# Patient Record
Sex: Male | Born: 1950 | Race: White | Hispanic: No | Marital: Married | Smoking: Former smoker
Health system: Southern US, Community
[De-identification: ages and names within clinical notes are randomized; demographics above are authoritative.]

## PROBLEM LIST (undated history)

## (undated) DIAGNOSIS — C3490 Malignant neoplasm of unspecified part of unspecified bronchus or lung: Secondary | ICD-10-CM

## (undated) DIAGNOSIS — I10 Essential (primary) hypertension: Secondary | ICD-10-CM

## (undated) DIAGNOSIS — Z7189 Other specified counseling: Secondary | ICD-10-CM

## (undated) DIAGNOSIS — E785 Hyperlipidemia, unspecified: Secondary | ICD-10-CM

## (undated) DIAGNOSIS — R7303 Prediabetes: Secondary | ICD-10-CM

## (undated) DIAGNOSIS — K219 Gastro-esophageal reflux disease without esophagitis: Secondary | ICD-10-CM

## (undated) DIAGNOSIS — R918 Other nonspecific abnormal finding of lung field: Secondary | ICD-10-CM

## (undated) HISTORY — PX: EYE SURGERY: SHX253

## (undated) HISTORY — DX: Prediabetes: R73.03

## (undated) HISTORY — DX: Gastro-esophageal reflux disease without esophagitis: K21.9

## (undated) HISTORY — DX: Other specified counseling: Z71.89

## (undated) HISTORY — DX: Essential (primary) hypertension: I10

## (undated) HISTORY — DX: Other nonspecific abnormal finding of lung field: R91.8

---

## 2017-07-31 DIAGNOSIS — E7800 Pure hypercholesterolemia, unspecified: Secondary | ICD-10-CM | POA: Insufficient documentation

## 2017-07-31 DIAGNOSIS — I10 Essential (primary) hypertension: Secondary | ICD-10-CM | POA: Insufficient documentation

## 2017-08-06 DIAGNOSIS — R7303 Prediabetes: Secondary | ICD-10-CM

## 2017-08-06 HISTORY — DX: Prediabetes: R73.03

## 2017-12-22 DIAGNOSIS — K219 Gastro-esophageal reflux disease without esophagitis: Secondary | ICD-10-CM | POA: Insufficient documentation

## 2017-12-22 DIAGNOSIS — Z01810 Encounter for preprocedural cardiovascular examination: Secondary | ICD-10-CM | POA: Insufficient documentation

## 2017-12-22 DIAGNOSIS — Z7185 Encounter for immunization safety counseling: Secondary | ICD-10-CM

## 2017-12-22 HISTORY — DX: Encounter for immunization safety counseling: Z71.85

## 2017-12-24 ENCOUNTER — Telehealth: Payer: Self-pay | Admitting: *Deleted

## 2017-12-24 DIAGNOSIS — Z87891 Personal history of nicotine dependence: Secondary | ICD-10-CM

## 2017-12-24 DIAGNOSIS — Z122 Encounter for screening for malignant neoplasm of respiratory organs: Secondary | ICD-10-CM

## 2017-12-24 NOTE — Telephone Encounter (Signed)
Received referral for initial lung cancer screening scan. Contacted patient and obtained smoking history,(former, quit 10/27/17, 50 pack year) as well as answering questions related to screening process. Patient denies signs of lung cancer such as weight loss or hemoptysis. Patient denies comorbidity that would prevent curative treatment if lung cancer were found. Patient is scheduled for shared decision making visit and CT scan on 01/12/18.

## 2017-12-25 DIAGNOSIS — C3490 Malignant neoplasm of unspecified part of unspecified bronchus or lung: Secondary | ICD-10-CM

## 2017-12-25 HISTORY — DX: Malignant neoplasm of unspecified part of unspecified bronchus or lung: C34.90

## 2018-01-12 ENCOUNTER — Ambulatory Visit
Admission: RE | Admit: 2018-01-12 | Discharge: 2018-01-12 | Disposition: A | Discharge status: Home / Self Care | Payer: BLUE CROSS/BLUE SHIELD | Source: Ambulatory Visit | Attending: Oncology | Admitting: Oncology

## 2018-01-12 ENCOUNTER — Inpatient Hospital Stay: Payer: BLUE CROSS/BLUE SHIELD | Attending: Oncology | Admitting: Oncology

## 2018-01-12 DIAGNOSIS — Z122 Encounter for screening for malignant neoplasm of respiratory organs: Secondary | ICD-10-CM | POA: Diagnosis present

## 2018-01-12 DIAGNOSIS — J438 Other emphysema: Secondary | ICD-10-CM | POA: Diagnosis not present

## 2018-01-12 DIAGNOSIS — I2510 Atherosclerotic heart disease of native coronary artery without angina pectoris: Secondary | ICD-10-CM | POA: Insufficient documentation

## 2018-01-12 DIAGNOSIS — I700 Atherosclerosis of aorta: Secondary | ICD-10-CM | POA: Insufficient documentation

## 2018-01-12 DIAGNOSIS — J439 Emphysema, unspecified: Secondary | ICD-10-CM | POA: Insufficient documentation

## 2018-01-12 DIAGNOSIS — R0602 Shortness of breath: Secondary | ICD-10-CM | POA: Insufficient documentation

## 2018-01-12 DIAGNOSIS — R918 Other nonspecific abnormal finding of lung field: Secondary | ICD-10-CM | POA: Insufficient documentation

## 2018-01-12 DIAGNOSIS — Z87891 Personal history of nicotine dependence: Secondary | ICD-10-CM | POA: Diagnosis present

## 2018-01-12 DIAGNOSIS — Z800 Family history of malignant neoplasm of digestive organs: Secondary | ICD-10-CM | POA: Insufficient documentation

## 2018-01-12 DIAGNOSIS — J432 Centrilobular emphysema: Secondary | ICD-10-CM | POA: Insufficient documentation

## 2018-01-12 DIAGNOSIS — K219 Gastro-esophageal reflux disease without esophagitis: Secondary | ICD-10-CM | POA: Insufficient documentation

## 2018-01-12 DIAGNOSIS — Z79899 Other long term (current) drug therapy: Secondary | ICD-10-CM | POA: Insufficient documentation

## 2018-01-12 DIAGNOSIS — R05 Cough: Secondary | ICD-10-CM | POA: Insufficient documentation

## 2018-01-12 DIAGNOSIS — I10 Essential (primary) hypertension: Secondary | ICD-10-CM | POA: Insufficient documentation

## 2018-01-12 NOTE — Progress Notes (Signed)
In accordance with CMS guidelines, patient has met eligibility criteria including age, absence of signs or symptoms of lung cancer.  Social History   Tobacco Use  . Smoking status: Not on file  Substance Use Topics  . Alcohol use: Not on file  . Drug use: Not on file     A shared decision-making session was conducted prior to the performance of CT scan. This includes one or more decision aids, includes benefits and harms of screening, follow-up diagnostic testing, over-diagnosis, false positive rate, and total radiation exposure.  Counseling on the importance of adherence to annual lung cancer LDCT screening, impact of co-morbidities, and ability or willingness to undergo diagnosis and treatment is imperative for compliance of the program.  Counseling on the importance of continued smoking cessation for former smokers; the importance of smoking cessation for current smokers, and information about tobacco cessation interventions have been given to patient including [place] and [place] quit [place] programs.  Written order for lung cancer screening with LDCT has been given to the patient and any and all questions have been answered to the best of my abilities.   Yearly follow up will be coordinated by Burgess Estelle, Thoracic Navigator.  Faythe Casa, NP 01/12/2018 9:46 AM

## 2018-01-13 ENCOUNTER — Other Ambulatory Visit: Payer: Self-pay | Admitting: *Deleted

## 2018-01-13 DIAGNOSIS — R918 Other nonspecific abnormal finding of lung field: Secondary | ICD-10-CM

## 2018-01-13 HISTORY — DX: Other nonspecific abnormal finding of lung field: R91.8

## 2018-01-14 ENCOUNTER — Encounter: Payer: Self-pay | Admitting: *Deleted

## 2018-01-14 ENCOUNTER — Other Ambulatory Visit: Payer: Self-pay | Admitting: *Deleted

## 2018-01-14 DIAGNOSIS — R911 Solitary pulmonary nodule: Secondary | ICD-10-CM

## 2018-01-14 NOTE — Progress Notes (Signed)
  Oncology Nurse Navigator Documentation  Navigator Location: [facility]Med Onc (01/14/18 1000) Referral date to RadOnc/MedOnc: 01/14/18 (01/14/18 1000) )Navigator Encounter Type: Introductory phone call (01/14/18 1000)   Abnormal Finding Date: 01/12/18 (01/14/18 1000)                   Treatment Phase: Abnormal Scans (01/14/18 1000) Barriers/Navigation Needs: Coordination of Care (01/14/18 1000)   Interventions: Coordination of Care (01/14/18 1000)   Coordination of Care: Appts (01/14/18 1000)        Acuity: Level 2 (01/14/18 1000)   Acuity Level 2: Initial guidance, education and coordination as needed;Educational needs;Assistance expediting appointments (01/14/18 1000)  phone call made to patient to introduce to navigator services and to give appt information that is scheduled on 3/22 at 9:45am. Pt instructed to arrive at the [facility] in [place] at 9:30am to register. Contact info given to patient and instructed to call with any further questions or needs. Pt verbalized understanding. Nothing further needed at this time.   Time Spent with Patient: 30 (01/14/18 1000)

## 2018-01-15 ENCOUNTER — Encounter: Payer: Self-pay | Admitting: Oncology

## 2018-01-15 ENCOUNTER — Encounter: Payer: Self-pay | Admitting: *Deleted

## 2018-01-15 ENCOUNTER — Inpatient Hospital Stay (HOSPITAL_BASED_OUTPATIENT_CLINIC_OR_DEPARTMENT_OTHER): Payer: BLUE CROSS/BLUE SHIELD | Admitting: Oncology

## 2018-01-15 ENCOUNTER — Other Ambulatory Visit: Payer: Self-pay

## 2018-01-15 VITALS — BP 121/74 | HR 75 | Temp 96.3°F | Resp 18 | Ht 68.5 in | Wt 158.8 lb

## 2018-01-15 DIAGNOSIS — Z79899 Other long term (current) drug therapy: Secondary | ICD-10-CM | POA: Diagnosis not present

## 2018-01-15 DIAGNOSIS — Z800 Family history of malignant neoplasm of digestive organs: Secondary | ICD-10-CM

## 2018-01-15 DIAGNOSIS — R0602 Shortness of breath: Secondary | ICD-10-CM

## 2018-01-15 DIAGNOSIS — R918 Other nonspecific abnormal finding of lung field: Secondary | ICD-10-CM

## 2018-01-15 DIAGNOSIS — J439 Emphysema, unspecified: Secondary | ICD-10-CM | POA: Diagnosis not present

## 2018-01-15 DIAGNOSIS — K219 Gastro-esophageal reflux disease without esophagitis: Secondary | ICD-10-CM

## 2018-01-15 DIAGNOSIS — Z87891 Personal history of nicotine dependence: Secondary | ICD-10-CM | POA: Diagnosis not present

## 2018-01-15 DIAGNOSIS — I10 Essential (primary) hypertension: Secondary | ICD-10-CM | POA: Diagnosis not present

## 2018-01-15 DIAGNOSIS — R05 Cough: Secondary | ICD-10-CM | POA: Diagnosis not present

## 2018-01-15 NOTE — Progress Notes (Signed)
Here for new pt evaluation. Pt has rarely seen a Dr over the years  He stated.

## 2018-01-15 NOTE — Progress Notes (Signed)
Hematology/Oncology Consult note [facility] [phone] [phone]   Patient Care Team: Dion Body, MD as PCP - General (Family Medicine) Telford Nab, RN as Registered Nurse  REFERRING PROVIDER: Dion Body, MD  Lung [place] CHIEF COMPLAINTS/PURPOSE OF CONSULTATION:  Evaluation of lung mass  HISTORY OF PRESENTING ILLNESS:  Joseph Barron is a  67 y.o.  male with PMH listed below who was referred to me for evaluation of lung mass.  Patient had CT chest lung cancer screening done on January 12, 2018 which showed there is a spiculated nodule with a volume derived mean diameter of 15.2 mm, which abuts the pleural surface, highly concerning for primary bronchogenic neoplasm.  He is former smoker, quit recently. 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  Reports mild shortness of breath with climbing stairs otherwise feeling well.  He has a chronic cough productive with clear sputum.  Denies any weight loss, headache, abdominal pain, lower extremity swelling.  Today he is accompanied by his daughter to clinic.  Patient's case was discussed on tumor conference on January 14, 2018.  Given his heavy smoking history/COPD/emphysema, risks excised for biopsy. Consensus decision was to obtain a PET scan, pulmonary lung function testing.  If his lung mass is hypermetabolic on PET scan and no other involvement, he will have surgical evaluation regarding possibilities of resection as well as obtaining diagnosis.   Review of Systems  Constitutional: Negative for chills, fever, malaise/fatigue and weight loss.  HENT: Negative for nosebleeds.   Eyes: Negative for pain.  Respiratory: Negative for cough and sputum production.   Cardiovascular: Positive for orthopnea and claudication.  Gastrointestinal: Positive for heartburn. Negative for abdominal pain and  vomiting.  Genitourinary: Negative for dysuria.  Musculoskeletal: Negative for myalgias and neck pain.  Skin: Negative for rash.  Neurological: Negative for dizziness, sensory change and headaches.  Endo/Heme/Allergies: Does not bruise/bleed easily.  Psychiatric/Behavioral: Negative for depression.    MEDICAL HISTORY:  Past Medical History:  Diagnosis Date  . GERD (gastroesophageal reflux disease)   . Hypertension     SURGICAL HISTORY: History reviewed. No pertinent surgical history.  SOCIAL HISTORY: Social History   Socioeconomic History  . Marital status: Married    Spouse name: Not on file  . Number of children: Not on file  . Years of education: Not on file  . Highest education level: Not on file  Occupational History  . Not on file  Social Needs  . Financial resource strain: Not on file  . Food insecurity:    Worry: Not on file    Inability: Not on file  . Transportation needs:    Medical: Not on file    Non-medical: Not on file  Tobacco Use  . Smoking status: Former Smoker    Packs/day: 1.00    Years: 50.00    Pack years: 50.00    Types: Cigarettes    Last attempt to quit: 11/17/2017    Years since quitting: 0.1  . Smokeless tobacco: Former Systems developer    Types: [place] date: 01/15/1989  Substance and Sexual Activity  . Alcohol use: Yes    Comment: used to drink 1/5 th liquor x 2 y- now occasional beer- socially   . Drug use: Never  . Sexual activity: Yes  Lifestyle  . Physical activity:    Days per week: Not on file  Minutes per session: Not on file  . Stress: Not on file  Relationships  . Social connections:    Talks on phone: Not on file    Gets together: Not on file    Attends religious service: Not on file    Active member of club or organization: Not on file    Attends meetings of clubs or organizations: Not on file    Relationship status: Not on file  . Intimate partner violence:    Fear of current or ex partner: Not on file     Emotionally abused: Not on file    Physically abused: Not on file    Forced sexual activity: Not on file  Other Topics Concern  . Not on file  Social History Narrative  . Not on file    FAMILY HISTORY: Family History  Problem Relation Age of Onset  . Arthritis Mother   . COPD Father   . Coronary artery disease Father   . Transient ischemic attack Father   . Alcoholism Father   . Bowel Disease Father   . COPD Sister   . Prostatitis Brother   . Thyroid disease Sister   . Thyroid disease Sister   . Liver cancer Brother   . Diabetes Brother     ALLERGIES:  has No Known Allergies.  MEDICATIONS:  Current Outpatient Medications  Medication Sig Dispense Refill  . atorvastatin (LIPITOR) 10 MG tablet Take 10 mg by mouth daily at 6 PM.     . lisinopril (PRINIVIL,ZESTRIL) 20 MG tablet Take 20 mg by mouth daily.     Marland Kitchen omeprazole (PRILOSEC) 20 MG capsule Take 20 mg by mouth daily.     . varenicline (CHANTIX CONTINUING MONTH PAK) 1 MG tablet Take by mouth.     No current facility-administered medications for this visit.      PHYSICAL EXAMINATION: ECOG PERFORMANCE STATUS: 0 - Asymptomatic Vitals:   01/15/18 0948  BP: 121/74  Pulse: 75  Resp: 18  Temp: (!) 96.3 F (35.7 C)   Filed Weights   01/15/18 0948  Weight: 158 lb 12.8 oz (72 kg)    Physical Exam  Constitutional: He is oriented to person, place, and time and well-developed, well-nourished, and in no distress. No distress.  HENT:  Head: Normocephalic and atraumatic.  Mouth/Throat: No oropharyngeal exudate.  Eyes: Pupils are equal, round, and reactive to light. EOM are normal. Left eye exhibits no discharge.  Neck: Normal range of motion. Neck supple.  Cardiovascular: Normal rate and regular rhythm.  No murmur heard. Pulmonary/Chest: Effort normal.  Decreased breath sound bilaterally.  Abdominal: Bowel sounds are normal. He exhibits no distension.  Musculoskeletal: He exhibits no edema.  Lymphadenopathy:    He  has no cervical adenopathy.  Neurological: He is alert and oriented to person, place, and time. No cranial nerve deficit.  Skin: Skin is warm and dry.  Psychiatric: Affect and judgment normal.     LABORATORY DATA:  I have reviewed the data as listed No results found for: WBC, HGB, HCT, MCV, PLT No results for input(s): NA, K, CL, CO2, GLUCOSE, BUN, CREATININE, CALCIUM, GFRNONAA, GFRAA, PROT, ALBUMIN, AST, ALT, ALKPHOS, BILITOT, BILIDIR, IBILI in the last 8760 hours.     ASSESSMENT & PLAN:  1. Lung mass    Image results were discussed with patient and his daughter.  Discuss about the rationale of obtaining a PET scan in the pulmonary lung function.  Refer to Dr. Genevive Bi for evaluation of surgical resection. Encourage patient  to continue smoke cessation. All questions were answered. The patient knows to call the clinic with any problems questions or concerns.  Return of visit: To be determined depending on image results.  RN navigator will coordinate. Thank you for this kind referral and the opportunity to participate in the care of this patient. A copy of today's note is routed to referring provider    Earlie Server, MD, PhD Hematology Oncology [facility] Pager- [phone] 01/15/2018

## 2018-01-15 NOTE — Progress Notes (Signed)
  Oncology Nurse Navigator Documentation  Navigator Location: [facility]-Med Onc (01/15/18 1400)   )Navigator Encounter Type: Initial MedOnc (01/15/18 1400)                       Treatment Phase: Abnormal Scans (01/15/18 1400) Barriers/Navigation Needs: Coordination of Care (01/15/18 1400)   Interventions: Coordination of Care (01/15/18 1400)   Coordination of Care: Appts;Radiology (01/15/18 1400)         met with patient during initial med-onc consultation with Dr. Tasia Catchings to review results of recent low-dose CT scan and to discuss next steps. All questions answered at the time of visit. Reviewed upcoming appts with patient. Contact info given and instructed to call with any further questions or needs. Pt verbalized understanding. Nothing further needed.         Time Spent with Patient: 60 (01/15/18 1400)

## 2018-01-19 ENCOUNTER — Ambulatory Visit: Payer: BLUE CROSS/BLUE SHIELD | Attending: Oncology

## 2018-01-19 DIAGNOSIS — R918 Other nonspecific abnormal finding of lung field: Secondary | ICD-10-CM

## 2018-01-19 DIAGNOSIS — J449 Chronic obstructive pulmonary disease, unspecified: Secondary | ICD-10-CM | POA: Diagnosis present

## 2018-01-19 LAB — BLOOD GAS, ARTERIAL
Acid-Base Excess: 0.7 mmol/L (ref 0.0–2.0)
Bicarbonate: 26 mmol/L (ref 20.0–28.0)
FIO2: 0.21
O2 SAT: 95.1 %
PATIENT TEMPERATURE: 37
pCO2 arterial: 43 mmHg (ref 32.0–48.0)
pH, Arterial: 7.39 (ref 7.350–7.450)
pO2, Arterial: 77 mmHg — ABNORMAL LOW (ref 83.0–108.0)

## 2018-01-20 ENCOUNTER — Ambulatory Visit
Admission: RE | Admit: 2018-01-20 | Discharge: 2018-01-20 | Disposition: A | Discharge status: Home / Self Care | Payer: BLUE CROSS/BLUE SHIELD | Source: Ambulatory Visit | Attending: Oncology | Admitting: Oncology

## 2018-01-20 DIAGNOSIS — J439 Emphysema, unspecified: Secondary | ICD-10-CM | POA: Diagnosis not present

## 2018-01-20 DIAGNOSIS — R918 Other nonspecific abnormal finding of lung field: Secondary | ICD-10-CM | POA: Diagnosis present

## 2018-01-20 LAB — GLUCOSE, CAPILLARY: Glucose-Capillary: 100 mg/dL — ABNORMAL HIGH (ref 65–99)

## 2018-01-20 MED ORDER — FLUDEOXYGLUCOSE F - 18 (FDG) INJECTION
8.2000 | Freq: Once | INTRAVENOUS | Status: AC | PRN
  Administered 2018-01-20: 8.08 via INTRAVENOUS

## 2018-01-22 ENCOUNTER — Encounter: Payer: Self-pay | Admitting: Cardiothoracic Surgery

## 2018-01-22 ENCOUNTER — Ambulatory Visit (INDEPENDENT_AMBULATORY_CARE_PROVIDER_SITE_OTHER): Payer: BLUE CROSS/BLUE SHIELD | Admitting: Cardiothoracic Surgery

## 2018-01-22 ENCOUNTER — Telehealth: Payer: Self-pay

## 2018-01-22 VITALS — BP 163/78 | HR 76 | Temp 97.7°F | Resp 18 | Ht 68.5 in | Wt 158.8 lb

## 2018-01-22 DIAGNOSIS — R918 Other nonspecific abnormal finding of lung field: Secondary | ICD-10-CM | POA: Diagnosis not present

## 2018-01-22 NOTE — Telephone Encounter (Signed)
Cardiac clearance faxed to Dr.Paraschos at this time. Patient has scheduled appointment 01/25/18 @ 2:45 pm.

## 2018-01-22 NOTE — Telephone Encounter (Signed)
Left message for specialty scheduling to call to schedule CT lung guided biopsy.

## 2018-01-22 NOTE — Patient Instructions (Signed)
I will call you later today or Monday with appointment information. We will send referrals to Cardiology and Gastroenterology.  If its going to be more than 1 week of getting you appointments at the above facilities then I will reach out to other offices.    If you have questions or concerns please call our office.

## 2018-01-22 NOTE — Progress Notes (Signed)
Patient ID: Joseph Barron, male   DOB: 07-05-1951, 67 y.o.   MRN: [id number]  Chief Complaint  Patient presents with  . New Patient (Initial Visit)    Lung nodule0referred by [place]    Referred By Dr. Netty Starring Reason for Referral right upper lobe mass  HPI Location, Quality, Duration, Severity, Timing, Context, Modifying Factors, Associated Signs and Symptoms.  Joseph Barron is a 67 y.o. male.  He is a heavy smoker in the past having smoked for about 50 years 1 pack/day.  He quit 2 months ago.  When he saw his primary care physician he obtained a CT scan of the chest for lung cancer screening.  The CT scan was abnormal showing a right upper lobe mass and a subsequent PET scan was performed.  The PET scan revealed 2 abnormalities.  One was in the right upper lobe with a 2 cm spiculated lesion most consistent with a malignancy.  The other lesion was in the sigmoid colon.  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  Patient is referred to me for further evaluation and workup.  He states that since he quit smoking 2 months ago he is gained about 20 pounds.  He works as a Theme park manager and states that he is able to perform all of his duties without significant shortness of breath.  He had some pulmonary function studies done revealing an FEV1 of 78% but a DLCO of only 42%.  He has not had a biopsy.  He has had no other pulmonary symptoms.  He has an occasional cough and occasional sputum production but no hemoptysis.  He does not have a family history of lung cancer.  He does have a family history of cardiovascular disease.  He has no GI symptoms.   Past Medical History:  Diagnosis Date  . 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 GERD (gastroesophageal reflux disease)   . Hypertension   . Pulmonary nodules 01/13/2018  . Vaccine counseling 12/22/2017    Past Surgical History:  Procedure Laterality Date  . EYE SURGERY Left child    Family History  Problem Relation Age of Onset   . Arthritis Mother   . COPD Father   . Coronary artery disease Father   . Transient ischemic attack Father   . Alcoholism Father   . Bowel Disease Father   . COPD Sister   . Prostatitis Brother   . Thyroid disease Sister   . Thyroid disease Sister   . Liver cancer Brother   . Diabetes Brother     Social History Social History   Tobacco Use  . Smoking status: Former Smoker    Packs/day: 1.00    Years: 50.00    Pack years: 50.00    Types: Cigarettes    Last attempt to quit: 11/17/2017    Years since quitting: 0.1  . Smokeless tobacco: Former Systems developer    Types: Chew    Quit date: 01/15/1989  Substance Use Topics  . Alcohol use: Yes    Comment: used to drink 1/5 th liquor x 2 y- now occasional beer- socially   . Drug use: Never    No Known Allergies  Current Outpatient Medications  Medication Sig Dispense Refill  . atorvastatin (LIPITOR) 10 MG tablet Take 10 mg by mouth daily at 6 PM.     . lisinopril (PRINIVIL,ZESTRIL) 20 MG tablet Take 20 mg by mouth daily.     Marland Kitchen omeprazole (PRILOSEC) 20 MG capsule Take 20 mg by  mouth daily.     . varenicline (CHANTIX CONTINUING MONTH PAK) 1 MG tablet Take by mouth.     No current facility-administered medications for this visit.       Review of Systems A complete review of systems was asked and was negative except for the following positive findings occasional shortness of breath and heartburn  Blood pressure (!) 163/78, pulse 76, temperature 97.7 F (36.5 C), temperature source Oral, resp. rate 18, height 5' 8.5" (1.74 m), weight 158 lb 12.8 oz (72 kg), SpO2 99 %.  Physical Exam CONSTITUTIONAL:  Pleasant, well-developed, well-nourished, and in no acute distress. EYES: Pupils equal and reactive to light, Sclera non-icteric EARS, NOSE, MOUTH AND THROAT:  The oropharynx was clear.  Dentition is absent.  Oral mucosa pink and moist. LYMPH NODES:  Lymph nodes in the neck and axillae were normal RESPIRATORY:  Lungs were distant with  bibasilar Velcro rales.  Normal respiratory effort without pathologic use of accessory muscles of respiration CARDIOVASCULAR: Heart was regular without murmurs.  There were no carotid bruits. GI: The abdomen was soft, nontender, and nondistended. There were no palpable masses. There was no hepatosplenomegaly. There were normal bowel sounds in all quadrants. GU:  Rectal deferred.   MUSCULOSKELETAL:  Normal muscle strength and tone.  No cyanosis.  There is mild clubbing SKIN:  There were no pathologic skin lesions.  There were no nodules on palpation. NEUROLOGIC:  Sensation is normal.  Cranial nerves are grossly intact. PSYCH:  Oriented to person, place and time.  Mood and affect are normal.  Data Reviewed CT scan and PET scan  I have personally reviewed the patient's imaging, laboratory findings and medical records.    Assessment    I have independently reviewed the patient's CT scan and PET scan.  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    Plan    I had a long discussion today with he and his daughter.  We reviewed the options.  His CT scan shows significant emphysema scattered throughout the lungs as well as some honeycombing peripherally suggestive of fibrotic lung disease as well.  I explained to him the indications and risks of the various treatments for surgery including wedge resection or lobectomy.  I also explained to him that his sigmoid colon likewise required assessment.  After an extensive discussion regarding the indications and risks of the various treatment options including the risks of surgery of bleeding, infection, air leak and death I have recommended that he undergo colonoscopy at this time.  We will attempt to set him up with our gastroenterologist.  I would also like him seen by our cardiologist for clearance prior to any intervention.  In addition he would like to pursue a CT-guided needle biopsy.  The  indications and risks for that procedure were also discussed in great detail.  I will see the patient back again in 1 week.       Nestor Lewandowsky, MD 01/22/2018, 9:44 AM

## 2018-01-22 NOTE — Telephone Encounter (Signed)
Patient notified of Cardiology appointment below.  Dr.Paraschos 01/25/18 @ 2:45 pm

## 2018-01-22 NOTE — Addendum Note (Signed)
Addended by: Celene Kras on: 01/22/2018 09:56 AM   Modules accepted: Orders

## 2018-01-25 ENCOUNTER — Telehealth: Payer: Self-pay

## 2018-01-25 NOTE — Telephone Encounter (Signed)
Left message for patient to return call.   Dr.Paraschos- 01/25/18 @ 2:45 [facility] GI- 02/01/18 @ 2:30 pm  Ct Lung guided biopsy- sent for review

## 2018-01-25 NOTE — Telephone Encounter (Signed)
An order to [facility] for a CT Lung Guided Biopsy was faxed to [phone]. Awaiting for Nydia Bouton to call me back with an appointment date and time. She calls the patient to schedule.

## 2018-01-27 ENCOUNTER — Telehealth: Payer: Self-pay | Admitting: Cardiothoracic Surgery

## 2018-01-27 NOTE — Telephone Encounter (Signed)
We have received patients medical clearance, medical clearance has been placed in patients chart.

## 2018-01-27 NOTE — Telephone Encounter (Signed)
Patient notified of 02/08/18 @ 9:30 am appointment. Patient stated he has disc with xray from several years ago and will bring it to office.

## 2018-01-29 ENCOUNTER — Ambulatory Visit
Admission: RE | Admit: 2018-01-29 | Discharge: 2018-01-29 | Disposition: A | Discharge status: Home / Self Care | Payer: Self-pay | Source: Ambulatory Visit | Attending: Cardiothoracic Surgery | Admitting: Cardiothoracic Surgery

## 2018-01-29 ENCOUNTER — Other Ambulatory Visit: Payer: Self-pay | Admitting: Cardiothoracic Surgery

## 2018-01-29 DIAGNOSIS — R918 Other nonspecific abnormal finding of lung field: Secondary | ICD-10-CM

## 2018-02-02 NOTE — Telephone Encounter (Signed)
Patient's CT Guided Lung Biopsy is scheduled to be on 02/03/2018. Once we have results, he will need to come back to see Dr. Genevive Bi on 02/08/2018.

## 2018-02-04 ENCOUNTER — Other Ambulatory Visit: Payer: Self-pay

## 2018-02-08 ENCOUNTER — Inpatient Hospital Stay: Payer: BLUE CROSS/BLUE SHIELD | Attending: Oncology | Admitting: Oncology

## 2018-02-08 ENCOUNTER — Encounter: Payer: Self-pay | Admitting: *Deleted

## 2018-02-08 ENCOUNTER — Ambulatory Visit: Payer: Self-pay | Admitting: Cardiothoracic Surgery

## 2018-02-08 ENCOUNTER — Encounter: Payer: Self-pay | Admitting: Oncology

## 2018-02-08 ENCOUNTER — Inpatient Hospital Stay: Payer: BLUE CROSS/BLUE SHIELD | Admitting: Oncology

## 2018-02-08 VITALS — BP 133/80 | HR 65 | Temp 97.7°F | Resp 18 | Wt 159.6 lb

## 2018-02-08 DIAGNOSIS — Z79899 Other long term (current) drug therapy: Secondary | ICD-10-CM

## 2018-02-08 DIAGNOSIS — I700 Atherosclerosis of aorta: Secondary | ICD-10-CM | POA: Diagnosis not present

## 2018-02-08 DIAGNOSIS — K219 Gastro-esophageal reflux disease without esophagitis: Secondary | ICD-10-CM | POA: Diagnosis not present

## 2018-02-08 DIAGNOSIS — C7A8 Other malignant neuroendocrine tumors: Secondary | ICD-10-CM | POA: Insufficient documentation

## 2018-02-08 DIAGNOSIS — Z87891 Personal history of nicotine dependence: Secondary | ICD-10-CM | POA: Diagnosis not present

## 2018-02-08 DIAGNOSIS — J439 Emphysema, unspecified: Secondary | ICD-10-CM

## 2018-02-08 DIAGNOSIS — I10 Essential (primary) hypertension: Secondary | ICD-10-CM | POA: Diagnosis not present

## 2018-02-08 DIAGNOSIS — R948 Abnormal results of function studies of other organs and systems: Secondary | ICD-10-CM | POA: Diagnosis not present

## 2018-02-08 DIAGNOSIS — C3491 Malignant neoplasm of unspecified part of right bronchus or lung: Secondary | ICD-10-CM | POA: Diagnosis present

## 2018-02-08 DIAGNOSIS — Z7189 Other specified counseling: Secondary | ICD-10-CM | POA: Insufficient documentation

## 2018-02-08 DIAGNOSIS — I739 Peripheral vascular disease, unspecified: Secondary | ICD-10-CM

## 2018-02-08 DIAGNOSIS — C7A1 Malignant poorly differentiated neuroendocrine tumors: Secondary | ICD-10-CM | POA: Insufficient documentation

## 2018-02-08 DIAGNOSIS — Z800 Family history of malignant neoplasm of digestive organs: Secondary | ICD-10-CM

## 2018-02-08 NOTE — Progress Notes (Signed)
Pt in for follow up for biopsy results.  Reports still spitting up blood since biopsy.  States quit taking chantix but has not smoked  'doing it on my own".

## 2018-02-08 NOTE — Progress Notes (Signed)
  Oncology Nurse Navigator Documentation  Navigator Location: [facility]Med Onc (02/08/18 1300)   )Navigator Encounter Type: Diagnostic Results;MDC Follow-up (02/08/18 1300)   Abnormal Finding Date: 01/13/18 (02/08/18 1300) Confirmed Diagnosis Date: 02/05/18 (02/08/18 1300)               Patient Visit Type: MedOnc (02/08/18 1300) Treatment Phase: Pre-Tx/Tx Discussion (02/08/18 1300) Barriers/Navigation Needs: Coordination of Care;Education (02/08/18 1300) Education: Understanding Cancer/ Treatment Options;Newly Diagnosed Cancer Education (02/08/18 1300) Interventions: Coordination of Care (02/08/18 1300)   Coordination of Care: Appts (02/08/18 1300)         met with patient during follow up visit with Dr. Tasia Catchings to discuss results from recent biopsy and review treatment options. All questions answered at the time of visit. Pt given resources regarding diagnosis and supportive services available. Reviewed with patient upcoming appts. Informed us that appt with Dr. Genevive Bi is scheduled on 4/25 and colonoscopy is scheduled on 4/29. Pt questions if those appts need to be scheduled sooner. Pt informed that will send message to Dr. Genevive Bi staff to see if appts need to be rescheduled sooner. Spoke with Herb Grays who will discuss with Dr. Genevive Bi when back in the clinic. Herb Grays stated that she will inform the patient if appts need to be moved. Pt instructed to keep appts as scheduled unless hears back from Dr. Genevive Bi office. Instructed pt to call with any further questions or needs. Pt verbalized understanding.          Time Spent with Patient: 60 (02/08/18 1300)

## 2018-02-08 NOTE — Progress Notes (Deleted)
Hematology/Oncology Consult note [facility] Telephone:([phone] Fax:([phone]   Patient Care Team: Dion Body, MD as PCP - General (Family Medicine) Telford Nab, RN as Registered Nurse  REFERRING PROVIDER: Dion Body, MD  Lung [place] CHIEF COMPLAINTS/PURPOSE OF CONSULTATION:  Evaluation of lung mass  HISTORY OF PRESENTING ILLNESS:  Joseph Barron is a  67 y.o.  male with PMH listed below who was referred to me for evaluation of lung mass.  Patient had CT chest lung cancer screening done on January 12, 2018 which showed there is a spiculated nodule with a volume derived mean diameter of 15.2 mm, which abuts the pleural surface, highly concerning for primary bronchogenic neoplasm.  He is former smoker, quit recently. 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  Reports mild shortness of breath with climbing stairs otherwise feeling well.  He has a chronic cough productive with clear sputum.  Denies any weight loss, headache, abdominal pain, lower extremity swelling.  Today he is accompanied by his daughter to clinic.  Patient's case was discussed on tumor conference on January 14, 2018.  Given his heavy smoking history/COPD/emphysema, risks excised for biopsy. Consensus decision was to obtain a PET scan, pulmonary lung function testing.  If his lung mass is hypermetabolic on PET scan and no other involvement, he will have surgical evaluation regarding possibilities of resection as well as obtaining diagnosis.   INTERVAL HISTORY Joseph Barron is a 67 y.o. male who has above history reviewed by me today presents for follow up visit for management of lung mass.  PET scan  1. 15.2 mm nodule of concern on recent lung cancer screening CT shows no appreciable change in the 8 day interval since that exam. As such, low level hypermetabolism on today's PET  study is more likely related to neoplastic etiology than infection/inflammation. No evidence for hypermetabolic hilar or mediastinal lymph nodes. 2. Focal hypermetabolic FDG accumulation in the distal sigmoid colon with really no marked FDG uptake in the colon elsewhere. CT imaging shows an apparent focal soft tissue lesion at this location. Advanced adenoma or neoplasm could have this appearance. Correlation with colorectal cancer screening history recommended and colonoscopy may be warranted. 3.  Emphysema. (ICD10-J43.9) 4.  Aortic Atherosclerois (ICD10-170.0)   Review of Systems  Constitutional: Negative for chills, fever, malaise/fatigue and weight loss.  HENT: Negative for nosebleeds.   Eyes: Negative for pain.  Respiratory: Negative for cough and sputum production.   Cardiovascular: Positive for orthopnea and claudication.  Gastrointestinal: Positive for heartburn. Negative for abdominal pain and vomiting.  Genitourinary: Negative for dysuria.  Musculoskeletal: Negative for myalgias and neck pain.  Skin: Negative for rash.  Neurological: Negative for dizziness, sensory change and headaches.  Endo/Heme/Allergies: Does not bruise/bleed easily.  Psychiatric/Behavioral: Negative for depression.    MEDICAL HISTORY:  Past Medical History:  Diagnosis Date  . 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 GERD (gastroesophageal reflux disease)   . Hypertension   . Pulmonary nodules 01/13/2018  . Vaccine counseling 12/22/2017    SURGICAL HISTORY: Past Surgical History:  Procedure Laterality Date  . EYE SURGERY Left child    SOCIAL HISTORY: Social History   Socioeconomic History  . Marital status: Married    Spouse name: Not on file  . Number of children: Not on file  . Years of education: Not on file  . Highest education level: Not on file  Occupational History  . Not on file  Social Needs  . Financial resource strain: Not on file  . Food insecurity:    Worry: Not on file     Inability: Not on file  . Transportation needs:    Medical: Not on file    Non-medical: Not on file  Tobacco Use  . Smoking status: Former Smoker    Packs/day: 1.00    Years: 50.00    Pack years: 50.00    Types: Cigarettes    Last attempt to quit: 11/17/2017    Years since quitting: 0.2  . Smokeless tobacco: Former Systems developer    Types: [place] date: 01/15/1989  Substance and Sexual Activity  . Alcohol use: Yes    Comment: used to drink 1/5 th liquor x 2 y- now occasional beer- socially   . Drug use: Never  . Sexual activity: Yes  Lifestyle  . Physical activity:    Days per week: Not on file    Minutes per session: Not on file  . Stress: Not on file  Relationships  . Social connections:    Talks on phone: Not on file    Gets together: Not on file    Attends religious service: Not on file    Active member of club or organization: Not on file    Attends meetings of clubs or organizations: Not on file    Relationship status: Not on file  . Intimate partner violence:    Fear of current or ex partner: Not on file    Emotionally abused: Not on file    Physically abused: Not on file    Forced sexual activity: Not on file  Other Topics Concern  . Not on file  Social History Narrative  . Not on file    FAMILY HISTORY: Family History  Problem Relation Age of Onset  . Arthritis Mother   . COPD Father   . Coronary artery disease Father   . Transient ischemic attack Father   . Alcoholism Father   . Bowel Disease Father   . COPD Sister   . Prostatitis Brother   . Thyroid disease Sister   . Thyroid disease Sister   . Liver cancer Brother   . Diabetes Brother     ALLERGIES:  has No Known Allergies.  MEDICATIONS:  Current Outpatient Medications  Medication Sig Dispense Refill  . atorvastatin (LIPITOR) 10 MG tablet Take 10 mg by mouth daily at 6 PM.     . lisinopril (PRINIVIL,ZESTRIL) 20 MG tablet Take 20 mg by mouth daily.     . metoprolol succinate (TOPROL-XL) 25  MG 24 hr tablet Take by mouth.    Marland Kitchen omeprazole (PRILOSEC) 20 MG capsule Take 20 mg by mouth daily.     . varenicline (CHANTIX CONTINUING MONTH PAK) 1 MG tablet Take by mouth.     No current facility-administered medications for this visit.      PHYSICAL EXAMINATION: ECOG PERFORMANCE STATUS: 0 - Asymptomatic There were no vitals filed for this visit. There were no vitals filed for this visit.  Physical Exam  Constitutional: He is oriented to person, place, and time and well-developed, well-nourished, and in no distress. No distress.  HENT:  Head: Normocephalic and atraumatic.  Mouth/Throat: No oropharyngeal exudate.  Eyes: Pupils are equal, round, and reactive to light. EOM are normal. Left eye exhibits no discharge.  Neck: Normal range of motion. Neck supple.  Cardiovascular: Normal rate and regular rhythm.  No murmur  heard. Pulmonary/Chest: Effort normal.  Decreased breath sound bilaterally.  Abdominal: Bowel sounds are normal. He exhibits no distension.  Musculoskeletal: He exhibits no edema.  Lymphadenopathy:    He has no cervical adenopathy.  Neurological: He is alert and oriented to person, place, and time. No cranial nerve deficit.  Skin: Skin is warm and dry.  Psychiatric: Affect and judgment normal.     LABORATORY DATA:  I have reviewed the data as listed No results found for: WBC, HGB, HCT, MCV, PLT No results for input(s): NA, K, CL, CO2, GLUCOSE, BUN, CREATININE, CALCIUM, GFRNONAA, GFRAA, PROT, ALBUMIN, AST, ALT, ALKPHOS, BILITOT, BILIDIR, IBILI in the last 8760 hours.     ASSESSMENT & PLAN:  No diagnosis found. Image results were discussed with patient and his daughter.  Discuss about the rationale of obtaining a PET scan in the pulmonary lung function.  Refer to Dr. Genevive Bi for evaluation of surgical resection. Encourage patient to continue smoke cessation. All questions were answered. The patient knows to call the clinic with any problems questions or  concerns.  Return of visit: To be determined depending on image results.  RN navigator will coordinate. Thank you for this kind referral and the opportunity to participate in the care of this patient. A copy of today's note is routed to referring provider    Earlie Server, MD, PhD Hematology Oncology [facility] Pager- [phone] 02/08/2018

## 2018-02-08 NOTE — Progress Notes (Signed)
Hematology/Oncology Follow up note [facility] Telephone:([phone] Fax:(336) [phone]   Patient Care Team: Dion Body, MD as PCP - General (Family Medicine) Telford Nab, RN as Registered Nurse  REFERRING PROVIDER: Dion Body, MD  Lung [place] VISIT Follow up for lung adenocarcinoma.  HISTORY OF PRESENTING ILLNESS:  Joseph Barron is a  67 y.o.  male with PMH listed below who was referred to me for evaluation of lung mass.  Patient had CT chest lung cancer screening done on January 12, 2018 which showed there is a spiculated nodule with a volume derived mean diameter of 15.2 mm, which abuts the pleural surface, highly concerning for primary bronchogenic neoplasm.  He is former smoker, quit recently. 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  Reports mild shortness of breath with climbing stairs otherwise feeling well.  He has a chronic cough productive with clear sputum.  Denies any weight loss, headache, abdominal pain, lower extremity swelling.  Today he is accompanied by his daughter to clinic.  Patient's case was discussed on tumor conference on January 14, 2018.  Given his heavy smoking history/COPD/emphysema, risks excised for biopsy. Consensus decision was to obtain a PET scan, pulmonary lung function testing.  If his lung mass is hypermetabolic on PET scan and no other involvement, he will have surgical evaluation regarding possibilities of resection as well as obtaining diagnosis.  # PET scan  1. 15.2 mm nodule of concern on recent lung cancer screening CT shows no appreciable change in the 8 day interval since that exam. As such, low level hypermetabolism on today's PET study is more likely related to neoplastic etiology than infection/inflammation. No evidence for hypermetabolic hilar or mediastinal lymph nodes. 2. Focal hypermetabolic  FDG accumulation in the distal sigmoid colon with really no marked FDG uptake in the colon elsewhere. CT imaging shows an apparent focal soft tissue lesion at this location. Advanced adenoma or neoplasm could have this appearance. Correlation with colorectal cancer screening history recommended and colonoscopy may be warranted. 3.  Emphysema. (ICD10-J43.9) 4.  Aortic Atherosclerois (ICD10-170.0)   INTERVAL HISTORY Joseph Barron is a 67 y.o. male who has above history reviewed by me today presents for follow up visit for management of lung cancer.   During interval patient has been seen and evaluated by Dr. Genevive Bi.  He has underwent CT-guided biopsy and today he presents to discuss about pathology reports.  Patient has no new complaints today.  Denies any shortness of breath, chest pain, abdominal pain, headache.  He asked if he can go back to work. # Biopsy was done at [place]  On 02/03/2018.  A.Right Lung, Fine Needle Aspiration II (smears and cell block):  Non-small cell carcinoma with histologic changes and immunohistochemical profile consistent with lung adenocarcinoma, moderately differentiated.   Specimen Adequacy:Satisfactory for evaluation.        Note: Floaters identified on cell block.  B.Cytology core biopsy:  Non-small cell carcinoma with histologic changes and  immunohistochemical profile consistent with lung adenocarcinoma,  moderately differentiated.    Review of Systems  Constitutional: Negative for chills, fever, malaise/fatigue and weight loss.  HENT: Negative for nosebleeds.   Eyes: Negative for pain.  Respiratory: Negative for cough and sputum production.   Cardiovascular: Positive for orthopnea and claudication.  Gastrointestinal: Positive for heartburn. Negative for abdominal pain and vomiting.  Genitourinary: Negative for dysuria.  Musculoskeletal: Negative  for myalgias and neck pain.  Skin: Negative for rash.  Neurological: Negative  for dizziness, sensory change and headaches.  Endo/Heme/Allergies: Does not bruise/bleed easily.  Psychiatric/Behavioral: Negative for depression.    MEDICAL HISTORY:  Past Medical History:  Diagnosis Date  . 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 GERD (gastroesophageal reflux disease)   . Hypertension   . Pulmonary nodules 01/13/2018  . Vaccine counseling 12/22/2017    SURGICAL HISTORY: Past Surgical History:  Procedure Laterality Date  . EYE SURGERY Left child    SOCIAL HISTORY: Social History   Socioeconomic History  . Marital status: Married    Spouse name: Not on file  . Number of children: Not on file  . Years of education: Not on file  . Highest education level: Not on file  Occupational History  . Not on file  Social Needs  . Financial resource strain: Not on file  . Food insecurity:    Worry: Not on file    Inability: Not on file  . Transportation needs:    Medical: Not on file    Non-medical: Not on file  Tobacco Use  . Smoking status: Former Smoker    Packs/day: 1.00    Years: 50.00    Pack years: 50.00    Types: Cigarettes    Last attempt to quit: 11/17/2017    Years since quitting: 0.2  . Smokeless tobacco: Former Systems developer    Types: [place] date: 01/15/1989  Substance and Sexual Activity  . Alcohol use: Yes    Comment: used to drink 1/5 th liquor x 2 y- now occasional beer- socially   . Drug use: Never  . Sexual activity: Yes  Lifestyle  . Physical activity:    Days per week: Not on file    Minutes per session: Not on file  . Stress: Not on file  Relationships  . Social connections:    Talks on phone: Not on file    Gets together: Not on file    Attends religious service: Not on file    Active member of club or organization: Not on file    Attends meetings of clubs or organizations: Not on file    Relationship status: Not on file  . Intimate partner violence:    Fear of current or ex partner: Not on file    Emotionally abused: Not  on file    Physically abused: Not on file    Forced sexual activity: Not on file  Other Topics Concern  . Not on file  Social History Narrative  . Not on file    FAMILY HISTORY: Family History  Problem Relation Age of Onset  . Arthritis Mother   . COPD Father   . Coronary artery disease Father   . Transient ischemic attack Father   . Alcoholism Father   . Bowel Disease Father   . COPD Sister   . Prostatitis Brother   . Thyroid disease Sister   . Thyroid disease Sister   . Liver cancer Brother   . Diabetes Brother     ALLERGIES:  has No Known Allergies.  MEDICATIONS:  Current Outpatient Medications  Medication Sig Dispense Refill  . atorvastatin (LIPITOR) 10 MG tablet Take 10 mg by mouth daily at 6 PM.     . lisinopril (PRINIVIL,ZESTRIL) 20 MG tablet Take 20 mg by mouth daily.     . metoprolol succinate (TOPROL-XL) 25 MG 24 hr tablet Take by mouth.    Marland Kitchen omeprazole (  PRILOSEC) 20 MG capsule Take 20 mg by mouth daily.     . varenicline (CHANTIX CONTINUING MONTH PAK) 1 MG tablet Take by mouth.     No current facility-administered medications for this visit.      PHYSICAL EXAMINATION: ECOG PERFORMANCE STATUS: 0 - Asymptomatic Vitals:   02/08/18 1005  BP: 133/80  Pulse: 65  Resp: 18  Temp: 97.7 F (36.5 C)  SpO2: 98%   Filed Weights   02/08/18 1005  Weight: 159 lb 9 oz (72.4 kg)    Physical Exam  Constitutional: He is oriented to person, place, and time and well-developed, well-nourished, and in no distress. No distress.  HENT:  Head: Normocephalic and atraumatic.  Mouth/Throat: No oropharyngeal exudate.  Eyes: Pupils are equal, round, and reactive to light. EOM are normal. Right eye exhibits no discharge. Left eye exhibits no discharge.  Neck: Normal range of motion. Neck supple.  Cardiovascular: Normal rate and regular rhythm.  No murmur heard. Pulmonary/Chest: Effort normal. No respiratory distress. He has no wheezes.  Decreased breath sound bilaterally.   Abdominal: Bowel sounds are normal. He exhibits no distension. There is no tenderness.  Musculoskeletal: He exhibits no edema.  Lymphadenopathy:    He has no cervical adenopathy.  Neurological: He is alert and oriented to person, place, and time. No cranial nerve deficit.  Skin: Skin is warm.  Psychiatric: Affect and judgment normal.     LABORATORY DATA:  I have reviewed the data as listed No results found for: WBC, HGB, HCT, MCV, PLT No results for input(s): NA, K, CL, CO2, GLUCOSE, BUN, CREATININE, CALCIUM, GFRNONAA, GFRAA, PROT, ALBUMIN, AST, ALT, ALKPHOS, BILITOT, BILIDIR, IBILI in the last 8760 hours.     ASSESSMENT & PLAN:  1. Primary adenocarcinoma of right lung ([place])   2. Goals of care, counseling/discussion   3. Abnormal positron emission tomography (PET) of colon    #Pathology results were reviewed personally and discussed with patient and his family member.  I discussed about cancer diagnosis and explained in details about patient's disease extent. If colonoscopy showed no disease involvement, his lung cancer staging is T1bN0M0, Stage IA.  Recommend surgery, if not surgical candidate, recommend  definitive radiation.   He has already established care with Dr. Genevive Bi who he is going to follow-up later this week to discuss about surgery. # Goal of care: Curative intent, discussed with patient.  # Sigmoid colon abnormal hypermetabolic activity on PET scan: reactive vs metastatic. Agree with GI work up.   patient has been referred to gastroenterologist by Dr. Genevive Bi and has scheduled appointment of colonoscopy at the end of this month. # Clinically he has claudication, likely peripheral vascular disease from smoking.   All questions were answered. The patient knows to call the clinic with any problems questions or concerns.  Return of visit: To be determined.  RN navigator will coordinate. Thank you for this kind referral and the opportunity to participate in the care of this  patient. A copy of today's note is routed to referring provider    Earlie Server, MD, PhD Hematology Oncology [facility] at [facility] Pager- [phone] 02/08/2018

## 2018-02-09 ENCOUNTER — Telehealth: Payer: Self-pay | Admitting: Cardiothoracic Surgery

## 2018-02-09 NOTE — Telephone Encounter (Signed)
Please see other note from today.

## 2018-02-09 NOTE — Telephone Encounter (Signed)
Patient's daughter, Kerman Passey, has called and stated that the patient's pathology report has came back from [facility] and patient was advised of the results. The patient and daughter are very anxious to see Dr Genevive Bi sooner than 4/25. Lattie Haw stated that Dr Genevive Bi was to be asked by a nurse from a previous conversation. Please update Lattie Haw on the response from Dr Genevive Bi when informed.

## 2018-02-11 ENCOUNTER — Ambulatory Visit: Payer: BLUE CROSS/BLUE SHIELD | Admitting: Oncology

## 2018-02-15 ENCOUNTER — Ambulatory Visit (INDEPENDENT_AMBULATORY_CARE_PROVIDER_SITE_OTHER): Payer: BLUE CROSS/BLUE SHIELD | Admitting: Cardiothoracic Surgery

## 2018-02-15 ENCOUNTER — Other Ambulatory Visit: Payer: Self-pay | Admitting: Cardiothoracic Surgery

## 2018-02-15 ENCOUNTER — Encounter: Payer: Self-pay | Admitting: Cardiothoracic Surgery

## 2018-02-15 VITALS — BP 168/91 | HR 98 | Temp 97.7°F | Resp 18 | Ht 68.5 in | Wt 159.4 lb

## 2018-02-15 DIAGNOSIS — R918 Other nonspecific abnormal finding of lung field: Secondary | ICD-10-CM

## 2018-02-15 NOTE — Progress Notes (Signed)
  Patient ID: Joseph Barron, male   DOB: [birth year], 67 y.o.   MRN: [id number]  HISTORY: Joseph Barron returns today in follow-up.  He has now stopped smoking and no longer uses his Chantix.  It has been about 2 months since he stopped.  He is continuing on his job.  He states that he is able to walk up one flight of stairs via a ladder which he does routinely.  He also states that he can walk up 3 flights of stairs without stopping but when he does get to the top he does stop to catch his breath for a minute or 2.  I did review his CT scans with Dr. 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  He states that he has done well since his biopsy although he did have severe pain one day several days following the biopsy which prompted a return visit to the [facility] and a chest x-ray.  He was told that the chest x-ray was normal although we do not have those results.   Vitals:   02/15/18 0806  BP: (!) 168/91  Pulse: 98  Resp: 18  Temp: 97.7 F (36.5 C)  SpO2: 99%     EXAM:    Resp: Lungs are clear bilaterally.  No respiratory distress, normal effort.  There is clubbing of the digits Heart:  Regular without murmurs Abd:  Abdomen is soft, non distended and non tender. No masses are palpable.  There is no rebound and no guarding.  Neurological: Alert and oriented to person, place, and time. Coordination normal.  Skin: Skin is warm and dry. No rash noted. No diaphoretic. No erythema. No pallor.  Psychiatric: Normal mood and affect. Normal behavior. Judgment and thought content normal.    ASSESSMENT: I had a long discussion today with Joseph Barron and his wife.  His CT scan shows significant emphysema and fibrosis.  His pulmonary function studies are also quite poor overall.  I reviewed with him the indications and risks of the various treatment options for his stage I carcinoma of the lung.  We reviewed in detail the indications and risks of surgery  and the alternatives including radiation therapy.  I had a long discussion with him regarding the standard of care which would be a lobectomy.  The patient and his wife had a good understanding of the disease process.  I reviewed with him the indications and risks of radiation and surgery as well as advantages and disadvantages they had all their questions answered.  After an extensive discussion today regarding the potential therapy for his lung cancer he has opted to undergo a thoracotomy with wedge resection of the right upper lobe mass   PLAN:   We will plan on a thoracotomy with wedge resection of the right upper lobe mass.  The patient understands that if margins are close or positive that he will need radiation therapy.  He does not want to undergo definitive radiation therapy or lobectomy.  We have tentatively scheduled his surgery for April 30 the day following his colonoscopy.    Nestor Lewandowsky, MD

## 2018-02-15 NOTE — H&P (View-Only) (Signed)
  Patient ID: Joseph Barron, male   DOB: 1951/06/17, 67 y.o.   MRN: [id number]  HISTORY: Joseph Barron returns today in follow-up.  He has now stopped smoking and no longer uses his Chantix.  It has been about 2 months since he stopped.  He is continuing on his job.  He states that he is able to walk up one flight of stairs via a ladder which he does routinely.  He also states that he can walk up 3 flights of stairs without stopping but when he does get to the top he does stop to catch his breath for a minute or 2.  I did review his CT scans with Dr. 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  He states that he has done well since his biopsy although he did have severe pain one day several days following the biopsy which prompted a return visit to the [facility] and a chest x-ray.  He was told that the chest x-ray was normal although we do not have those results.   Vitals:   02/15/18 0806  BP: (!) 168/91  Pulse: 98  Resp: 18  Temp: 97.7 F (36.5 C)  SpO2: 99%     EXAM:    Resp: Lungs are clear bilaterally.  No respiratory distress, normal effort.  There is clubbing of the digits Heart:  Regular without murmurs Abd:  Abdomen is soft, non distended and non tender. No masses are palpable.  There is no rebound and no guarding.  Neurological: Alert and oriented to person, place, and time. Coordination normal.  Skin: Skin is warm and dry. No rash noted. No diaphoretic. No erythema. No pallor.  Psychiatric: Normal mood and affect. Normal behavior. Judgment and thought content normal.    ASSESSMENT: I had a long discussion today with Joseph Barron and his wife.  His CT scan shows significant emphysema and fibrosis.  His pulmonary function studies are also quite poor overall.  I reviewed with him the indications and risks of the various treatment options for his stage I carcinoma of the lung.  We reviewed in detail the indications and risks of surgery  and the alternatives including radiation therapy.  I had a long discussion with him regarding the standard of care which would be a lobectomy.  The patient and his wife had a good understanding of the disease process.  I reviewed with him the indications and risks of radiation and surgery as well as advantages and disadvantages they had all their questions answered.  After an extensive discussion today regarding the potential therapy for his lung cancer he has opted to undergo a thoracotomy with wedge resection of the right upper lobe mass   PLAN:   We will plan on a thoracotomy with wedge resection of the right upper lobe mass.  The patient understands that if margins are close or positive that he will need radiation therapy.  He does not want to undergo definitive radiation therapy or lobectomy.  We have tentatively scheduled his surgery for April 30 the day following his colonoscopy.    Nestor Lewandowsky, MD

## 2018-02-15 NOTE — Patient Instructions (Signed)
Please see your blue pre-care sheet for surgery information.   Lung Resection A lung resection is a procedure to remove part or all of a lung. When an entire lung is removed, the procedure is called a pneumonectomy. When only part of a lung is removed, the procedure is called a lobectomy. A lung resection is typically done to get rid of a tumor or cancer, but it may be done to treat other conditions. This procedure can help relieve some or all of your symptoms and can also help keep the problem from getting worse. Lung resection may provide the best chance for curing your disease. However, the procedure may not necessarily cure lung cancer if that is the problem. Tell a health care provider about:  Any allergies you have.  All medicines you are taking, including vitamins, herbs, eye drops, creams, and over-the-counter medicines.  Any problems you or family members have had with anesthetic medicines.  Any blood disorders you have.  Any surgeries you have had.  Any medical conditions you have. What are the risks? Generally, lung resection is a safe procedure. However, problems can occur and include:  Excessive bleeding.  Infection.  Inability to breathe without a ventilator.  Persistent shortness of breath.  Heart problems, including abnormal rhythms and a risk of heart attack or heart failure.  Blood clots.  Injury to a blood vessel.  Injury to a nerve.  Failure to heal properly.  Stroke.  Bronchopleural fistula. This is a small hole between one of the main breathing tubes (bronchus) and the lining of the lungs. This is rare.  Reaction to anesthesia.  What happens before the procedure? You may have tests done before the procedure, including:  Blood tests.  Urine tests.  X-rays.  Other imaging tests (such as CT scans, MRI scans, and PET scans). These tests are done to find the exact size and location of the condition being treated with this surgery.  Pulmonary  function tests. These are breathing tests to assess the function of your lungs before surgery and to decide how to best help your breathing after surgery.  Heart testing. This is done to make sure your heart is strong enough for the procedure.  Bronchoscopy. This is a technique that allows your health care provider to look at the inside of your airways. This is done using a soft, flexible tube (bronchoscope). Along with imaging tests, this can help your health care provider know the exact location and size of the area that will be removed during surgery.  Lymph node sampling. This may need to be done to see if the tumor has spread. It may be done as a separate surgery or right before your lung resection procedure.  What happens during the procedure?  An IV tube will be placed in your arm. You will be given a medicine that makes you fall asleep (general anesthetic). You may also get pain medicine through a thin, flexible tube (catheter) in your back.  A breathing tube will be placed in your throat.  Once the surgical team has prepared you for surgery, your surgeon will make an incision on your side. Some resections are done through large incisions, while others can be done through small incisions using smaller instruments and assisted with small cameras (laparoscopic surgery).  Your surgeon will carefully cut the veins, arteries, and bronchus leading to your lung. After being cut, each of these pieces will be sewn or stapled closed. The lung or part of the lung will then  be removed.  Your surgeon will check inside your chest to make sure there is no bleeding in or around the lungs. Lymph nodes near the lung may also be removed for later tests.  Your surgeon may put tubes into your chest to drain extra fluid and air after surgery.  Your incision will be closed. This may be done using: ? Stitches that absorb into your body and do not need to be removed. ? Stitches that must be  removed. ? Staples that must be removed. What happens after the procedure?  You will be taken to the recovery area and your progress will be monitored. You may still have a breathing tube and other tubes or catheters in your body immediately after surgery. These will be removed during your recovery. You may be put on a respirator following surgery if some assistance is needed to help your breathing. When you are awake and not experiencing immediate problems from surgery, you will be moved to the intensive care unit (ICU) where you will continue your recovery.  You may feel pain in your chest and throat. Sometimes during recovery, patients may shiver or feel nauseous. You will be given medicine to help with pain and nausea.  The breathing tube will be taken out as soon as your health care providers feel you can breathe on your own. For most people, this happens on the same day as the surgery.  If your surgery and time in the ICU go well, most of the tubes and equipment will be taken out within 1-2 days after surgery. This is about how long most people stay in the ICU. You may need to stay longer, depending on how you are doing.  You should also start respiratory therapy in the ICU. This therapy uses breathing exercises to help your other lung stay healthy and get stronger.  As you improve, you will be moved to a regular hospital room for continued respiratory therapy, help with your bladder and bowels, and to continue medicines.  After your lung or part of your lung is taken out, there will be a space inside your chest. This space will often fill up with fluid over time. The amount of time this takes is different for each person.  You will receive care until you are doing well and your health care provider feels it is safe for you to go home or to transfer to an extended care facility. This information is not intended to replace advice given to you by your health care provider. Make sure you  discuss any questions you have with your health care provider. Document Released: 01/03/2003 Document Revised: 03/20/2016 Document Reviewed: 12/02/2013 Elsevier Interactive Patient Education  2018 [employer].

## 2018-02-16 ENCOUNTER — Telehealth: Payer: Self-pay | Admitting: Cardiothoracic Surgery

## 2018-02-16 NOTE — Telephone Encounter (Signed)
Pt advised of pre op date/time and sx date. Sx: 02/23/18 with Dr Genevive Bi for a right thoracotomy with wedge resection.  Pre op: 02/18/18 @ 2:15pm--office interview.   Patient made aware to call [phone], between 1-3:00pm the day before surgery, to find out what time to arrive.

## 2018-02-17 ENCOUNTER — Telehealth: Payer: Self-pay | Admitting: Cardiothoracic Surgery

## 2018-02-17 NOTE — Telephone Encounter (Signed)
Patients son in law came by and dropped off fmla/disability paperwork to be filled out, payment has already been paid for the paperwork. Please also make a copy for patients records.

## 2018-02-18 ENCOUNTER — Encounter
Admission: RE | Admit: 2018-02-18 | Discharge: 2018-02-18 | Disposition: A | Discharge status: Home / Self Care | Payer: BLUE CROSS/BLUE SHIELD | Source: Ambulatory Visit | Attending: Cardiothoracic Surgery | Admitting: Cardiothoracic Surgery

## 2018-02-18 ENCOUNTER — Other Ambulatory Visit: Payer: Self-pay

## 2018-02-18 ENCOUNTER — Ambulatory Visit: Payer: Self-pay | Admitting: Cardiothoracic Surgery

## 2018-02-18 ENCOUNTER — Ambulatory Visit
Admission: RE | Admit: 2018-02-18 | Discharge: 2018-02-18 | Disposition: A | Discharge status: Home / Self Care | Payer: BLUE CROSS/BLUE SHIELD | Source: Ambulatory Visit | Attending: Cardiothoracic Surgery | Admitting: Cardiothoracic Surgery

## 2018-02-18 DIAGNOSIS — Z01818 Encounter for other preprocedural examination: Secondary | ICD-10-CM | POA: Insufficient documentation

## 2018-02-18 DIAGNOSIS — Z0183 Encounter for blood typing: Secondary | ICD-10-CM | POA: Diagnosis not present

## 2018-02-18 DIAGNOSIS — R918 Other nonspecific abnormal finding of lung field: Secondary | ICD-10-CM

## 2018-02-18 DIAGNOSIS — Z01812 Encounter for preprocedural laboratory examination: Secondary | ICD-10-CM | POA: Insufficient documentation

## 2018-02-18 DIAGNOSIS — R9431 Abnormal electrocardiogram [ECG] [EKG]: Secondary | ICD-10-CM | POA: Diagnosis not present

## 2018-02-18 DIAGNOSIS — Z01810 Encounter for preprocedural cardiovascular examination: Secondary | ICD-10-CM

## 2018-02-18 DIAGNOSIS — R001 Bradycardia, unspecified: Secondary | ICD-10-CM | POA: Insufficient documentation

## 2018-02-18 LAB — COMPREHENSIVE METABOLIC PANEL
ALBUMIN: 3.7 g/dL (ref 3.5–5.0)
ALT: 26 U/L (ref 17–63)
ANION GAP: 7 (ref 5–15)
AST: 30 U/L (ref 15–41)
Alkaline Phosphatase: 87 U/L (ref 38–126)
BUN: 12 mg/dL (ref 6–20)
CHLORIDE: 106 mmol/L (ref 101–111)
CO2: 26 mmol/L (ref 22–32)
Calcium: 9 mg/dL (ref 8.9–10.3)
Creatinine, Ser: 1.29 mg/dL — ABNORMAL HIGH (ref 0.61–1.24)
GFR calc non Af Amer: 55 mL/min — ABNORMAL LOW (ref >60)
GLUCOSE: 130 mg/dL — AB (ref 65–99)
Potassium: 3.8 mmol/L (ref 3.5–5.1)
SODIUM: 139 mmol/L (ref 135–145)
Total Bilirubin: 0.5 mg/dL (ref 0.3–1.2)
Total Protein: 7.5 g/dL (ref 6.5–8.1)

## 2018-02-18 LAB — CBC WITH DIFFERENTIAL/PLATELET
BASOS PCT: 1 %
Basophils Absolute: 0.1 10*3/uL (ref 0–0.1)
EOS ABS: 0.8 10*3/uL — AB (ref 0–0.7)
Eosinophils Relative: 8 %
HCT: 39.9 % — ABNORMAL LOW (ref 40.0–52.0)
Hemoglobin: 13.6 g/dL (ref 13.0–18.0)
Lymphocytes Relative: 37 %
Lymphs Abs: 3.5 10*3/uL (ref 1.0–3.6)
MCH: 31.8 pg (ref 26.0–34.0)
MCHC: 34 g/dL (ref 32.0–36.0)
MCV: 93.7 fL (ref 80.0–100.0)
MONO ABS: 0.8 10*3/uL (ref 0.2–1.0)
MONOS PCT: 8 %
NEUTROS ABS: 4.4 10*3/uL (ref 1.4–6.5)
Neutrophils Relative %: 46 %
PLATELETS: 309 10*3/uL (ref 150–440)
RBC: 4.26 MIL/uL — AB (ref 4.40–5.90)
RDW: 13.5 % (ref 11.5–14.5)
WBC: 9.7 10*3/uL (ref 3.8–10.6)

## 2018-02-18 LAB — APTT: APTT: 31 s (ref 24–36)

## 2018-02-18 LAB — SURGICAL PCR SCREEN
MRSA, PCR: NEGATIVE
Staphylococcus aureus: NEGATIVE

## 2018-02-18 LAB — PROTIME-INR
INR: 0.89
Prothrombin Time: 12 seconds (ref 11.4–15.2)

## 2018-02-18 MED ORDER — CHLORHEXIDINE GLUCONATE CLOTH 2 % EX PADS
6.0000 | MEDICATED_PAD | Freq: Once | CUTANEOUS | Status: DC
  Filled 2018-02-18: qty 6

## 2018-02-18 NOTE — Patient Instructions (Signed)
Your procedure is scheduled on: 02/23/18 Tues Report to Same Day Surgery 2nd floor medical mall [facility] Entrance-take elevator on left to 2nd floor.  Check in with surgery information desk.) To find out your arrival time please call [phone] between 1PM - 3PM on 02/22/18 Mon  Remember: Instructions that are not followed completely may result in serious medical risk, up to and including death, or upon the discretion of your surgeon and anesthesiologist your surgery may need to be rescheduled.    _x___ 1. Do not eat food after midnight the night before your procedure. You may drink clear liquids up to 2 hours before you are scheduled to arrive at the hospital for your procedure.  Do not drink clear liquids within 2 hours of your scheduled arrival to the hospital.  Clear liquids include  --Water or Apple juice without pulp  --Clear carbohydrate beverage such as ClearFast or Gatorade  --Black Coffee or Clear Tea (No milk, no creamers, do not add anything to                  the coffee or Tea Type 1 and type 2 diabetics should only drink water.  No gum chewing or hard candies.     __x__ 2. No Alcohol for 24 hours before or after surgery.   __x__3. No Smoking or e-cigarettes for 24 prior to surgery.  Do not use any chewable tobacco products for at least 6 hour prior to surgery   ____  4. Bring all medications with you on the day of surgery if instructed.    __x__ 5. Notify your doctor if there is any change in your medical condition     (cold, fever, infections).    x___6. On the morning of surgery brush your teeth with toothpaste and water.  You may rinse your mouth with mouth wash if you wish.  Do not swallow any toothpaste or mouthwash.   Do not wear jewelry, make-up, hairpins, clips or nail polish.  Do not wear lotions, powders, or perfumes. You may wear deodorant.  Do not shave 48 hours prior to surgery. Men may shave face and neck.  Do not bring valuables to the hospital.     [facility] is not responsible for any belongings or valuables.               Contacts, dentures or bridgework may not be worn into surgery.  Leave your suitcase in the car. After surgery it may be brought to your room.  For patients admitted to the hospital, discharge time is determined by your                       treatment team.  _  Patients discharged the day of surgery will not be allowed to drive home.  You will need someone to drive you home and stay with you the night of your procedure.    Please read over the following fact sheets that you were given:   [facility] Preparing for Surgery and or MRSA Information   _x___ Take anti-hypertensive listed below, cardiac, seizure, asthma,     anti-reflux and psychiatric medicines. These include:  1. omeprazole (PRILOSEC) 20 MG capsule  2.  3.  4.  5.  6.  ____Fleets enema or Magnesium Citrate as directed.   _x___ Use CHG Soap or sage wipes as directed on instruction sheet   ____ Use inhalers on the day of surgery and bring to hospital  day of surgery  ____ Stop Metformin and Janumet 2 days prior to surgery.    ____ Take 1/2 of usual insulin dose the night before surgery and none on the morning     surgery.   _x___ Follow recommendations from Cardiologist, Pulmonologist or PCP regarding          stopping Aspirin, Coumadin, Plavix ,Eliquis, Effient, or Pradaxa, and Pletal.  X____Stop Anti-inflammatories such as Advil, Aleve, Ibuprofen, Motrin, Naproxen, Naprosyn, Goodies powders or aspirin products. OK to take Tylenol and                          Celebrex.   _x___ Stop supplements until after surgery.  But may continue Vitamin D, Vitamin B,       and multivitamin.   ____ Bring C-Pap to the hospital.

## 2018-02-19 ENCOUNTER — Encounter: Payer: Self-pay | Admitting: *Deleted

## 2018-02-21 MED ORDER — CEFAZOLIN SODIUM-DEXTROSE 2-4 GM/100ML-% IV SOLN: 2.0000 g | INTRAVENOUS | Status: DC

## 2018-02-22 ENCOUNTER — Encounter
Admission: RE | Disposition: A | Discharge status: Home / Self Care | Payer: Self-pay | Source: Ambulatory Visit | Attending: Unknown Physician Specialty

## 2018-02-22 ENCOUNTER — Encounter: Payer: Self-pay | Admitting: Anesthesiology

## 2018-02-22 ENCOUNTER — Ambulatory Visit: Payer: BLUE CROSS/BLUE SHIELD | Admitting: Anesthesiology

## 2018-02-22 ENCOUNTER — Ambulatory Visit
Admission: RE | Admit: 2018-02-22 | Discharge: 2018-02-22 | Disposition: A | Discharge status: Home / Self Care | Payer: BLUE CROSS/BLUE SHIELD | Source: Ambulatory Visit | Attending: Unknown Physician Specialty | Admitting: Unknown Physician Specialty

## 2018-02-22 DIAGNOSIS — I10 Essential (primary) hypertension: Secondary | ICD-10-CM | POA: Diagnosis not present

## 2018-02-22 DIAGNOSIS — K219 Gastro-esophageal reflux disease without esophagitis: Secondary | ICD-10-CM | POA: Diagnosis not present

## 2018-02-22 DIAGNOSIS — R7303 Prediabetes: Secondary | ICD-10-CM | POA: Insufficient documentation

## 2018-02-22 DIAGNOSIS — R918 Other nonspecific abnormal finding of lung field: Secondary | ICD-10-CM | POA: Diagnosis not present

## 2018-02-22 DIAGNOSIS — Z79899 Other long term (current) drug therapy: Secondary | ICD-10-CM | POA: Diagnosis not present

## 2018-02-22 DIAGNOSIS — E785 Hyperlipidemia, unspecified: Secondary | ICD-10-CM | POA: Insufficient documentation

## 2018-02-22 DIAGNOSIS — R933 Abnormal findings on diagnostic imaging of other parts of digestive tract: Secondary | ICD-10-CM | POA: Diagnosis present

## 2018-02-22 DIAGNOSIS — D125 Benign neoplasm of sigmoid colon: Secondary | ICD-10-CM | POA: Insufficient documentation

## 2018-02-22 DIAGNOSIS — K621 Rectal polyp: Secondary | ICD-10-CM | POA: Insufficient documentation

## 2018-02-22 DIAGNOSIS — Z87891 Personal history of nicotine dependence: Secondary | ICD-10-CM | POA: Diagnosis not present

## 2018-02-22 HISTORY — PX: COLONOSCOPY WITH PROPOFOL: SHX5780

## 2018-02-22 HISTORY — DX: Hyperlipidemia, unspecified: E78.5

## 2018-02-22 SURGERY — COLONOSCOPY WITH PROPOFOL
Anesthesia: General

## 2018-02-22 MED ORDER — PROPOFOL 500 MG/50ML IV EMUL
INTRAVENOUS | Status: DC | PRN
  Administered 2018-02-22: 120 ug/kg/min via INTRAVENOUS

## 2018-02-22 MED ORDER — PROPOFOL 500 MG/50ML IV EMUL
INTRAVENOUS | Status: AC
  Filled 2018-02-22: qty 50

## 2018-02-22 MED ORDER — MIDAZOLAM HCL 2 MG/2ML IJ SOLN
INTRAMUSCULAR | Status: AC
  Filled 2018-02-22: qty 2

## 2018-02-22 MED ORDER — SODIUM CHLORIDE 0.9 % IV SOLN
INTRAVENOUS | Status: DC
  Administered 2018-02-22: 1000 mL via INTRAVENOUS

## 2018-02-22 MED ORDER — FENTANYL CITRATE (PF) 100 MCG/2ML IJ SOLN
INTRAMUSCULAR | Status: AC
  Filled 2018-02-22: qty 2

## 2018-02-22 MED ORDER — EPHEDRINE SULFATE 50 MG/ML IJ SOLN
INTRAMUSCULAR | Status: DC | PRN
  Administered 2018-02-22 (×4): 10 mg via INTRAVENOUS

## 2018-02-22 MED ORDER — SODIUM CHLORIDE 0.9 % IV SOLN: INTRAVENOUS | Status: DC

## 2018-02-22 MED ORDER — EPHEDRINE SULFATE 50 MG/ML IJ SOLN
INTRAMUSCULAR | Status: AC
  Filled 2018-02-22: qty 1

## 2018-02-22 MED ORDER — LIDOCAINE HCL (PF) 1 % IJ SOLN
INTRAMUSCULAR | Status: AC
  Administered 2018-02-22: 0.3 mL
  Filled 2018-02-22: qty 2

## 2018-02-22 MED ORDER — PHENYLEPHRINE HCL 10 MG/ML IJ SOLN
INTRAMUSCULAR | Status: AC
  Filled 2018-02-22: qty 1

## 2018-02-22 MED ORDER — PHENYLEPHRINE HCL 10 MG/ML IJ SOLN
INTRAMUSCULAR | Status: DC | PRN
  Administered 2018-02-22 (×3): 50 ug via INTRAVENOUS

## 2018-02-22 MED ORDER — PROPOFOL 10 MG/ML IV BOLUS
INTRAVENOUS | Status: AC
  Filled 2018-02-22: qty 20

## 2018-02-22 MED ORDER — FENTANYL CITRATE (PF) 100 MCG/2ML IJ SOLN
INTRAMUSCULAR | Status: DC | PRN
  Administered 2018-02-22: 50 ug via INTRAVENOUS

## 2018-02-22 MED ORDER — MIDAZOLAM HCL 2 MG/2ML IJ SOLN
INTRAMUSCULAR | Status: DC | PRN
  Administered 2018-02-22: 2 mg via INTRAVENOUS

## 2018-02-22 NOTE — Anesthesia Preprocedure Evaluation (Addendum)
Anesthesia Evaluation  Patient identified by MRN, date of birth, ID band Patient awake    Reviewed: Allergy & Precautions, H&P , NPO status , Patient's Chart, lab work & pertinent test results, reviewed documented beta blocker date and time   Airway Mallampati: II  TM Distance: >3 FB Neck ROM: full    Dental  (+) Edentulous Upper, Edentulous Lower   Pulmonary neg sleep apnea, neg COPD, former smoker,    Pulmonary exam normal breath sounds clear to auscultation- rhonchi (-) wheezing      Cardiovascular hypertension, Pt. on medications (-) CAD, (-) Past MI, (-) Cardiac Stents and (-) CABG Normal cardiovascular exam Rhythm:regular Rate:Normal - Systolic murmurs and - Diastolic murmurs    Neuro/Psych negative neurological ROS  negative psych ROS   GI/Hepatic Neg liver ROS, GERD  Medicated,  Endo/Other  negative endocrine ROSneg diabetes  Renal/GU negative Renal ROS  negative genitourinary   Musculoskeletal   Abdominal (+) - obese,   Peds  Hematology negative hematology ROS (+)   Anesthesia Other Findings Past Medical History: 08/06/2017: Borderline diabetes mellitus No date: Cancer ([place])     Comment:  lung No date: GERD (gastroesophageal reflux disease) No date: Hyperlipemia No date: Hypertension 01/13/2018: Pulmonary nodules 12/22/2017: Vaccine counseling Past Surgical History: child: EYE SURGERY; Left BMI    Body Mass Index:  23.48 kg/m     Reproductive/Obstetrics negative OB ROS                            Anesthesia Physical Anesthesia Plan  ASA: II  Anesthesia Plan: General   Post-op Pain Management:    Induction:   PONV Risk Score and Plan: 1 and Propofol infusion  Airway Management Planned: Natural Airway  Additional Equipment:   Intra-op Plan:   Post-operative Plan:   Informed Consent: I have reviewed the patients History and Physical, chart, labs and discussed the  procedure including the risks, benefits and alternatives for the proposed anesthesia with the patient or authorized representative who has indicated his/her understanding and acceptance.   Dental Advisory Given  Plan Discussed with: CRNA  Anesthesia Plan Comments:        Anesthesia Quick Evaluation

## 2018-02-22 NOTE — Anesthesia Post-op Follow-up Note (Signed)
Anesthesia QCDR form completed.        

## 2018-02-22 NOTE — Transfer of Care (Signed)
Immediate Anesthesia Transfer of Care Note  Patient: ANIKET PAYE  Procedure(s) Performed: COLONOSCOPY WITH PROPOFOL (N/A )  Patient Location: PACU  Anesthesia Type:General  Level of Consciousness: awake and sedated  Airway & Oxygen Therapy: Patient Spontanous Breathing and Patient connected to nasal cannula oxygen  Post-op Assessment: Report given to RN and Post -op Vital signs reviewed and stable  Post vital signs: Reviewed and stable  Last Vitals:  Vitals Value Taken Time  BP    Temp    Pulse    Resp    SpO2      Last Pain:  Vitals:   02/22/18 1139  TempSrc: Tympanic  PainSc: 0-No pain         Complications: No apparent anesthesia complications

## 2018-02-22 NOTE — Progress Notes (Signed)
EKG performed on 02/18/18 reviewed by Dr. Kayleen Memos (anesthesia) along with patient history and recent evaluation by cardiology. Ok to proceed with upcoming surgery as planned.

## 2018-02-22 NOTE — Op Note (Signed)
[facility] Patient Name: Joseph Barron Procedure Date: 02/22/2018 11:48 AM MRN: [id number] Account #: [id number] Date of Birth: [birth year] Admit Type: Outpatient Age: 67 Room: [facility] ENDO ROOM 3 Gender: Male Note Status: Finalized Procedure:            Colonoscopy Indications:          Abnormal CT of the GI tract Providers:            Manya Silvas, MD Referring MD:         Dion Body (Referring MD) Medicines:            Propofol per Anesthesia Complications:        No immediate complications. Procedure:            Pre-Anesthesia Assessment:                       - After reviewing the risks and benefits, the patient                        was deemed in satisfactory condition to undergo the                        procedure.                       After obtaining informed consent, the colonoscope was                        passed under direct vision. Throughout the procedure,                        the patient's blood pressure, pulse, and oxygen                        saturations were monitored continuously. The                        Colonoscope was introduced through the anus and                        advanced to the the cecum, identified by appendiceal                        orifice and ileocecal valve. The colonoscopy was                        performed without difficulty. The patient tolerated the                        procedure well. The quality of the bowel preparation                        was adequate to identify polyps. Findings:      A diminutive polyp was found in the descending colon. The polyp was       sessile. The polyp was removed with a jumbo cold forceps. Resection and       retrieval were complete.      A small polyp was found in the sigmoid colon. The polyp was sessile. The       polyp was removed with a hot snare. Resection and retrieval  were       complete.      A small polyp was found in the sigmoid colon. The  polyp was sessile. The       polyp was removed with a hot snare. Resection and retrieval were       complete.      A large polyp was found in the sigmoid colon. The polyp was sessile. The       polyp was removed with a hot snare. Resection and retrieval were       complete. To prevent bleeding after the polypectomy, one hemostatic clip       was successfully placed. There was no bleeding at the end of the       procedure.      A diminutive polyp was found in the sigmoid colon. The polyp was       sessile. The polyp was removed with a jumbo cold forceps. Resection and       retrieval were complete.      A diminutive polyp was found in the sigmoid colon. The polyp was       sessile. The polyp was removed with a jumbo cold forceps. Resection and       retrieval were complete.      Four sessile polyps were found in the rectum. The polyps were diminutive       in size. These polyps were removed with a jumbo cold forceps. Resection       and retrieval were complete.      A diminutive polyp was found in the rectum. The polyp was sessile. The       polyp was removed with a jumbo cold forceps. Resection and retrieval       were complete.      The exam was otherwise without abnormality. Impression:           - One diminutive polyp in the descending colon, removed                        with a jumbo cold forceps. Resected and retrieved.                       - One small polyp in the sigmoid colon, removed with a                        hot snare. Resected and retrieved.                       - One small polyp in the sigmoid colon, removed with a                        hot snare. Resected and retrieved.                       - One large polyp in the sigmoid colon, removed with a                        hot snare. Resected and retrieved. Clip was placed.                       - One diminutive polyp in the sigmoid colon, removed  with a jumbo cold forceps. Resected and  retrieved.                       - One diminutive polyp in the sigmoid colon, removed                        with a jumbo cold forceps. Resected and retrieved.                       - Four diminutive polyps in the rectum, removed with a                        jumbo cold forceps. Resected and retrieved.                       - One diminutive polyp in the rectum, removed with a                        jumbo cold forceps. Resected and retrieved.                       - The examination was otherwise normal. Recommendation:       - Await pathology results. Manya Silvas, MD 02/22/2018 1:39:29 PM This report has been signed electronically. Number of Addenda: 0 Note Initiated On: 02/22/2018 11:48 AM Scope Withdrawal Time: 0 hours 40 minutes 39 seconds  Total Procedure Duration: 0 hours 45 minutes 35 seconds       [facility]

## 2018-02-22 NOTE — Anesthesia Procedure Notes (Signed)
Performed by: Vaughan Sine Pre-anesthesia Checklist: Patient identified, Emergency Drugs available, Suction available, Patient being monitored and Timeout performed Patient Re-evaluated:Patient Re-evaluated prior to induction Oxygen Delivery Method: Nasal cannula Preoxygenation: Pre-oxygenation with 100% oxygen Induction Type: IV induction Airway Equipment and Method: Oral airway Placement Confirmation: positive ETCO2 and CO2 detector

## 2018-02-22 NOTE — H&P (Signed)
Primary Care Physician:  Dion Body, MD Primary Gastroenterologist:  Dr. Vira Agar  Pre-Procedure History & Physical: HPI:  Joseph Barron is a 67 y.o. male is here for an colonoscopy. Done for Abnormal CT of sigmoid colon.   Past Medical History:  Diagnosis Date  . 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 GERD (gastroesophageal reflux disease)   . Hyperlipemia   . Hypertension   . Pulmonary nodules 01/13/2018  . Vaccine counseling 12/22/2017    Past Surgical History:  Procedure Laterality Date  . EYE SURGERY Left child    Prior to Admission medications   Medication Sig Start Date End Date Taking? Authorizing Provider  atorvastatin (LIPITOR) 10 MG tablet Take 10 mg by mouth at bedtime.  01/13/18   [provider]  lisinopril (PRINIVIL,ZESTRIL) 20 MG tablet Take 20 mg by mouth at bedtime.  01/13/18   [provider]  metoprolol succinate (TOPROL-XL) 25 MG 24 hr tablet Take 25 mg by mouth at bedtime.  01/25/18 01/25/19  [provider]  omeprazole (PRILOSEC) 20 MG capsule Take 40 mg by mouth daily.  12/22/17 12/22/18  [provider]    Allergies as of 02/10/2018  . (No Known Allergies)    Family History  Problem Relation Age of Onset  . Arthritis Mother   . COPD Father   . Coronary artery disease Father   . Transient ischemic attack Father   . Alcoholism Father   . Bowel Disease Father   . COPD Sister   . Prostatitis Brother   . Thyroid disease Sister   . Thyroid disease Sister   . Liver cancer Brother   . Diabetes Brother     Social History   Socioeconomic History  . Marital status: Married    Spouse name: Not on file  . Number of children: Not on file  . Years of education: Not on file  . Highest education level: Not on file  Occupational History  . Not on file  Social Needs  . Financial resource strain: Not on file  . Food insecurity:    Worry: Not on file    Inability: Not on file  .  Transportation needs:    Medical: Not on file    Non-medical: Not on file  Tobacco Use  . Smoking status: Former Smoker    Packs/day: 1.00    Years: 50.00    Pack years: 50.00    Types: Cigarettes    Last attempt to quit: 11/17/2017    Years since quitting: 0.2  . Smokeless tobacco: Former Systems developer    Types: [place] date: 01/15/1989  Substance and Sexual Activity  . Alcohol use: Yes    Alcohol/week: 5.4 oz    Types: 7 Cans of beer, 2 Shots of liquor per week  . Drug use: Never  . Sexual activity: Yes  Lifestyle  . Physical activity:    Days per week: Not on file    Minutes per session: Not on file  . Stress: Not on file  Relationships  . Social connections:    Talks on phone: Not on file    Gets together: Not on file    Attends religious service: Not on file    Active member of club or organization: Not on file    Attends meetings of clubs or organizations: Not on file    Relationship status: Not on file  . Intimate partner violence:  Fear of current or ex partner: Not on file    Emotionally abused: Not on file    Physically abused: Not on file    Forced sexual activity: Not on file  Other Topics Concern  . Not on file  Social History Narrative  . Not on file    Review of Systems: See HPI, otherwise negative ROS  Physical Exam: BP 119/78   Pulse (!) 57   Temp (!) 96.7 F (35.9 C) (Tympanic)   Resp 17   Ht 5\' 9"  (1.753 m)   Wt 72.1 kg (159 lb)   SpO2 98%   BMI 23.48 kg/m  General:   Alert,  pleasant and cooperative in NAD Head:  Normocephalic and atraumatic. Neck:  Supple; no masses or thyromegaly. Lungs:  Clear throughout to auscultation.    Heart:  Regular rate and rhythm. Abdomen:  Soft, nontender and nondistended. Normal bowel sounds, without guarding, and without rebound.   Neurologic:  Alert and  oriented x4;  grossly normal neurologically.  Impression/Plan: Joseph Barron is here for an colonoscopy to be performed for abnormal CT scan of  sigmoid colon.  Risks, benefits, limitations, and alternatives regarding  colonoscopy have been reviewed with the patient.  Questions have been answered.  All parties agreeable.   Gaylyn Cheers, MD  02/22/2018, 12:16 PM

## 2018-02-22 NOTE — Anesthesia Procedure Notes (Signed)
Performed by: Vaughan Sine

## 2018-02-23 ENCOUNTER — Inpatient Hospital Stay: Payer: BLUE CROSS/BLUE SHIELD

## 2018-02-23 ENCOUNTER — Encounter
Admission: RE | Disposition: A | Discharge status: Home / Self Care | Payer: Self-pay | Source: Ambulatory Visit | Attending: Surgery

## 2018-02-23 ENCOUNTER — Other Ambulatory Visit: Payer: Self-pay

## 2018-02-23 ENCOUNTER — Inpatient Hospital Stay
Admission: RE | Admit: 2018-02-23 | Discharge: 2018-03-11 | DRG: 164 | Disposition: A | Discharge status: Home / Self Care | Payer: BLUE CROSS/BLUE SHIELD | Source: Ambulatory Visit | Attending: Surgery | Admitting: Surgery

## 2018-02-23 DIAGNOSIS — C3491 Malignant neoplasm of unspecified part of right bronchus or lung: Secondary | ICD-10-CM

## 2018-02-23 DIAGNOSIS — Z23 Encounter for immunization: Secondary | ICD-10-CM | POA: Diagnosis not present

## 2018-02-23 DIAGNOSIS — Z800 Family history of malignant neoplasm of digestive organs: Secondary | ICD-10-CM | POA: Diagnosis not present

## 2018-02-23 DIAGNOSIS — Z825 Family history of asthma and other chronic lower respiratory diseases: Secondary | ICD-10-CM

## 2018-02-23 DIAGNOSIS — I10 Essential (primary) hypertension: Secondary | ICD-10-CM | POA: Diagnosis not present

## 2018-02-23 DIAGNOSIS — Z77090 Contact with and (suspected) exposure to asbestos: Secondary | ICD-10-CM | POA: Diagnosis present

## 2018-02-23 DIAGNOSIS — K219 Gastro-esophageal reflux disease without esophagitis: Secondary | ICD-10-CM | POA: Diagnosis present

## 2018-02-23 DIAGNOSIS — E785 Hyperlipidemia, unspecified: Secondary | ICD-10-CM | POA: Diagnosis not present

## 2018-02-23 DIAGNOSIS — Z833 Family history of diabetes mellitus: Secondary | ICD-10-CM | POA: Diagnosis not present

## 2018-02-23 DIAGNOSIS — J939 Pneumothorax, unspecified: Secondary | ICD-10-CM

## 2018-02-23 DIAGNOSIS — C3490 Malignant neoplasm of unspecified part of unspecified bronchus or lung: Secondary | ICD-10-CM

## 2018-02-23 DIAGNOSIS — J8410 Pulmonary fibrosis, unspecified: Secondary | ICD-10-CM | POA: Diagnosis present

## 2018-02-23 DIAGNOSIS — Z87891 Personal history of nicotine dependence: Secondary | ICD-10-CM

## 2018-02-23 DIAGNOSIS — R739 Hyperglycemia, unspecified: Secondary | ICD-10-CM | POA: Diagnosis not present

## 2018-02-23 DIAGNOSIS — C3411 Malignant neoplasm of upper lobe, right bronchus or lung: Principal | ICD-10-CM | POA: Diagnosis present

## 2018-02-23 DIAGNOSIS — Z6823 Body mass index (BMI) 23.0-23.9, adult: Secondary | ICD-10-CM

## 2018-02-23 DIAGNOSIS — C7A1 Malignant poorly differentiated neuroendocrine tumors: Secondary | ICD-10-CM | POA: Diagnosis not present

## 2018-02-23 DIAGNOSIS — Z79899 Other long term (current) drug therapy: Secondary | ICD-10-CM

## 2018-02-23 DIAGNOSIS — J95811 Postprocedural pneumothorax: Secondary | ICD-10-CM | POA: Diagnosis not present

## 2018-02-23 DIAGNOSIS — E669 Obesity, unspecified: Secondary | ICD-10-CM | POA: Diagnosis present

## 2018-02-23 DIAGNOSIS — E875 Hyperkalemia: Secondary | ICD-10-CM | POA: Diagnosis not present

## 2018-02-23 DIAGNOSIS — N179 Acute kidney failure, unspecified: Secondary | ICD-10-CM | POA: Diagnosis not present

## 2018-02-23 DIAGNOSIS — E440 Moderate protein-calorie malnutrition: Secondary | ICD-10-CM

## 2018-02-23 DIAGNOSIS — R7301 Impaired fasting glucose: Secondary | ICD-10-CM | POA: Diagnosis present

## 2018-02-23 DIAGNOSIS — Z8249 Family history of ischemic heart disease and other diseases of the circulatory system: Secondary | ICD-10-CM

## 2018-02-23 DIAGNOSIS — M549 Dorsalgia, unspecified: Secondary | ICD-10-CM | POA: Diagnosis not present

## 2018-02-23 DIAGNOSIS — Z09 Encounter for follow-up examination after completed treatment for conditions other than malignant neoplasm: Secondary | ICD-10-CM

## 2018-02-23 DIAGNOSIS — R109 Unspecified abdominal pain: Secondary | ICD-10-CM | POA: Diagnosis not present

## 2018-02-23 HISTORY — PX: THORACOTOMY/LOBECTOMY: SHX6116

## 2018-02-23 HISTORY — PX: VIDEO BRONCHOSCOPY: SHX5072

## 2018-02-23 LAB — CBC
HCT: 39.4 % — ABNORMAL LOW (ref 40.0–52.0)
Hemoglobin: 13.4 g/dL (ref 13.0–18.0)
MCH: 31.8 pg (ref 26.0–34.0)
MCHC: 34 g/dL (ref 32.0–36.0)
MCV: 93.4 fL (ref 80.0–100.0)
PLATELETS: 288 10*3/uL (ref 150–440)
RBC: 4.22 MIL/uL — ABNORMAL LOW (ref 4.40–5.90)
RDW: 13.6 % (ref 11.5–14.5)
WBC: 14.8 10*3/uL — AB (ref 3.8–10.6)

## 2018-02-23 LAB — BASIC METABOLIC PANEL
ANION GAP: 9 (ref 5–15)
BUN: 19 mg/dL (ref 6–20)
CALCIUM: 9 mg/dL (ref 8.9–10.3)
CO2: 24 mmol/L (ref 22–32)
Chloride: 103 mmol/L (ref 101–111)
Creatinine, Ser: 1.31 mg/dL — ABNORMAL HIGH (ref 0.61–1.24)
GFR, EST NON AFRICAN AMERICAN: 54 mL/min — AB (ref >60)
GLUCOSE: 171 mg/dL — AB (ref 65–99)
Potassium: 4.1 mmol/L (ref 3.5–5.1)
SODIUM: 136 mmol/L (ref 135–145)

## 2018-02-23 LAB — GLUCOSE, CAPILLARY: Glucose-Capillary: 144 mg/dL — ABNORMAL HIGH (ref 65–99)

## 2018-02-23 LAB — SURGICAL PATHOLOGY

## 2018-02-23 LAB — ABO/RH: ABO/RH(D): O NEG

## 2018-02-23 SURGERY — BRONCHOSCOPY, WITH FLUOROSCOPY
Anesthesia: General | Laterality: Right | Wound class: Clean Contaminated

## 2018-02-23 MED ORDER — LACTATED RINGERS IV SOLN
INTRAVENOUS | Status: DC | PRN
  Administered 2018-02-23: 12:00:00 via INTRAVENOUS

## 2018-02-23 MED ORDER — BUPIVACAINE HCL (PF) 0.5 % IJ SOLN
INTRAMUSCULAR | Status: DC | PRN
  Administered 2018-02-23: 30 mL

## 2018-02-23 MED ORDER — HYDROMORPHONE HCL 1 MG/ML IJ SOLN
INTRAMUSCULAR | Status: DC | PRN
  Administered 2018-02-23 (×2): .5 mg via INTRAVENOUS

## 2018-02-23 MED ORDER — METOPROLOL SUCCINATE ER 25 MG PO TB24
25.0000 mg | ORAL_TABLET | Freq: Every day | ORAL | Status: DC
  Administered 2018-02-23 – 2018-03-09 (×15): 25 mg via ORAL
  Filled 2018-02-23 (×15): qty 1

## 2018-02-23 MED ORDER — MORPHINE SULFATE (PF) 2 MG/ML IV SOLN: 1.0000 mg | INTRAVENOUS | Status: DC | PRN

## 2018-02-23 MED ORDER — SUGAMMADEX SODIUM 500 MG/5ML IV SOLN
INTRAVENOUS | Status: AC
  Filled 2018-02-23: qty 5

## 2018-02-23 MED ORDER — FENTANYL CITRATE (PF) 100 MCG/2ML IJ SOLN
INTRAMUSCULAR | Status: AC
  Filled 2018-02-23: qty 2

## 2018-02-23 MED ORDER — LISINOPRIL 20 MG PO TABS
20.0000 mg | ORAL_TABLET | Freq: Every day | ORAL | Status: DC
  Administered 2018-02-23 – 2018-03-08 (×14): 20 mg via ORAL
  Filled 2018-02-23 (×14): qty 1

## 2018-02-23 MED ORDER — PNEUMOCOCCAL VAC POLYVALENT 25 MCG/0.5ML IJ INJ
0.5000 mL | INJECTION | INTRAMUSCULAR | Status: AC
  Administered 2018-02-24: 0.5 mL via INTRAMUSCULAR
  Filled 2018-02-23: qty 0.5

## 2018-02-23 MED ORDER — EPHEDRINE SULFATE 50 MG/ML IJ SOLN
INTRAMUSCULAR | Status: DC | PRN
  Administered 2018-02-23: 5 mg via INTRAVENOUS
  Administered 2018-02-23 (×2): 10 mg via INTRAVENOUS
  Administered 2018-02-23 (×5): 5 mg via INTRAVENOUS

## 2018-02-23 MED ORDER — BISACODYL 5 MG PO TBEC
10.0000 mg | DELAYED_RELEASE_TABLET | Freq: Every day | ORAL | Status: DC
  Administered 2018-02-25 – 2018-03-01 (×4): 10 mg via ORAL
  Administered 2018-03-07: 5 mg via ORAL
  Filled 2018-02-23 (×15): qty 2

## 2018-02-23 MED ORDER — METHOCARBAMOL 500 MG PO TABS
500.0000 mg | ORAL_TABLET | Freq: Four times a day (QID) | ORAL | Status: DC
  Administered 2018-02-23 – 2018-02-26 (×9): 500 mg via ORAL
  Filled 2018-02-23 (×18): qty 1

## 2018-02-23 MED ORDER — ACETAMINOPHEN 10 MG/ML IV SOLN
INTRAVENOUS | Status: AC
  Filled 2018-02-23: qty 100

## 2018-02-23 MED ORDER — CEFAZOLIN SODIUM-DEXTROSE 2-4 GM/100ML-% IV SOLN
INTRAVENOUS | Status: AC
  Filled 2018-02-23: qty 100

## 2018-02-23 MED ORDER — PANTOPRAZOLE SODIUM 40 MG PO TBEC
40.0000 mg | DELAYED_RELEASE_TABLET | Freq: Every day | ORAL | Status: DC
  Administered 2018-02-24 – 2018-03-10 (×15): 40 mg via ORAL
  Filled 2018-02-23 (×15): qty 1

## 2018-02-23 MED ORDER — FENTANYL CITRATE (PF) 100 MCG/2ML IJ SOLN: 25.0000 ug | INTRAMUSCULAR | Status: DC | PRN

## 2018-02-23 MED ORDER — ROCURONIUM BROMIDE 50 MG/5ML IV SOLN
INTRAVENOUS | Status: AC
  Filled 2018-02-23: qty 1

## 2018-02-23 MED ORDER — ROCURONIUM BROMIDE 100 MG/10ML IV SOLN
INTRAVENOUS | Status: DC | PRN
  Administered 2018-02-23: 20 mg via INTRAVENOUS
  Administered 2018-02-23: 30 mg via INTRAVENOUS
  Administered 2018-02-23: 20 mg via INTRAVENOUS

## 2018-02-23 MED ORDER — ACETAMINOPHEN 10 MG/ML IV SOLN
INTRAVENOUS | Status: DC | PRN
  Administered 2018-02-23: 1000 mg via INTRAVENOUS

## 2018-02-23 MED ORDER — GABAPENTIN 100 MG PO CAPS
100.0000 mg | ORAL_CAPSULE | Freq: Three times a day (TID) | ORAL | Status: DC
  Administered 2018-02-23 – 2018-02-26 (×8): 100 mg via ORAL
  Filled 2018-02-23 (×9): qty 1

## 2018-02-23 MED ORDER — OXYCODONE-ACETAMINOPHEN 5-325 MG PO TABS
1.0000 | ORAL_TABLET | ORAL | Status: DC | PRN
  Administered 2018-02-24 – 2018-02-25 (×3): 2 via ORAL
  Filled 2018-02-23 (×3): qty 2

## 2018-02-23 MED ORDER — SUCCINYLCHOLINE CHLORIDE 20 MG/ML IJ SOLN
INTRAMUSCULAR | Status: DC | PRN
  Administered 2018-02-23: 150 mg via INTRAVENOUS

## 2018-02-23 MED ORDER — SUCCINYLCHOLINE CHLORIDE 20 MG/ML IJ SOLN
INTRAMUSCULAR | Status: AC
  Filled 2018-02-23: qty 1

## 2018-02-23 MED ORDER — FENTANYL CITRATE (PF) 250 MCG/5ML IJ SOLN
INTRAMUSCULAR | Status: AC
  Filled 2018-02-23: qty 5

## 2018-02-23 MED ORDER — SODIUM CHLORIDE 0.9 % IV SOLN
INTRAVENOUS | Status: DC | PRN
  Administered 2018-02-23: 70 mL

## 2018-02-23 MED ORDER — ONDANSETRON HCL 4 MG/2ML IJ SOLN
INTRAMUSCULAR | Status: AC
  Filled 2018-02-23: qty 2

## 2018-02-23 MED ORDER — SODIUM CHLORIDE FLUSH 0.9 % IV SOLN
INTRAVENOUS | Status: AC
  Filled 2018-02-23: qty 10

## 2018-02-23 MED ORDER — PROPOFOL 10 MG/ML IV BOLUS
INTRAVENOUS | Status: DC | PRN
  Administered 2018-02-23: 150 mg via INTRAVENOUS

## 2018-02-23 MED ORDER — FENTANYL CITRATE (PF) 100 MCG/2ML IJ SOLN
INTRAMUSCULAR | Status: DC | PRN
  Administered 2018-02-23 (×2): 50 ug via INTRAVENOUS
  Administered 2018-02-23: 100 ug via INTRAVENOUS
  Administered 2018-02-23 (×2): 50 ug via INTRAVENOUS

## 2018-02-23 MED ORDER — LIDOCAINE HCL (CARDIAC) PF 100 MG/5ML IV SOSY
PREFILLED_SYRINGE | INTRAVENOUS | Status: DC | PRN
  Administered 2018-02-23: 100 mg via INTRAVENOUS

## 2018-02-23 MED ORDER — DEXAMETHASONE SODIUM PHOSPHATE 10 MG/ML IJ SOLN
INTRAMUSCULAR | Status: DC | PRN
  Administered 2018-02-23: 5 mg via INTRAVENOUS

## 2018-02-23 MED ORDER — ONDANSETRON HCL 4 MG/2ML IJ SOLN: 4.0000 mg | Freq: Once | INTRAMUSCULAR | Status: DC | PRN

## 2018-02-23 MED ORDER — HYDROMORPHONE HCL 1 MG/ML IJ SOLN
INTRAMUSCULAR | Status: AC
  Filled 2018-02-23: qty 1

## 2018-02-23 MED ORDER — LACTATED RINGERS IV SOLN
INTRAVENOUS | Status: DC
  Administered 2018-02-23 (×2): via INTRAVENOUS

## 2018-02-23 MED ORDER — CEFAZOLIN SODIUM-DEXTROSE 2-3 GM-%(50ML) IV SOLR
INTRAVENOUS | Status: DC | PRN
  Administered 2018-02-23: 2 g via INTRAVENOUS

## 2018-02-23 MED ORDER — LIDOCAINE HCL (PF) 2 % IJ SOLN
INTRAMUSCULAR | Status: AC
  Filled 2018-02-23: qty 10

## 2018-02-23 MED ORDER — MIDAZOLAM HCL 2 MG/2ML IJ SOLN
INTRAMUSCULAR | Status: AC
  Filled 2018-02-23: qty 2

## 2018-02-23 MED ORDER — BUPIVACAINE HCL (PF) 0.5 % IJ SOLN
INTRAMUSCULAR | Status: AC
  Filled 2018-02-23: qty 30

## 2018-02-23 MED ORDER — ORAL CARE MOUTH RINSE
15.0000 mL | Freq: Two times a day (BID) | OROMUCOSAL | Status: DC
  Administered 2018-02-24 – 2018-03-10 (×17): 15 mL via OROMUCOSAL

## 2018-02-23 MED ORDER — ATORVASTATIN CALCIUM 10 MG PO TABS
10.0000 mg | ORAL_TABLET | Freq: Every day | ORAL | Status: DC
  Administered 2018-02-23 – 2018-03-10 (×16): 10 mg via ORAL
  Filled 2018-02-23 (×16): qty 1

## 2018-02-23 MED ORDER — DEXTROSE-NACL 5-0.45 % IV SOLN
INTRAVENOUS | Status: DC
  Administered 2018-02-23: 17:00:00 via INTRAVENOUS
  Administered 2018-02-24: 75 mL/h via INTRAVENOUS

## 2018-02-23 MED ORDER — ONDANSETRON HCL 4 MG/2ML IJ SOLN: 4.0000 mg | Freq: Four times a day (QID) | INTRAMUSCULAR | Status: DC | PRN

## 2018-02-23 MED ORDER — PROPOFOL 10 MG/ML IV BOLUS
INTRAVENOUS | Status: AC
  Filled 2018-02-23: qty 20

## 2018-02-23 MED ORDER — PHENYLEPHRINE HCL 10 MG/ML IJ SOLN
INTRAMUSCULAR | Status: DC | PRN
  Administered 2018-02-23: 50 ug via INTRAVENOUS

## 2018-02-23 MED ORDER — MIDAZOLAM HCL 2 MG/2ML IJ SOLN
INTRAMUSCULAR | Status: DC | PRN
  Administered 2018-02-23: 2 mg via INTRAVENOUS

## 2018-02-23 MED ORDER — SUGAMMADEX SODIUM 200 MG/2ML IV SOLN
INTRAVENOUS | Status: DC | PRN
  Administered 2018-02-23: 300 mg via INTRAVENOUS

## 2018-02-23 MED ORDER — DEXAMETHASONE SODIUM PHOSPHATE 10 MG/ML IJ SOLN
INTRAMUSCULAR | Status: AC
  Filled 2018-02-23: qty 1

## 2018-02-23 MED ORDER — ALBUTEROL SULFATE (2.5 MG/3ML) 0.083% IN NEBU
2.5000 mg | INHALATION_SOLUTION | RESPIRATORY_TRACT | Status: DC
  Administered 2018-02-23 – 2018-02-25 (×7): 2.5 mg via RESPIRATORY_TRACT
  Filled 2018-02-23 (×8): qty 3

## 2018-02-23 MED ORDER — ONDANSETRON HCL 4 MG/2ML IJ SOLN
INTRAMUSCULAR | Status: DC | PRN
  Administered 2018-02-23: 4 mg via INTRAVENOUS

## 2018-02-23 MED ORDER — CEFAZOLIN SODIUM-DEXTROSE 2-4 GM/100ML-% IV SOLN
2.0000 g | Freq: Three times a day (TID) | INTRAVENOUS | Status: AC
  Administered 2018-02-23 – 2018-02-24 (×2): 2 g via INTRAVENOUS
  Filled 2018-02-23 (×2): qty 100

## 2018-02-23 SURGICAL SUPPLY — 62 items
BENZOIN TINCTURE PRP APPL 2/3 (GAUZE/BANDAGES/DRESSINGS) ×3 IMPLANT
BNDG COHESIVE 4X5 TAN STRL (GAUZE/BANDAGES/DRESSINGS) IMPLANT
BRONCHOSCOPE PED SLIM DISP (MISCELLANEOUS) ×3 IMPLANT
CANISTER SUCT 1200ML W/VALVE (MISCELLANEOUS) ×3 IMPLANT
CATH THOR STR 28F  SOFT WA (CATHETERS) ×1
CATH THOR STR 28F SOFT WA (CATHETERS) ×2 IMPLANT
CATH URET ROBINSON 16FR STRL (CATHETERS) ×3 IMPLANT
CHLORAPREP W/TINT 26ML (MISCELLANEOUS) ×6 IMPLANT
CNTNR SPEC 2.5X3XGRAD LEK (MISCELLANEOUS) ×8
CONT SPEC 4OZ STER OR WHT (MISCELLANEOUS) ×4
CONTAINER SPEC 2.5X3XGRAD LEK (MISCELLANEOUS) ×8 IMPLANT
CUTTER ECHEON FLEX ENDO 45 340 (ENDOMECHANICALS) ×3 IMPLANT
DRAIN CHEST DRY SUCT SGL (MISCELLANEOUS) ×3 IMPLANT
DRAPE C-SECTION (MISCELLANEOUS) ×3 IMPLANT
DRAPE MAG INST 16X20 L/F (DRAPES) ×3 IMPLANT
DRSG OPSITE POSTOP 4X6 (GAUZE/BANDAGES/DRESSINGS) ×3 IMPLANT
DRSG OPSITE POSTOP 4X8 (GAUZE/BANDAGES/DRESSINGS) ×3 IMPLANT
DRSG TELFA 3X8 NADH (GAUZE/BANDAGES/DRESSINGS) ×3 IMPLANT
ELECT BLADE 6.5 EXT (BLADE) ×3 IMPLANT
ELECT CAUTERY BLADE TIP 2.5 (TIP) ×3
ELECT REM PT RETURN 9FT ADLT (ELECTROSURGICAL) ×3
ELECTRODE CAUTERY BLDE TIP 2.5 (TIP) ×2 IMPLANT
ELECTRODE REM PT RTRN 9FT ADLT (ELECTROSURGICAL) ×2 IMPLANT
GAUZE SPONGE 4X4 12PLY STRL (GAUZE/BANDAGES/DRESSINGS) ×3 IMPLANT
GLOVE SURG SYN 7.5  E (GLOVE) ×2
GLOVE SURG SYN 7.5 E (GLOVE) ×4 IMPLANT
GOWN STRL REUS W/ TWL LRG LVL3 (GOWN DISPOSABLE) ×6 IMPLANT
GOWN STRL REUS W/TWL LRG LVL3 (GOWN DISPOSABLE) ×3
KIT TURNOVER KIT A (KITS) ×3 IMPLANT
LABEL OR SOLS (LABEL) ×3 IMPLANT
LOOP RED MAXI  1X406MM (MISCELLANEOUS) ×1
LOOP VESSEL MAXI 1X406 RED (MISCELLANEOUS) ×2 IMPLANT
MARKER SKIN DUAL TIP RULER LAB (MISCELLANEOUS) ×3 IMPLANT
PACK BASIN MAJOR ARMC (MISCELLANEOUS) ×3 IMPLANT
Progel ×3 IMPLANT
RELOAD GOLD ECHELON 45 (STAPLE) ×21 IMPLANT
RELOAD STAPLER LINE PROX 30 GR (STAPLE) ×2 IMPLANT
SEALANT PROGEL (MISCELLANEOUS) ×3 IMPLANT
SPONGE KITTNER 5P (MISCELLANEOUS) ×3 IMPLANT
STAPLER RELOAD LINE PROX 30 GR (STAPLE) ×3
STAPLER SKIN PROX 35W (STAPLE) ×3 IMPLANT
STAPLER VASCULAR ECHELON 35 (CUTTER) IMPLANT
STRIP CLOSURE SKIN 1/2X4 (GAUZE/BANDAGES/DRESSINGS) ×3 IMPLANT
SUT MNCRL AB 3-0 PS2 27 (SUTURE) IMPLANT
SUT PROLENE 5 0 RB 1 DA (SUTURE) IMPLANT
SUT SILK 0 (SUTURE) ×1
SUT SILK 0 30XBRD TIE 6 (SUTURE) ×2 IMPLANT
SUT SILK 1 SH (SUTURE) ×21 IMPLANT
SUT VIC AB 0 CT1 36 (SUTURE) ×6 IMPLANT
SUT VIC AB 2-0 CT1 27 (SUTURE) ×2
SUT VIC AB 2-0 CT1 TAPERPNT 27 (SUTURE) ×4 IMPLANT
SUT VICRYL 2 TP 1 (SUTURE) ×9 IMPLANT
SYR 10ML SLIP (SYRINGE) ×3 IMPLANT
SYR BULB IRRIG 60ML STRL (SYRINGE) ×3 IMPLANT
TAPE ADH 3 LX (MISCELLANEOUS) ×3 IMPLANT
TAPE TRANSPORE STRL 2 31045 (GAUZE/BANDAGES/DRESSINGS) IMPLANT
TRAY FOLEY W/METER SILVER 16FR (SET/KITS/TRAYS/PACK) ×3 IMPLANT
TROCAR FLEXIPATH 20X80 (ENDOMECHANICALS) IMPLANT
TROCAR FLEXIPATH THORACIC 15MM (ENDOMECHANICALS) IMPLANT
TUBING CONNECTING 10 (TUBING) ×3 IMPLANT
WATER STERILE IRR 1000ML POUR (IV SOLUTION) ×3 IMPLANT
YANKAUER SUCT BULB TIP FLEX NO (MISCELLANEOUS) ×3 IMPLANT

## 2018-02-23 NOTE — Progress Notes (Signed)
   02/23/18 1850  Clinical Encounter Type  Visited With Patient  Visit Type Initial (order for advanced directive)  Referral From Nurse  Consult/Referral To Chaplain   Responded to order regarding advanced directive.  Patient expressed that he'd been meaning to do this and would like to complete one while in hospital.  However, he stated that he'd rather not discuss it now and would prefer a visit tomorrow.  Chaplain will leave consult open and pass along order during Wednesday morning report.

## 2018-02-23 NOTE — Op Note (Signed)
02/23/2018  3:27 PM  PATIENT:  Joseph Barron  67 y.o. male  PRE-OPERATIVE DIAGNOSIS: Adenocarcinoma right upper lobe  POST-OPERATIVE DIAGNOSIS: Adenocarcinoma right upper lobe  PROCEDURE: Preoperative bronchoscopy to assess endobronchial anatomy; muscle-sparing right thoracotomy with wedge resection of right upper lobe mass; pleural biopsy to rule out mesothelioma.  SURGEON:  Surgeon(s) and Role:    * Meera Vasco, Christia Reading, MD - Primary    * Pabon, Marjory Lies, MD - Assisting  ASSISTANTS: Dr. Marlis Edelson.  An assistant was required due to the complexity of the case and the lack of any other available help.  ANESTHESIA: General endotracheal anesthesia  INDICATIONS FOR PROCEDURE this patient is a 67 year old white male with an enlarging right upper lobe mass.  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  He was extensively evaluated and found to be a suitable candidate for surgery.  Due to the severity of his underlying lung disease he elected to undergo wedge resection only and did not elect to have a lobectomy performed.  He was apprised of the indications and risks of the various treatment options including surgical therapy and nonsurgical therapy.  He elected to consent to a right thoracotomy and right upper lobe wedge resection.  DICTATION: Patient is brought to the operating suite placed in supine position.  General endotracheal anesthesia was given through a double-lumen tube.  Preoperative bronchoscopy was carried out.  This was normal to the subsegmental levels bilaterally.  Specifically there is no evidence of tumor within the right upper lobe orifice.  The tube was secured at the proper position and then the patient was turned for a right thoracotomy.  All pressure points were carefully padded.  Patient was prepped and draped in usual sterile fashion.  A muscle-sparing right fifth interspace thoracotomy was performed.  The serratus and latissimus muscles were mobilized and retracted.  A  Tuffier retractor was placed and there were extensive adhesions at the apex of the lung.  These were taken down with electrocautery.  This allowed [place] then to mobilize the lung up into the wound.  The mass in the right upper lobe was then palpable.  It measured about 1.5 cm.  It was close to the middle lobe and I developed the fissure between the upper middle lobes using electrocautery and blunt dissection.  Once we had the tumor up out of the wound we are able to place a clamp on either side of the tumor and then used a combination of multiple firings of the endoscopic stapler to resect the tumor from the upper lobe.  The tumor was then sent to pathology for frozen section confirmed the presence of an adenocarcinoma with negative margins.  Hemostasis was complete and we then used 5 cc of pro-gel on the cut surface of the lung.  A single chest tube 24 French was inserted through our inferior incision and brought out through a separate stab wound.  It was positioned to the apex of the chest.  The wounds were then closed.  #2 Vicryl pericostal sutures were used to approximate the ribs.  The serratus and latissimus muscles were allowed to return to the normal anatomic position.  A Blake drain was inserted and then the subcutaneous tissues were closed with Vicryl and the skin was skin clips.  Sterile dressings were applied.  All sponge needle and instrument counts were correct as reported to me at the end of the case.  The patient was extubated and taken to the recovery room in stable condition.   Nestor Lewandowsky,  MD

## 2018-02-23 NOTE — Anesthesia Procedure Notes (Signed)
Procedure Name: Intubation Date/Time: 02/23/2018 12:16 PM Performed by: Gunnar Fusi, MD Pre-anesthesia Checklist: Patient identified, Emergency Drugs available, Suction available, Patient being monitored and Timeout performed Patient Re-evaluated:Patient Re-evaluated prior to induction Oxygen Delivery Method: Circle system utilized Preoxygenation: Pre-oxygenation with 100% oxygen Induction Type: IV induction Ventilation: Mask ventilation with difficulty, Two handed mask ventilation required and Oral airway inserted - appropriate to patient size Laryngoscope Size: Mac and 4 Grade View: Grade I Endobronchial tube: Left and 39 Fr Number of attempts: 1 Airway Equipment and Method: Stylet and Fiberoptic brochoscope Placement Confirmation: ETT inserted through vocal cords under direct vision,  positive ETCO2 and breath sounds checked- equal and bilateral Secured at: 29 cm Tube secured with: Tape Dental Injury: Teeth and Oropharynx as per pre-operative assessment

## 2018-02-23 NOTE — Anesthesia Preprocedure Evaluation (Signed)
Anesthesia Evaluation  Patient identified by MRN, date of birth, ID band Patient awake    Reviewed: Allergy & Precautions, H&P , NPO status , Patient's Chart, lab work & pertinent test results, reviewed documented beta blocker date and time   Airway Mallampati: II  TM Distance: >3 FB Neck ROM: full    Dental  (+) Edentulous Upper, Edentulous Lower   Pulmonary neg sleep apnea, neg COPD, former smoker,    Pulmonary exam normal breath sounds clear to auscultation- rhonchi (-) wheezing      Cardiovascular hypertension, Pt. on medications (-) CAD, (-) Past MI, (-) Cardiac Stents and (-) CABG Normal cardiovascular exam Rhythm:regular Rate:Normal - Systolic murmurs and - Diastolic murmurs    Neuro/Psych negative neurological ROS  negative psych ROS   GI/Hepatic Neg liver ROS, GERD  Medicated,  Endo/Other  negative endocrine ROSneg diabetes  Renal/GU negative Renal ROS  negative genitourinary   Musculoskeletal   Abdominal (+) - obese,   Peds  Hematology negative hematology ROS (+)   Anesthesia Other Findings   Reproductive/Obstetrics negative OB ROS                             Anesthesia Physical  Anesthesia Plan  ASA: II  Anesthesia Plan: General   Post-op Pain Management:    Induction:   PONV Risk Score and Plan: 1 and Propofol infusion  Airway Management Planned: Oral ETT and Double Lumen EBT  Additional Equipment:   Intra-op Plan:   Post-operative Plan:   Informed Consent: I have reviewed the patients History and Physical, chart, labs and discussed the procedure including the risks, benefits and alternatives for the proposed anesthesia with the patient or authorized representative who has indicated his/her understanding and acceptance.     Plan Discussed with: CRNA  Anesthesia Plan Comments:         Anesthesia Quick Evaluation

## 2018-02-23 NOTE — Op Note (Signed)
  02/23/2018  3:37 PM  PATIENT:  Joseph Barron  67 y.o. male  PRE-OPERATIVE DIAGNOSIS: Right upper lobe mass  POST-OPERATIVE DIAGNOSIS: Right upper lobe mass  PROCEDURE: Preoperative bronchoscopy to assess endobronchial anatomy; right muscle-sparing thoracotomy with wedge resection of right upper lobe mass; pleural biopsy to rule out mesothelioma  SURGEON:  Surgeon(s) and Role:    * Nestor Lewandowsky, MD - Primary    * Pabon, Marjory Lies, MD - Assisting  ASSISTANTS: Dr. Marlis Edelson who is required due to the complexity of the case and the lack of any other assistance  ANESTHESIA: General  INDICATIONS FOR PROCEDURE PET positive right upper lobe mass  DICTATION: Please note that this is a addendum to the previously dictated operative report.  This is the addendum to only document the biopsy of the pleural mass.  With the chest open we could see that there were several whitish plaques which had some scarlike areas and also some papular excrescences.  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  Using a cup biopsy forceps multiple random biopsies of the largest plaque which measured about 3 cm in size was performed.  These were sent for permanent section.  The remainder of the operative report is as per dictation prior to this.   Nestor Lewandowsky, MD

## 2018-02-23 NOTE — Consult Note (Signed)
Name: Joseph Barron MRN: [id number] DOB: 03/03/1951    ADMISSION DATE:  02/23/2018 CONSULTATION DATE: 02/23/2018  REFERRING MD : Dr. Genevive Bi   CHIEF COMPLAINT: Elective Right Thoracotomy   BRIEF PATIENT DESCRIPTION:  67 yo male with enlarging adenocarcinoma of RUL admitted for elective muscle sparing right thoracotomy with wedge resection of right upper lobe mass and pleural biopsy to rule out mesothelioma   SIGNIFICANT EVENTS  04/30-Pt admitted to ICU s/p right thoracotomy with wedge resection   STUDIES:  CT Chest Lung Cancer Screening 04/30>>Lung-RADS 4BS, suspicious. Additional imaging evaluation or consultation with Pulmonology or Thoracic Surgery recommended. The "S" modifier above refers to potentially clinically significant non lung cancer related findings. Specifically, there is a aortic atherosclerosis, in addition to 2 vessel coronary artery disease. Please note that although the presence of coronary artery calcium documents the presence of coronary artery disease, the severity of this disease and any potential stenosis cannot be assessed on this non-gated CT examination. Assessment for potential risk factor modification, dietary therapy or pharmacologic therapy may be warranted, if clinically indicated. Mild diffuse bronchial thickening with moderate centrilobular and paraseptal   HISTORY OF PRESENT ILLNESS:   This is a 67 yo male with a PMH of Pulmonary Nodules, HTN, Hyperlipidemia, GERD, Enlarging Adenocarcinoma of RUL, and Borderline Diabetes Mellitus. He presented to [facility] on 04/20 for an elective muscle sparing right thoracotomy with wedge resection of right upper lobe mass and pleural biopsy to rule out mesothelioma.  During procedure a single 61 French chest tube was inserted at the apex of the chest.  He was subsequently admitted to ICU postop for further workup and treatment.   PAST MEDICAL HISTORY :   has a past medical history of Borderline diabetes mellitus  (08/06/2017), Cancer ([place]), GERD (gastroesophageal reflux disease), Hyperlipemia, Hypertension, Pulmonary nodules (01/13/2018), and Vaccine counseling (12/22/2017).  has a past surgical history that includes Eye surgery (Left, child) and Colonoscopy with propofol (N/A, 02/22/2018). Prior to Admission medications   Medication Sig Start Date End Date Taking? Authorizing Provider  atorvastatin (LIPITOR) 10 MG tablet Take 10 mg by mouth at bedtime.  01/13/18  Yes [provider]  lisinopril (PRINIVIL,ZESTRIL) 20 MG tablet Take 20 mg by mouth at bedtime.  01/13/18  Yes [provider]  metoprolol succinate (TOPROL-XL) 25 MG 24 hr tablet Take 25 mg by mouth at bedtime.  01/25/18 01/25/19 Yes [provider]  omeprazole (PRILOSEC) 20 MG capsule Take 40 mg by mouth daily.  12/22/17 12/22/18 Yes [provider]   No Known Allergies  FAMILY HISTORY:  family history includes Alcoholism in his father; Arthritis in his mother; Bowel Disease in his father; COPD in his father and sister; Coronary artery disease in his father; Diabetes in his brother; Liver cancer in his brother; Prostatitis in his brother; Thyroid disease in his sister and sister; Transient ischemic attack in his father. SOCIAL HISTORY:  reports that he quit smoking about 3 months ago. His smoking use included cigarettes. He has a 50.00 pack-year smoking history. He quit smokeless tobacco use about 29 years ago. His smokeless tobacco use included chew. He reports that he drinks about 5.4 oz of alcohol per week. He reports that he does not use drugs.  REVIEW OF SYSTEMS: Positives in BOLD  Constitutional: Negative for fever, chills, weight loss, malaise/fatigue and diaphoresis.  HENT: Negative for hearing loss, ear pain, nosebleeds, congestion, sore throat, neck pain, tinnitus and ear discharge.   Eyes: Negative for blurred vision, double vision, photophobia,  pain, discharge and redness.  Respiratory: Negative for cough,  hemoptysis, sputum production, shortness of breath, wheezing and stridor.   Cardiovascular: Negative for chest pain, palpitations, orthopnea, claudication, leg swelling and PND.  Gastrointestinal: Negative for heartburn, nausea, vomiting, abdominal pain, diarrhea, constipation, blood in stool and melena.  Genitourinary: Negative for dysuria, urgency, frequency, hematuria and flank pain.  Musculoskeletal: Negative for myalgias, back pain, joint pain and falls.  Skin: Negative for itching and rash.  Neurological: Negative for dizziness, tingling, tremors, sensory change, speech change, focal weakness, seizures, loss of consciousness, weakness and headaches.  Endo/Heme/Allergies: Negative for environmental allergies and polydipsia. Does not bruise/bleed easily.  SUBJECTIVE:  No complaints at this time   VITAL SIGNS: Temp:  [97 F (36.1 C)-97.6 F (36.4 C)] 97.6 F (36.4 C) (04/30 1745) Pulse Rate:  [61-80] 70 (04/30 1800) Resp:  [9-20] 19 (04/30 1800) BP: (127-167)/(71-86) 149/86 (04/30 1800) SpO2:  [88 %-100 %] 96 % (04/30 1800) Weight:  [71.9 kg (158 lb 8.2 oz)] 71.9 kg (158 lb 8.2 oz) (04/30 1708)  PHYSICAL EXAMINATION: General: well developed, well nourished male, NAD Neuro: alert and oriented, follows commands HEENT: supple, no JVD Cardiovascular: nsr, rrr, no M/R/G Lungs: clear on left, diminished throughout right, chest tube and jp drain at right apex with sanguineous drainage  Abdomen: +BS x4, soft, non tender, non distended  Musculoskeletal: normal bulk and tone, no edema  Skin: 2 incision sites present right side of chest dressings dry and intact   Recent Labs  Lab 02/18/18 1449  NA 139  K 3.8  CL 106  CO2 26  BUN 12  CREATININE 1.29*  GLUCOSE 130*   Recent Labs  Lab 02/18/18 1449  HGB 13.6  HCT 39.9*  WBC 9.7  PLT 309   Dg Chest Port 1 View  Result Date: 02/23/2018 CLINICAL DATA:  Status post right thoracotomy. History of bronchogenic malignancy of the  right lung. EXAM: PORTABLE CHEST 1 VIEW COMPARISON:  Preoperative study of February 18, 2018 FINDINGS: The lungs are less well inflated today. The interstitial markings are mildly increased but not greatly changed from the previous study. There is small amount of increased parenchymal density in the right mid lung at the surgical site. There is no pneumothorax or significant pleural effusion. There are 2 chest tubes in place 1 of which likely reflects a wound VAC. The heart is top-normal in size. The pulmonary vascularity is normal. There is calcification in the wall of the aortic arch. IMPRESSION: Mild hypoinflation. Chronic interstitial prominence stable. Mild postsurgical change in the right lung. No pneumothorax or significant pleural effusion. Thoracic aortic atherosclerosis. Electronically Signed   By: David  Martinique M.D.   On: 02/23/2018 16:43    ASSESSMENT / PLAN: Adenocarcinoma of RUL s/p right thoracotomy with wedge resection of right upper lobe mass Postop Pain  Hx: HTN, Hyperlipidemia, GERD, Borderline Diabetes Mellitus  P: Supplemental O2 for dyspnea and/or hypoxia  Scheduled bronchodilator therapy  Pulmonary hygiene  Repeat CXR in am  Continuous telemetry monitoring  Continue outpatient cardiac medications Trend BMP  Replace electrolytes as indicated  Monitor UOP VTE px: SCD's Trend CBC Monitor for s/sx of bleeding and transfuse for hgb <7 Prn percocet and morphine for pain management Cardiothoracic surgery primary PCCM will assist with plan of care while pt in ICU  Marda Stalker, [place] Pager [phone] (please enter 7 digits) PCCM Consult Pager [phone] (please enter 7 digits)

## 2018-02-23 NOTE — Anesthesia Post-op Follow-up Note (Signed)
Anesthesia QCDR form completed.        

## 2018-02-23 NOTE — Interval H&P Note (Signed)
History and Physical Interval Note:  02/23/2018 11:47 AM  Joseph Barron  has presented today for surgery, with the diagnosis of right lung mass  The various methods of treatment have been discussed with the patient and family. After consideration of risks, benefits and other options for treatment, the patient has consented to  Procedure(s): PREOP BRONCHOSCOPY (N/A) THORACOTOMY/ WEDGE RESECTION (Right) as a surgical intervention .  The patient's history has been reviewed, patient examined, no change in status, stable for surgery.  I have reviewed the patient's chart and labs.  Questions were answered to the patient's satisfaction.     Nestor Lewandowsky

## 2018-02-23 NOTE — Transfer of Care (Signed)
Immediate Anesthesia Transfer of Care Note  Patient: Joseph Barron  Procedure(s) Performed: PREOP BRONCHOSCOPY (N/A ) THORACOTOMY/ WEDGE RESECTION (Right )  Patient Location: PACU  Anesthesia Type:MAC  Level of Consciousness: awake, alert  and oriented  Airway & Oxygen Therapy: Patient Spontanous Breathing and Patient connected to face mask oxygen  Post-op Assessment: Report given to RN and Post -op Vital signs reviewed and stable  Post vital signs: Reviewed and stable  Last Vitals:  Vitals Value Taken Time  BP 167/79 02/23/2018  3:20 PM  Temp 36.3 C 02/23/2018  3:20 PM  Pulse    Resp 9 02/23/2018  3:20 PM  SpO2 99 % 02/23/2018  3:20 PM    Last Pain:  Vitals:   02/23/18 1520  TempSrc: Temporal         Complications: No apparent anesthesia complications

## 2018-02-24 ENCOUNTER — Encounter: Payer: Self-pay | Admitting: Cardiothoracic Surgery

## 2018-02-24 DIAGNOSIS — R739 Hyperglycemia, unspecified: Secondary | ICD-10-CM

## 2018-02-24 MED ORDER — SODIUM CHLORIDE 0.9 % IV SOLN
INTRAVENOUS | Status: DC
  Administered 2018-02-24: 14:00:00 via INTRAVENOUS

## 2018-02-24 NOTE — Progress Notes (Signed)
   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  has a past surgical history that includes Eye surgery (Left, child) and Colonoscopy with propofol (N/A, 02/22/2018). Prior to Admission medications   Medication Sig Start Date End Date Taking? Authorizing Provider  atorvastatin (LIPITOR) 10 MG tablet Take 10 mg by mouth at bedtime.  01/13/18  Yes [provider]  lisinopril (PRINIVIL,ZESTRIL) 20 MG tablet Take 20 mg by mouth at bedtime.  01/13/18  Yes [provider]  metoprolol succinate (TOPROL-XL) 25 MG 24 hr tablet Take 25 mg by mouth at bedtime.  01/25/18 01/25/19 Yes [provider]  omeprazole (PRILOSEC) 20 MG capsule Take 40 mg by mouth daily.  12/22/17 12/22/18 Yes [provider]   No Known Allergies  FAMILY HISTORY:  family history includes Alcoholism in his father; Arthritis in his mother; Bowel Disease in his father; COPD in his father and sister; Coronary artery disease in his father; Diabetes in his brother; Liver cancer in his brother; Prostatitis in his brother; Thyroid disease in his sister and sister; Transient ischemic attack in his father. SOCIAL HISTORY:  reports that he quit smoking about 3 months ago. His smoking use included cigarettes. He has a 50.00 pack-year smoking history. He quit smokeless tobacco use about 29 years ago. His smokeless tobacco use included chew. He reports that he drinks about 5.4 oz of alcohol per week. He reports that he does not use drugs.  REVIEW OF SYSTEMS:   Unable to obtain due to critical illness   VITAL SIGNS: Temp:  [97 F (36.1 C)-97.8 F (36.6 C)] 97.8 F (36.6 C)  (05/01 0400) Pulse Rate:  [61-86] 62 (05/01 0600) Resp:  [9-20] 13 (05/01 0600) BP: (95-167)/(53-86) 95/53 (05/01 0600) SpO2:  [88 %-100 %] 97 % (05/01 0600) FiO2 (%):  [32 %] 32 % (04/30 2008) Weight:  [71.9 kg (158 lb 8.2 oz)] 71.9 kg (158 lb 8.2 oz) (04/30 1708)  Physical Examination:  A + O and no acute neuro deficits On [place], no distress, able to talk in full sentences, BEAE and no rales. Rt. Chest and JPdrain in place, s/p Rt thoracotomy. 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  HTN -Optimize antihypertensives and monitor hemodynamics  AKI -Optimize hydration, avoid nephrotoxins, monitor renal panel and urine out put  Hyperglycemia -monitor BG.  Dyslipidemia -Statin  Full code  DVT & GI prophylaxis. Continue with supportive care

## 2018-02-24 NOTE — Progress Notes (Signed)
Patient ID: Joseph Barron, male   DOB: 02-17-1951, 67 y.o.   MRN: [id number]  He has no complaints today.  He states his pain is minimal and he is not short of breath.  He has been using his incentive spirometer.  His chest tube drainage is serosanguineous as is his JP drainage.  Vital signs are stable and he is afebrile.  His wounds are clean dry and intact.  His lungs are diminished bilaterally but equal.  His heart is regular.  There is no air leak with a moderate cough.  We will transfer the patient to the floor.  We will begin physical therapy.  We will try to wean his oxygen.

## 2018-02-24 NOTE — Anesthesia Postprocedure Evaluation (Signed)
Anesthesia Post Note  Patient: Joseph Barron  Procedure(s) Performed: PREOP BRONCHOSCOPY (N/A ) THORACOTOMY/ WEDGE RESECTION (Right )  Patient location during evaluation: ICU Anesthesia Type: General Level of consciousness: oriented and awake and alert Pain management: pain level controlled Vital Signs Assessment: post-procedure vital signs reviewed and stable Respiratory status: spontaneous breathing, respiratory function stable and patient connected to nasal cannula oxygen Cardiovascular status: blood pressure returned to baseline and stable Postop Assessment: no headache, no backache and no apparent nausea or vomiting Anesthetic complications: no     Last Vitals:  Vitals:   02/24/18 0500 02/24/18 0600  BP: 116/63 (!) 95/53  Pulse: 71 62  Resp: 18 13  Temp:    SpO2: 96% 97%    Last Pain:  Vitals:   02/24/18 0400  TempSrc: Oral  PainSc: 0-No pain                 Alison Stalling

## 2018-02-24 NOTE — Progress Notes (Signed)
Pt being transferred to room 201. Report called to [place], Therapist, sports. Pt and belongings transferred to room 201 without incident.

## 2018-02-24 NOTE — Progress Notes (Signed)
   02/24/18 1000  Clinical Encounter Type  Visited With Patient  Visit Type Follow-up;Other (Comment) (HCPOA)  Referral From Nurse  Spiritual Encounters  Spiritual Needs Literature;Emotional  HCPOA/AD materials dropped off with patient.  [place] reviewed materials briefly.  Patient indicated that he will wait to review with daughter. [place] available as needed.

## 2018-02-24 NOTE — Anesthesia Postprocedure Evaluation (Signed)
Anesthesia Post Note  Patient: Joseph Barron  Procedure(s) Performed: COLONOSCOPY WITH PROPOFOL (N/A )  Patient location during evaluation: PACU Anesthesia Type: General Level of consciousness: awake and alert Pain management: pain level controlled Vital Signs Assessment: post-procedure vital signs reviewed and stable Respiratory status: spontaneous breathing, nonlabored ventilation, respiratory function stable and patient connected to nasal cannula oxygen Cardiovascular status: blood pressure returned to baseline and stable Postop Assessment: no apparent nausea or vomiting Anesthetic complications: no     Last Vitals:  Vitals:   02/22/18 1404 02/22/18 1414  BP: (!) 142/70   Pulse: 73 67  Resp: 18 16  Temp:    SpO2: 92% 95%    Last Pain:  Vitals:   02/23/18 0736  TempSrc:   PainSc: 0-No pain                 Molli Barrows

## 2018-02-25 ENCOUNTER — Inpatient Hospital Stay: Payer: BLUE CROSS/BLUE SHIELD

## 2018-02-25 LAB — BPAM RBC
BLOOD PRODUCT EXPIRATION DATE: 201905182359
BLOOD PRODUCT EXPIRATION DATE: 201905282359
UNIT TYPE AND RH: 9500
UNIT TYPE AND RH: 9500

## 2018-02-25 LAB — BASIC METABOLIC PANEL
Anion gap: 6 (ref 5–15)
BUN: 21 mg/dL — AB (ref 6–20)
CHLORIDE: 103 mmol/L (ref 101–111)
CO2: 30 mmol/L (ref 22–32)
CREATININE: 1.36 mg/dL — AB (ref 0.61–1.24)
Calcium: 9.1 mg/dL (ref 8.9–10.3)
GFR calc non Af Amer: 52 mL/min — ABNORMAL LOW (ref >60)
Glucose, Bld: 116 mg/dL — ABNORMAL HIGH (ref 65–99)
Potassium: 4.5 mmol/L (ref 3.5–5.1)
SODIUM: 139 mmol/L (ref 135–145)

## 2018-02-25 LAB — CBC WITH DIFFERENTIAL/PLATELET
BASOS PCT: 1 %
Basophils Absolute: 0.1 10*3/uL (ref 0–0.1)
EOS PCT: 4 %
Eosinophils Absolute: 0.4 10*3/uL (ref 0–0.7)
HEMATOCRIT: 39.6 % — AB (ref 40.0–52.0)
Hemoglobin: 13 g/dL (ref 13.0–18.0)
Lymphocytes Relative: 23 %
Lymphs Abs: 2.7 10*3/uL (ref 1.0–3.6)
MCH: 31.4 pg (ref 26.0–34.0)
MCHC: 32.9 g/dL (ref 32.0–36.0)
MCV: 95.4 fL (ref 80.0–100.0)
MONO ABS: 1.2 10*3/uL — AB (ref 0.2–1.0)
MONOS PCT: 10 %
NEUTROS ABS: 7.1 10*3/uL — AB (ref 1.4–6.5)
Neutrophils Relative %: 62 %
PLATELETS: 257 10*3/uL (ref 150–440)
RBC: 4.15 MIL/uL — ABNORMAL LOW (ref 4.40–5.90)
RDW: 14.1 % (ref 11.5–14.5)
WBC: 11.5 10*3/uL — ABNORMAL HIGH (ref 3.8–10.6)

## 2018-02-25 LAB — TYPE AND SCREEN
ABO/RH(D): O NEG
Antibody Screen: NEGATIVE
UNIT DIVISION: 0
Unit division: 0

## 2018-02-25 LAB — GLUCOSE, CAPILLARY: GLUCOSE-CAPILLARY: 120 mg/dL — AB (ref 65–99)

## 2018-02-25 LAB — PREPARE RBC (CROSSMATCH)

## 2018-02-25 LAB — MAGNESIUM: MAGNESIUM: 1.8 mg/dL (ref 1.7–2.4)

## 2018-02-25 LAB — PHOSPHORUS: PHOSPHORUS: 2.9 mg/dL (ref 2.5–4.6)

## 2018-02-25 MED ORDER — ALBUTEROL SULFATE (2.5 MG/3ML) 0.083% IN NEBU
2.5000 mg | INHALATION_SOLUTION | Freq: Two times a day (BID) | RESPIRATORY_TRACT | Status: DC
  Administered 2018-02-25 – 2018-03-09 (×24): 2.5 mg via RESPIRATORY_TRACT
  Filled 2018-02-25 (×24): qty 3

## 2018-02-25 MED ORDER — BISACODYL 10 MG RE SUPP
10.0000 mg | Freq: Every day | RECTAL | Status: DC
  Administered 2018-02-25 – 2018-02-26 (×2): 10 mg via RECTAL
  Filled 2018-02-25 (×3): qty 1

## 2018-02-25 NOTE — Plan of Care (Signed)
Pt worked with PT today and sat in the chair

## 2018-02-25 NOTE — Evaluation (Signed)
Physical Therapy Evaluation Patient Details Name: Joseph Barron MRN: [id number] DOB: 12-Mar-1951 Today's Date: 02/25/2018   History of Present Illness  Pt is a 66 year old white male with an enlarging right upper lobe mass. 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 He was extensively evaluated and found to be a suitable candidate for surgery.  Pt is s/p R thoracotomy with wedge resection of right upper lobe mass.  PMH includes: borderline DM, cancer, GERD, HLD, HTN, and pulmonary nodules.    Clinical Impression  Pt presents with deficits in strength, transfers, mobility, gait, and activity tolerance.  Pt required extra time and effort with bed mobility tasks but no physical assistance.  Pt was steady with good control during transfers.  Pt's baseline SpO2 and HR on 2.5LO2/min were 90% and 85 bpm.  After therex SpO2 increased to 93$ with HR 83 bpm.  After amb 40' SpO2 dropped to 81% with HR 104 bpm with pt reporting only minimal SOB.  Pt's SpO2 and HR returned to near baseline levels after sitting for 60 sec with cues for PLB.  Pt will benefit from HHPT services upon discharge to safely address above deficits for decreased caregiver assistance and eventual return to PLOF.       Follow Up Recommendations Home health PT    Equipment Recommendations  Rolling walker with 5" wheels    Recommendations for Other Services       Precautions / Restrictions Precautions Precautions: Fall Precaution Comments: Multiple incisions/drains on the R side of the chest; may place chest tube to water seal during ambulation   Restrictions Weight Bearing Restrictions: No      Mobility  Bed Mobility Overal bed mobility: Modified Independent             General bed mobility comments: Extra time and effort with bed mobility tasks but no physical assistance required  Transfers Overall transfer level: Needs assistance Equipment used: Rolling walker (2 wheeled) Transfers: Sit to/from Stand Sit to  Stand: Supervision         General transfer comment: Pt steady with good control during transfers  Ambulation/Gait Ambulation/Gait assistance: Supervision Ambulation Distance (Feet): 40 Feet Assistive device: Rolling walker (2 wheeled) Gait Pattern/deviations: Step-through pattern;Decreased step length - right;Decreased step length - left     General Gait Details: Slow cadence with amb but steady without LOB; SpO2 on 3LO2/min down to 81% with HR 104 bpm from baselines of 90% and 85 bpm.  Both returned to near baseline levels after sitting with PLB for 60 sec.    Stairs            Wheelchair Mobility    Modified Rankin (Stroke Patients Only)       Balance Overall balance assessment: No apparent balance deficits (not formally assessed)                                           Pertinent Vitals/Pain Pain Assessment: No/denies pain    Home Living Family/patient expects to be discharged to:: Private residence Living Arrangements: Children Available Help at Discharge: Family;Available PRN/intermittently Type of Home: Mobile home Home Access: Stairs to enter Entrance Stairs-Rails: Right;Left;Can reach both Entrance Stairs-Number of Steps: 4 Home Layout: One level Home Equipment: None      Prior Function Level of Independence: Independent         Comments: Pt Ind community ambulator without AD, works FT as a  roofer, no fall history, Ind with ADLs     Hand Dominance        Extremity/Trunk Assessment   Upper Extremity Assessment Upper Extremity Assessment: Overall WFL for tasks assessed    Lower Extremity Assessment Lower Extremity Assessment: Generalized weakness       Communication   Communication: No difficulties  Cognition Arousal/Alertness: Awake/alert Behavior During Therapy: WFL for tasks assessed/performed Overall Cognitive Status: Within Functional Limits for tasks assessed                                         General Comments      Exercises Total Joint Exercises Ankle Circles/Pumps: AROM;Both;10 reps Quad Sets: Strengthening;Both;10 reps Gluteal Sets: Strengthening;Both;10 reps Heel Slides: AROM;Both;5 reps Hip ABduction/ADduction: AROM;Both;5 reps Long Arc Quad: AROM;Both;10 reps Knee Flexion: AROM;Both;10 reps Marching in Standing: AROM;Both;10 reps;Seated;Standing   Assessment/Plan    PT Assessment Patient needs continued PT services  PT Problem List Decreased strength;Decreased activity tolerance;Decreased mobility       PT Treatment Interventions DME instruction;Gait training;Stair training;Functional mobility training;Balance training;Therapeutic exercise;Therapeutic activities;Patient/family education    PT Goals (Current goals can be found in the Care Plan section)  Acute Rehab PT Goals Patient Stated Goal: To be able to get around better and do what I want PT Goal Formulation: With patient Time For Goal Achievement: 03/10/18 Potential to Achieve Goals: Good    Frequency Min 2X/week   Barriers to discharge        Co-evaluation               AM-PAC PT "6 Clicks" Daily Activity  Outcome Measure Difficulty turning over in bed (including adjusting bedclothes, sheets and blankets)?: A Little Difficulty moving from lying on back to sitting on the side of the bed? : A Little Difficulty sitting down on and standing up from a chair with arms (e.g., wheelchair, bedside commode, etc,.)?: None Help needed moving to and from a bed to chair (including a wheelchair)?: None Help needed walking in hospital room?: None Help needed climbing 3-5 steps with a railing? : A Little 6 Click Score: 21    End of Session Equipment Utilized During Treatment: Oxygen Activity Tolerance: Patient tolerated treatment well;Other (comment)(Pt reported only minimal SOB during amb but SpO2 dropped to 81%, nursing notified) Patient left: in chair;with chair alarm set;with call  bell/phone within reach;with family/visitor present Nurse Communication: Mobility status;Other (comment)(Vital sign response to amb) PT Visit Diagnosis: Muscle weakness (generalized) (M62.81);Difficulty in walking, not elsewhere classified (R26.2)    Time: 2876-8115 PT Time Calculation (min) (ACUTE ONLY): 40 min   Charges:   PT Evaluation $PT Eval Low Complexity: 1 Low PT Treatments $Therapeutic Exercise: 8-22 mins   PT G Codes:        DRoyetta Asal PT, DPT 02/25/18, 11:54 AM

## 2018-02-25 NOTE — Progress Notes (Signed)
Patient ID: Joseph Barron, male   DOB: [birth year], 67 y.o.   MRN: [id number]  No complaints  Pain under good control.  No BM yet though he has a very good apetite.  Afebrile  Lungs with coarse breath sounds bilaterally. Heart Regular  No air leak seen.  CXRay shows a small lateral pneumothorax depite no air leak  Wounds clean and dry  Dressings changed  Will place to water seal Repeat CXRay later today  [employer].

## 2018-02-26 ENCOUNTER — Other Ambulatory Visit: Payer: Self-pay | Admitting: Pathology

## 2018-02-26 LAB — CALCIUM, IONIZED: Calcium, Ionized, Serum: 5.1 mg/dL (ref 4.5–5.6)

## 2018-02-26 LAB — SURGICAL PATHOLOGY

## 2018-02-26 NOTE — Progress Notes (Signed)
SATURATION QUALIFICATIONS: (This note is used to comply with regulatory documentation for home oxygen)  Patient Saturations on Room Air at Rest = 93%  Patient Saturations on Room Air while Ambulating = 88%  Patient Saturations on 2 Liters of oxygen while Ambulating = 91%  Please briefly explain why patient needs home oxygen:

## 2018-02-26 NOTE — Progress Notes (Signed)
Patient ID: Joseph Barron, male   DOB: 1951-01-30, 67 y.o.   MRN: [id number]  Pain under good control.  Still not very hungry.  Had BM today but would like an enema.    Lungs slightly decreased on right side Heart regular Abdomen is distended with normal and active bowel sounds  No air leak.  Minimal CT drainage.  Serous JP draining serosanguinous fluid  Will order enema Check on Pathology Encourage ambulation Will likely need O2 on discharge  Tim Shanicqua Coldren.

## 2018-02-26 NOTE — Care Management Important Message (Signed)
Copy of signed IM left in patient's room.    

## 2018-02-26 NOTE — Care Management (Signed)
Patient s/p R thoracotomy with wedge resection of right upper lobe mass.  Patient currently with chest tube in place to water seal.  Patient lives at home with daughter.  PCP [place].  Patient states that he does not have any medical equipment in the home.  Pharmacy [facility].  Denies issues obtaining medications. PT has assessed patient and recommends home health PT.  Patient agreeable to home health, states he does not have preference of home health agency.  Heads up referral made to [facility] with [place] for home health, RW, and potential o2.  Will need qualifying O2 sats prior to discharge RNCM following.

## 2018-02-27 ENCOUNTER — Inpatient Hospital Stay: Payer: BLUE CROSS/BLUE SHIELD

## 2018-02-27 NOTE — Progress Notes (Signed)
SS enema given to pt as ordered, pt tol well, med size bm noted.

## 2018-02-27 NOTE — Progress Notes (Signed)
Physical Therapy Treatment Patient Details Name: Joseph Barron MRN: [id number] DOB: [birth year] Today's Date: 02/27/2018    History of Present Illness Pt is a 67 year old white male with an enlarging right upper lobe mass. 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 He was extensively evaluated and found to be a suitable candidate for surgery.  Pt is s/p R thoracotomy with wedge resection of right upper lobe mass.  PMH includes: borderline DM, cancer, GERD, HLD, HTN, and pulmonary nodules.    PT Comments    Pt transitioned out of bed and ambulated 140' x  1 on unit with walker and supervision.  Overall steady gait with no LOB or buckling.  Pt with productive cough during session.  O2 sats increased with mobility and in low 90's upon return to chair.   Of note, telemetry notified RN during gait that HR had decreased into 40's.  HR was 73 at end of session.  Monitor during gait next session.   Follow Up Recommendations  Home health PT     Equipment Recommendations  Rolling walker with 5" wheels    Recommendations for Other Services       Precautions / Restrictions Precautions Precautions: Fall Precaution Comments: Multiple incisions/drains on the R side of the chest; may place chest tube to water seal during ambulation   Restrictions Weight Bearing Restrictions: No    Mobility  Bed Mobility                  Transfers Overall transfer level: Modified independent Equipment used: None             General transfer comment: Pt steady with good control during transfers  Ambulation/Gait Ambulation/Gait assistance: Supervision Ambulation Distance (Feet): 140 Feet Assistive device: Rolling walker (2 wheeled) Gait Pattern/deviations: Step-through pattern   Gait velocity interpretation: <1.8 ft/sec, indicate of risk for recurrent falls     Stairs             Wheelchair Mobility    Modified Rankin (Stroke Patients Only)       Balance Overall balance  assessment: No apparent balance deficits (not formally assessed)                                          Cognition Arousal/Alertness: Awake/alert Behavior During Therapy: WFL for tasks assessed/performed Overall Cognitive Status: Within Functional Limits for tasks assessed                                        Exercises      General Comments        Pertinent Vitals/Pain Pain Assessment: No/denies pain    Home Living                      Prior Function            PT Goals (current goals can now be found in the care plan section) Progress towards PT goals: Progressing toward goals    Frequency    Min 2X/week      PT Plan Current plan remains appropriate    Co-evaluation              AM-PAC PT "6 Clicks" Daily Activity  Outcome Measure  Difficulty turning over in bed (including adjusting bedclothes, sheets and blankets)?:  None Difficulty moving from lying on back to sitting on the side of the bed? : None Difficulty sitting down on and standing up from a chair with arms (e.g., wheelchair, bedside commode, etc,.)?: None Help needed moving to and from a bed to chair (including a wheelchair)?: None Help needed walking in hospital room?: None Help needed climbing 3-5 steps with a railing? : A Little 6 Click Score: 23    End of Session Equipment Utilized During Treatment: Oxygen Activity Tolerance: Patient tolerated treatment well;Other (comment) Patient left: in chair;with call bell/phone within reach Nurse Communication: Other (comment)       Time: 4196-2229 PT Time Calculation (min) (ACUTE ONLY): 15 min  Charges:  $Gait Training: 8-22 mins                    G Codes:       Chesley Noon, PTA 02/27/18, 11:58 AM

## 2018-02-27 NOTE — Progress Notes (Signed)
4 Days Post-Op  Subjective: Patient status post thoracotomy and lung resection for likely cancer.  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  Patient feels well he is sitting up with minimal pain minimal oxygen no shortness of breath  Objective: Vital signs in last 24 hours: Temp:  [97.4 F (36.3 C)-98.1 F (36.7 C)] 97.9 F (36.6 C) (05/04 0455) Pulse Rate:  [77-101] 80 (05/04 0455) Resp:  [20] 20 (05/04 0455) BP: (105-130)/(56-84) 105/63 (05/04 0455) SpO2:  [93 %-99 %] 98 % (05/04 0455) Last BM Date: 02/26/18  Intake/Output from previous day: 05/03 0701 - 05/04 0700 In: -  Out: 525 [Urine:450; Drains:15; Chest Tube:60] Intake/Output this shift: No intake/output data recorded.  Physical exam:  On 2 L of oxygen up awake alert oriented vital signs are stable chest is fairly clear on both sides.  Small air leak noted on cough with good titling  Lab Results: CBC  Recent Labs    02/25/18 0446  WBC 11.5*  HGB 13.0  HCT 39.6*  PLT 257   BMET Recent Labs    02/25/18 0446  NA 139  K 4.5  CL 103  CO2 30  GLUCOSE 116*  BUN 21*  CREATININE 1.36*  CALCIUM 9.1   PT/INR No results for input(s): LABPROT, INR in the last 72 hours. ABG No results for input(s): PHART, HCO3 in the last 72 hours.  Invalid input(s): PCO2, PO2  Studies/Results: Dg Chest 2 View  Result Date: 02/25/2018 CLINICAL DATA:  Postop.  Chest tube. EXAM: CHEST - 2 VIEW COMPARISON:  02/25/2018 FINDINGS: Stable right chest tube. There is a second drain coiled in the soft tissues of the right inferior lateral chest soft tissues. Stable tiny right lateral pneumothorax. Heterogeneous opacities in the right mid and lower lung are stable. Subsegmental atelectasis at the left base is stable. Normal heart size. IMPRESSION: Stable right chest tube.  Tiny stable pneumothorax. Stable opacities in the right lung. Electronically Signed   By: Marybelle Killings M.D.   On:  02/25/2018 16:11   Dg Chest 2 View  Result Date: 02/25/2018 CLINICAL DATA:  Status post right upper lobectomy and bronchoscopy on February 23, 2018 for an abnormal pulmonary nodule. EXAM: CHEST - 2 VIEW COMPARISON:  Portable chest x-ray of today's date and February 23, 2018 FINDINGS: There is volume loss on the right. There is a small superolateral pneumothorax which is stable. The right chest tube tip projects over the posterior aspect of the second rib. There is stable pleural thickening. A wound VAC tube projects over the right lateral costophrenic angle. The left lung is hypoinflated. The interstitial markings are mildly prominent though stable. The cardiac silhouette is enlarged. The pulmonary vascularity is normal. There is moderate gaseous distention of bowel loops in the upper abdomen. IMPRESSION: Stable small right superolateral pneumothorax with small amount of pleural fluid inferior to it. The chest tube is in stable position. Mild cardiomegaly without pulmonary vascular congestion or pulmonary edema. Electronically Signed   By: David  Martinique M.D.   On: 02/25/2018 11:03    Anti-infectives: Anti-infectives (From admission, onward)   Start     Dose/Rate Route Frequency Ordered Stop   02/23/18 1800  ceFAZolin (ANCEF) IVPB 2g/100 mL premix     2 g 200 mL/hr over 30 Minutes Intravenous Every 8 hours 02/23/18 1712 02/24/18 0218   02/23/18 1044  ceFAZolin (ANCEF) 2-4 GM/100ML-% IVPB    Note to Pharmacy:  [facility] Cornea   :  cabinet override      02/23/18 1044 02/23/18 1745   02/23/18 1043  ceFAZolin (ANCEF) 2-4 GM/100ML-% IVPB    Note to Pharmacy:  [facility]field Cornea   : cabinet override      02/23/18 1043 02/23/18 1745      Assessment/Plan: s/p Procedure(s): PREOP BRONCHOSCOPY THORACOTOMY/ WEDGE RESECTION   Status post lung resection for neuroendocrine tumor.  Very small air leak.  Will observe today.  Likely be able to remove chest tube tomorrow per Dr. Genevive Bi suggestion.  Florene Glen, MD,  FACS  02/27/2018

## 2018-02-27 NOTE — Plan of Care (Signed)
Pt worked with Pt and sat in the chair for awhile

## 2018-02-28 ENCOUNTER — Inpatient Hospital Stay: Payer: BLUE CROSS/BLUE SHIELD

## 2018-02-28 NOTE — Progress Notes (Signed)
Small pneumothorax present on chest x-ray was slightly increased.  Will replace on suction as he had a small air leak.

## 2018-02-28 NOTE — Progress Notes (Signed)
5 Days Post-Op  Subjective: Patient has no complaints today he is status post right thoracotomy and wedge resection.  Objective: Vital signs in last 24 hours: Temp:  [98 F (36.7 C)-98.1 F (36.7 C)] 98.1 F (36.7 C) (05/05 0512) Pulse Rate:  [75-88] 75 (05/05 0512) Resp:  [20] 20 (05/05 0512) BP: (106-120)/(64-89) 106/65 (05/05 0512) SpO2:  [92 %-97 %] 97 % (05/05 0512) Last BM Date: 02/27/18  Intake/Output from previous day: 05/04 0701 - 05/05 0700 In: 240 [P.O.:240] Out: 845 [Urine:750; Drains:25; Chest Tube:70] Intake/Output this shift: Total I/O In: 0  Out: 30 [Chest Tube:30]  Physical exam:  Small air leak persists.  Chest shows bilateral rhonchi but good breath sounds.  Wound is clean  Lab Results: CBC  No results for input(s): WBC, HGB, HCT, PLT in the last 72 hours. BMET No results for input(s): NA, K, CL, CO2, GLUCOSE, BUN, CREATININE, CALCIUM in the last 72 hours. PT/INR No results for input(s): LABPROT, INR in the last 72 hours. ABG No results for input(s): PHART, HCO3 in the last 72 hours.  Invalid input(s): PCO2, PO2  Studies/Results: Dg Chest 2 View  Result Date: 02/27/2018 CLINICAL DATA:  67 year old male with a history of thoracotomy 02/23/2018 with right-sided chest tube EXAM: CHEST - 2 VIEW COMPARISON:  02/25/2018, 02/23/2018, chest CT 01/12/2018 FINDINGS: Cardiomediastinal silhouette unchanged in size and contour. There has been interval placement of a new right-sided thoracostomy tube, directed laterally and terminating in the previously identified residual pneumothorax. Unchanged position of the previous right thoracostomy tube, terminating at the apex. Pleuroparenchymal thickening with opacity at the periphery of the right lung and asymmetric elevation of the right hemidiaphragm. No visualized residual pneumothorax. Left lung unchanged with coarsened interstitial markings. Surgical clips of the right lower chest. Surgical drain within the soft  tissues. No acute displaced rib fracture IMPRESSION: Interval placement of additional right-sided thoracostomy tube which terminates at the lateral aspect of the pleural space. Unchanged previous thoracostomy tube. Pleuroparenchymal thickening on the lateral right, likely a combination of treatment changes and residual pleural fluid. No visualized pneumothorax. Electronically Signed   By: Corrie Mckusick D.O.   On: 02/27/2018 09:19    Anti-infectives: Anti-infectives (From admission, onward)   Start     Dose/Rate Route Frequency Ordered Stop   02/23/18 1800  ceFAZolin (ANCEF) IVPB 2g/100 mL premix     2 g 200 mL/hr over 30 Minutes Intravenous Every 8 hours 02/23/18 1712 02/24/18 0218   02/23/18 1044  ceFAZolin (ANCEF) 2-4 GM/100ML-% IVPB    Note to Pharmacy:  Garfield Cornea   : cabinet override      02/23/18 1044 02/23/18 1745   02/23/18 1043  ceFAZolin (ANCEF) 2-4 GM/100ML-% IVPB    Note to Pharmacy:  Garfield Cornea   : cabinet override      02/23/18 1043 02/23/18 1745      Assessment/Plan: s/p Procedure(s): PREOP BRONCHOSCOPY THORACOTOMY/ WEDGE RESECTION   Chest x-ray pending this morning.  Small air leak persists.  I would be hesitant to remove his chest tube at this time.  Florene Glen, MD, FACS  02/28/2018

## 2018-03-01 ENCOUNTER — Inpatient Hospital Stay: Payer: BLUE CROSS/BLUE SHIELD

## 2018-03-01 DIAGNOSIS — E440 Moderate protein-calorie malnutrition: Secondary | ICD-10-CM

## 2018-03-01 LAB — CBC
HCT: 37.5 % — ABNORMAL LOW (ref 40.0–52.0)
HEMOGLOBIN: 12.7 g/dL — AB (ref 13.0–18.0)
MCH: 32 pg (ref 26.0–34.0)
MCHC: 34 g/dL (ref 32.0–36.0)
MCV: 94.3 fL (ref 80.0–100.0)
Platelets: 322 10*3/uL (ref 150–440)
RBC: 3.97 MIL/uL — AB (ref 4.40–5.90)
RDW: 13.6 % (ref 11.5–14.5)
WBC: 10.1 10*3/uL (ref 3.8–10.6)

## 2018-03-01 LAB — CREATININE, SERUM
CREATININE: 1.23 mg/dL (ref 0.61–1.24)
GFR calc Af Amer: >60 mL/min (ref >60)
GFR, EST NON AFRICAN AMERICAN: 59 mL/min — AB (ref >60)

## 2018-03-01 MED ORDER — ALUM & MAG HYDROXIDE-SIMETH 200-200-20 MG/5ML PO SUSP
30.0000 mL | ORAL | Status: DC | PRN
  Administered 2018-03-01 – 2018-03-03 (×2): 30 mL via ORAL
  Filled 2018-03-01 (×2): qty 30

## 2018-03-01 MED ORDER — ENOXAPARIN SODIUM 40 MG/0.4ML ~~LOC~~ SOLN
40.0000 mg | SUBCUTANEOUS | Status: DC
  Administered 2018-03-01 – 2018-03-04 (×4): 40 mg via SUBCUTANEOUS
  Filled 2018-03-01 (×4): qty 0.4

## 2018-03-01 MED ORDER — ENSURE ENLIVE PO LIQD
237.0000 mL | Freq: Three times a day (TID) | ORAL | Status: DC
  Administered 2018-03-01 – 2018-03-11 (×23): 237 mL via ORAL

## 2018-03-01 NOTE — Care Management Important Message (Signed)
Copy of signed IM left in patient's room.    

## 2018-03-01 NOTE — Progress Notes (Signed)
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  He has an airleak on the right side and chest tube was not removed yesterday.  Today, CXR to me looks like PTX is a bit larger in size but patient's RN reports that the tube had a kink on it this morning and a lot of air bubbles came out after kink was removed.    Patient reports feeling well without much pain and is breathing well.  Still on nasal canula  Vital signs: Temp:  [98.2 F (36.8 C)-98.3 F (36.8 C)] 98.2 F (36.8 C) (05/06 0426) Pulse Rate:  [69-90] 69 (05/06 0426) Resp:  [18-20] 18 (05/06 0426) BP: (122-138)/(66-78) 138/66 (05/06 0426) SpO2:  [95 %] 95 % (05/06 0426)   Intake/Output: 05/05 0701 - 05/06 0700 In: 240 [P.O.:240] Out: 1525 [Urine:1350; Drains:25; Chest Tube:150] Last BM Date: 02/27/18  Physical Exam: Constitutional: No acute distress Cardiac:  Regular rhythm and rate Pulm:  Right thoracotomy incision healing well with staples in place.  Right chest tube to suction with air leak on system, connections well sealed.  Right JP drain with serosanguinous fluid. Abdomen:  Soft, nondistended.  Imaging: No results found.  Assessment/Plan: 67 yo male s/p R thoracotomy and RUL wedge resection.  --Patient with audible air leak via right chest tube.  Due to possible kink in tubing earlier, will repeat CXR this morning to re-eval now that the kink has been corrected.  Want to see if his PTX improves or not.  If not, he may need another chest tube. --wean [place] as tolerated --OOB, ambulate --DVT prophylaxis   Melvyn Neth, [place]

## 2018-03-01 NOTE — Progress Notes (Addendum)
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  GOAL:   Patient will meet greater than or equal to 90% of their needs  MONITOR:   PO intake, Supplement acceptance, Labs, Weight trends, Skin, I & O's  REASON FOR ASSESSMENT:   LOS    ASSESSMENT:   67 y/o male with h/o COPD, admitted with new lung mass s/p right muscle-sparing thoracotomy with wedge resection of right upper lobe mass; pleural biopsy 4/30 Pt s/p colonoscopy with multiple polyps removed 4/29  Chest tube with 145m output x 24 hrs  Met with pt in room today. Pt reports poor appetite and oral intake since admit. Pt ate some eggs for breakfast this morning. Pt reports that he has been drinking some Ensure. Pt reports a recent 20lb weight gain since he quit smoking; pt reports that his UBW is around 145lbs but her had recently gained to 159lbs. RD dicussed with pt today about the importance of adequate protein and energy intake needed to recover from recent surgery and prevent loss of lean muscle. Pt is willing to drink Ensure and Magic Cups. Would also recommend MVI as pt is not eating well enough to meet his micronutrient needs. Pt likely at moderate refeeding risk; would recommend check K, Mg, and P labs once pt's oral intake improves.   Medications reviewed and include: dulcolax, lovenox, protonix, maalox  Labs reviewed: K 4.5 wnl, P 2.9 wnl, Mg 1.8 wnl- 5/2   NUTRITION - FOCUSED PHYSICAL EXAM:    Most Recent Value  Orbital Region  Mild depletion  Upper Arm Region  Moderate depletion  Thoracic  and Lumbar Region  Mild depletion  Buccal Region  Mild depletion  Clavicle Bone Region  Moderate depletion  Clavicle and Acromion Bone Region  Moderate depletion  Scapular Bone Region  Moderate depletion  Dorsal Hand  Moderate depletion  Patellar Region  Moderate depletion  Anterior Thigh Region  Moderate depletion  Posterior Calf Region  Moderate depletion  Edema (RD Assessment)  None  Hair  Reviewed  Eyes  Reviewed  Mouth  Reviewed  Skin  Reviewed  Nails  Reviewed     Diet Order:   Diet Order           Diet regular Room service appropriate? Yes; Fluid consistency: Thin  Diet effective now         EDUCATION NEEDS:   Education needs have been addressed  Skin:  Skin Assessment: (incision chest)  Last BM:  5/4- type 6  Height:   Ht Readings from Last 1 Encounters:  02/23/18 5' 9"  (1.753 m)    Weight:   Wt Readings from Last 1 Encounters:  02/23/18 158 lb 8.2 oz (71.9 kg)    Ideal Body Weight:  72.7 kg  BMI:  Body mass index is 23.41 kg/m.  Estimated Nutritional Needs:   Kcal:  1900-2200kcal/day   Protein:  93-108g/day   Fluid:  >1.9L/day   CKoleen DistanceMS, RD, LDN Pager #[phone] Hours Pager: [phone]

## 2018-03-02 ENCOUNTER — Inpatient Hospital Stay: Payer: BLUE CROSS/BLUE SHIELD

## 2018-03-02 ENCOUNTER — Telehealth: Payer: Self-pay | Admitting: Cardiothoracic Surgery

## 2018-03-02 NOTE — Progress Notes (Signed)
Physical Therapy Treatment Patient Details Name: Joseph Barron MRN: [id number] DOB: February 18, 1951 Today's Date: 03/02/2018    History of Present Illness Pt is a 67 year old white male with an enlarging right upper lobe mass. 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 He was extensively evaluated and found to be a suitable candidate for surgery.  Pt is s/p R thoracotomy with wedge resection of right upper lobe mass.  PMH includes: borderline DM, cancer, GERD, HLD, HTN, and pulmonary nodules.    PT Comments    Pt ambulated x 2 around nursing unit this am with walker and supervision./mod I.  No lob noted and generally safe gait.  Pt on water seal for gait.    Follow Up Recommendations  Home health PT;Other (comment)  Pt may benefit from OP Lung works program vs HHPT if appropriate.     Equipment Recommendations  Rolling walker with 5" wheels    Recommendations for Other Services       Precautions / Restrictions Precautions Precautions: Fall Precaution Comments: Multiple incisions/drains on the R side of the chest; may place chest tube to water seal during ambulation   Restrictions Weight Bearing Restrictions: No    Mobility  Bed Mobility Overal bed mobility: Modified Independent                Transfers Overall transfer level: Modified independent   Transfers: Sit to/from Stand Sit to Stand: Modified independent (Device/Increase time)         General transfer comment: Pt steady with good control during transfers  Ambulation/Gait Ambulation/Gait assistance: Supervision;Modified independent (Device/Increase time) Ambulation Distance (Feet): 300 Feet Assistive device: Rolling walker (2 wheeled) Gait Pattern/deviations: Step-through pattern   Gait velocity interpretation: 1.31 - 2.62 ft/sec, indicative of limited community Metall[employer] Rankin (Stroke Patients Only)       Balance Overall balance  assessment: No apparent balance deficits (not formally assessed)                                          Cognition Arousal/Alertness: Awake/alert Behavior During Therapy: WFL for tasks assessed/performed Overall Cognitive Status: Within Functional Limits for tasks assessed                                        Exercises      General Comments        Pertinent Vitals/Pain Pain Assessment: No/denies pain    Home Living                      Prior Function            PT Goals (current goals can now be found in the care plan section) Progress towards PT goals: Progressing toward goals    Frequency    Min 2X/week      PT Plan Current plan remains appropriate    Co-evaluation              AM-PAC PT "6 Clicks" Daily Activity  Outcome Measure  Difficulty turning over in bed (including adjusting bedclothes, sheets and blankets)?: None Difficulty moving from lying on back to sitting on the side of the bed? : None Difficulty sitting  down on and standing up from a chair with arms (e.g., wheelchair, bedside commode, etc,.)?: None Help needed moving to and from a bed to chair (including a wheelchair)?: None Help needed walking in hospital room?: None Help needed climbing 3-5 steps with a railing? : A Little 6 Click Score: 23    End of Session Equipment Utilized During Treatment: Oxygen Activity Tolerance: Patient tolerated treatment well Patient left: in chair;with call bell/phone within reach Nurse Communication: Other (comment)       Time: 2767-0110 PT Time Calculation (min) (ACUTE ONLY): 12 min  Charges:  $Gait Training: 8-22 mins                    G Codes:       Chesley Noon, PTA 03/02/18, 10:22 AM

## 2018-03-02 NOTE — Progress Notes (Signed)
03/02/2018  Subjective: Patient is 7 Days Post-Op s/p right thoracotomy and RUL wedge resection.  Patient had air-leak yesterday with worse PTX on CXR but chest tube noted to be kinked and after repositioning, follow up CXR improved.  He reports feeling well today without significant pain.    Vital signs: Temp:  [97.6 F (36.4 C)-98 F (36.7 C)] 97.6 F (36.4 C) (05/07 0431) Pulse Rate:  [74-88] 74 (05/07 0431) Resp:  [20] 20 (05/07 0431) BP: (107-111)/(59-60) 107/59 (05/07 0431) SpO2:  [93 %-96 %] 93 % (05/07 1017)   Intake/Output: 05/06 0701 - 05/07 0700 In: 600 [P.O.:600] Out: 700 [Urine:650; Drains:20; Chest Tube:30] Last BM Date: 02/27/18  Physical Exam: Constitutional: No acute distress Cardiac:  Regular rhythm and rate Pulm: Right chest tube with improved air leak, now more with forcerful breathing and coughing.  R thoracotomy incision with staples clean, dry, intact. Abdomen:  Soft, nondistended, nontender  Labs:  Recent Labs    03/01/18 1028  WBC 10.1  HGB 12.7*  HCT 37.5*  PLT 322   Recent Labs    03/01/18 1028  CREATININE 1.23   No results for input(s): LABPROT, INR in the last 72 hours.  Imaging: Dg Chest Port 1 View  Result Date: 03/02/2018 CLINICAL DATA:  History of right-sided pneumothorax EXAM: PORTABLE CHEST 1 VIEW COMPARISON:  Portable chest x-ray of 03/01/2017 and 02/28/2017 FINDINGS: Only a tiny right apical pneumothorax remains with right chest tube present. There is little change in probable fluid at the right lung base possibly loculated with mild bibasilar atelectasis as well mild atelectasis is the lump at the left lung base remains and heart size is stable. A drain is located over the periphery of the right upper quadrant. IMPRESSION: 1. No change in tiny right apical pneumothorax with right chest tube remaining. 2. No change in probable loculated fluid at the right lung base laterally with bibasilar atelectasis. Electronically Signed   By: Ivar Drape M.D.   On: 03/02/2018 09:40    Assessment/Plan: 67 yo male s/p right thoracotomy and RUL wedge resection  --continue chest tube to suction.  Improved air leak and CXR. --will repeat CXR tomorrow and possibly change to waterseal depending on airleak. --continue diet, oob, ambulate, and dvt prophylaxis   Melvyn Neth, [place]

## 2018-03-02 NOTE — Telephone Encounter (Signed)
Disability paperwork faxed today per [facility].

## 2018-03-02 NOTE — Telephone Encounter (Signed)
Patient's daughter called to update that disability paperwork had not been received. Please fax directly to [phone] Attention: Nevin Bloodgood. Please call with any questions.

## 2018-03-03 ENCOUNTER — Inpatient Hospital Stay: Payer: BLUE CROSS/BLUE SHIELD

## 2018-03-03 NOTE — Progress Notes (Signed)
8 Days Post-Op  Subjective: Checks x-ray currently pending.  Patient continues with large air leak.  He has no complaints at this time tolerating a diet  Objective: Vital signs in last 24 hours: Temp:  [97.7 F (36.5 C)-98.2 F (36.8 C)] 97.7 F (36.5 C) (05/08 0356) Pulse Rate:  [66-78] 66 (05/08 0356) Resp:  [16-20] 20 (05/08 0356) BP: (108-136)/(53-68) 119/64 (05/08 0356) SpO2:  [93 %-99 %] 96 % (05/08 0356) Last BM Date: 03/02/18  Intake/Output from previous day: 05/07 0701 - 05/08 0700 In: 1560 [P.O.:1560] Out: 1104 [Urine:1050; Chest Tube:54] Intake/Output this shift: Total I/O In: -  Out: 30 [Chest Tube:30]  Physical exam:  Obvious large air leak on suction.  Chest clear to auscultation all connections are inspected and no sign of tube leak  Lab Results: CBC  Recent Labs    03/01/18 1028  WBC 10.1  HGB 12.7*  HCT 37.5*  PLT 322   BMET Recent Labs    03/01/18 1028  CREATININE 1.23   PT/INR No results for input(s): LABPROT, INR in the last 72 hours. ABG No results for input(s): PHART, HCO3 in the last 72 hours.  Invalid input(s): PCO2, PO2  Studies/Results: Dg Chest Port 1 View  Result Date: 03/02/2018 CLINICAL DATA:  History of right-sided pneumothorax EXAM: PORTABLE CHEST 1 VIEW COMPARISON:  Portable chest x-ray of 03/01/2017 and 02/28/2017 FINDINGS: Only a tiny right apical pneumothorax remains with right chest tube present. There is little change in probable fluid at the right lung base possibly loculated with mild bibasilar atelectasis as well mild atelectasis is the lump at the left lung base remains and heart size is stable. A drain is located over the periphery of the right upper quadrant. IMPRESSION: 1. No change in tiny right apical pneumothorax with right chest tube remaining. 2. No change in probable loculated fluid at the right lung base laterally with bibasilar atelectasis. Electronically Signed   By: Ivar Drape M.D.   On: 03/02/2018 09:40    Dg Chest Port 1 View  Result Date: 03/01/2018 CLINICAL DATA:  Follow-up right-sided pneumothorax demonstrated on chest x-ray of earlier today at 7:48 a.m. Patient was found have a kinked chest tube. Follow-up study. EXAM: PORTABLE CHEST 1 VIEW COMPARISON:  Chest x-ray of earlier today at 7:48 a.m. FINDINGS: The pneumothorax has decreased slightly in volume. This is accentuated by the end expiratory nature of this chest x-ray. The right chest tube is stable overlying the posterior aspect of the second rib. There is persistent density peripherally in the right mid lung and at the right lung base. There is no significant mediastinal shift. The left lung is grossly clear. The heart and pulmonary vascularity are normal. IMPRESSION: Slight decrease in volume of the right-sided pneumothorax such that it likely occupies approximately 20% of the lung volume. Electronically Signed   By: David  Martinique M.D.   On: 03/01/2018 10:43    Anti-infectives: Anti-infectives (From admission, onward)   Start     Dose/Rate Route Frequency Ordered Stop   02/23/18 1800  ceFAZolin (ANCEF) IVPB 2g/100 mL premix     2 g 200 mL/hr over 30 Minutes Intravenous Every 8 hours 02/23/18 1712 02/24/18 0218   02/23/18 1044  ceFAZolin (ANCEF) 2-4 GM/100ML-% IVPB    Note to Pharmacy:  Garfield Cornea   : cabinet override      02/23/18 1044 02/23/18 1745   02/23/18 1043  ceFAZolin (ANCEF) 2-4 GM/100ML-% IVPB    Note to Pharmacy:  Garfield Cornea   :  cabinet override      02/23/18 1043 02/23/18 1745      Assessment/Plan: s/p Procedure(s): PREOP BRONCHOSCOPY THORACOTOMY/ WEDGE RESECTION   Patient with fairly large volume air leak considering how remote his actual surgery was and that it was closed at one point.  Chest x-ray is currently pending and will review.  It is too early to consider waterseal at this time.  Florene Glen, MD, FACS  03/03/2018

## 2018-03-03 NOTE — Care Management Important Message (Signed)
Copy of signed IM left in patient's room.    

## 2018-03-04 ENCOUNTER — Inpatient Hospital Stay: Payer: BLUE CROSS/BLUE SHIELD

## 2018-03-04 NOTE — Care Management (Signed)
Patient still with chest tube in place to suction.  If all goes as planned will be changed to water seal tomorrow. At discharge patient will require home health RW.  Heads up referral has already been given to [facility].  Patient still requiring acute O2.  Will need to be qualified again prior to discharge.

## 2018-03-04 NOTE — Progress Notes (Signed)
03/04/2018  Subjective: Patient is 9 Days Post-Op s/p right thoracotomy and RUL wedge resection.  Patient with persistent air leak, though on CXR yesterday, there was no further pneumothorax on the read.  Otherwise denies any complaints and has been tolerating a diet and pain is controlled.  Vital signs: Temp:  [97.6 F (36.4 C)-98.2 F (36.8 C)] 97.7 F (36.5 C) (05/09 0412) Pulse Rate:  [69-79] 71 (05/09 0412) Resp:  [16] 16 (05/09 0412) BP: (110-122)/(59-76) 113/74 (05/09 0412) SpO2:  [97 %-99 %] 97 % (05/09 0412)   Intake/Output: 05/08 0701 - 05/09 0700 In: 480 [P.O.:480] Out: 830 [Urine:725; Drains:25; Chest Tube:80] Last BM Date: 03/02/18  Physical Exam: Constitutional: No acute distress Pulm:  Right chest tube in place to suction, with small airleak with deep inspiration.  JP with serosanguinous fluid.  Labs:  No results for input(s): WBC, HGB, HCT, PLT in the last 72 hours. No results for input(s): NA, K, CL, CO2, GLUCOSE, BUN, CREATININE, CALCIUM in the last 72 hours.  Invalid input(s): MAGNESIUM No results for input(s): LABPROT, INR in the last 72 hours.  Imaging: No results found.  Assessment/Plan: 67 yo male s/p right thoracotomy and RUL wedge resection.  --will decrease the suction to -10 cm H2O today and repeat CXR.  If lung continues to stay up would keep the suction there and try waterseal tomorrow. --discussed with Dr. Genevive Bi that if he continues with an airleak but his lung remains expanded on waterseal, could change canister to Heimlich valve and possibly discharge home. All depends on how his lung remains expanded or not.   Melvyn Neth, [place]

## 2018-03-04 NOTE — Telephone Encounter (Signed)
Paperwork has been faxed in.

## 2018-03-05 ENCOUNTER — Inpatient Hospital Stay: Payer: BLUE CROSS/BLUE SHIELD

## 2018-03-05 MED ORDER — MIDAZOLAM HCL 5 MG/5ML IJ SOLN
INTRAMUSCULAR | Status: AC | PRN
  Administered 2018-03-05: 1 mg via INTRAVENOUS
  Administered 2018-03-05 (×2): 0.5 mg via INTRAVENOUS
  Administered 2018-03-05: 1 mg via INTRAVENOUS

## 2018-03-05 MED ORDER — SODIUM CHLORIDE 0.9 % IV SOLN
INTRAVENOUS | Status: DC
  Administered 2018-03-05: 10:00:00 via INTRAVENOUS

## 2018-03-05 MED ORDER — ENOXAPARIN SODIUM 40 MG/0.4ML ~~LOC~~ SOLN
40.0000 mg | SUBCUTANEOUS | Status: DC
  Administered 2018-03-05 – 2018-03-10 (×6): 40 mg via SUBCUTANEOUS
  Filled 2018-03-05 (×6): qty 0.4

## 2018-03-05 MED ORDER — FENTANYL CITRATE (PF) 100 MCG/2ML IJ SOLN
INTRAMUSCULAR | Status: AC | PRN
  Administered 2018-03-05: 50 ug via INTRAVENOUS
  Administered 2018-03-05: 25 ug via INTRAVENOUS

## 2018-03-05 MED ORDER — MIDAZOLAM HCL 5 MG/5ML IJ SOLN
INTRAMUSCULAR | Status: AC
  Filled 2018-03-05: qty 5

## 2018-03-05 MED ORDER — FENTANYL CITRATE (PF) 100 MCG/2ML IJ SOLN
INTRAMUSCULAR | Status: AC
  Filled 2018-03-05: qty 4

## 2018-03-05 MED ORDER — LIDOCAINE HCL (PF) 1 % IJ SOLN
INTRAMUSCULAR | Status: AC | PRN
  Administered 2018-03-05: 10 mL

## 2018-03-05 NOTE — Care Management Important Message (Signed)
Copy of signed IM left in patient's room.    

## 2018-03-05 NOTE — Progress Notes (Signed)
Interventional Radiology Note  Discussed case with Dr. Hampton Abbot and reviewed CT from yesterday.  Given persistent air leak after RUL wedge resection and persistent component of right basilar PTX, would likely benefit from additional catheter placement to help try to re-expand rest of right lung.  Will proceed with CT guided placement of right chest tube today.  Consent obtained from patient.  Venetia Night. Kathlene Cote, M.D Pager:  [phone]

## 2018-03-05 NOTE — Procedures (Signed)
Interventional Radiology Procedure Note  Procedure: CT guided chest tube placement  Complications: None  Estimated Blood Loss: None  Findings: 12 Fr pigtail chest tube placed anteriorly on right at level of residual small basilar pneumothorax.  Connected to pleur-evac device.  Venetia Night. Kathlene Cote, M.D Pager:  [phone]

## 2018-03-05 NOTE — Progress Notes (Signed)
03/05/2018  Subjective: Patient is 10 Days Post-Op s/p right thoracotomy and RUL wedge resection.  Continues having an air leak on chest tube.  CT scan obtained yesterday shows a small pneumothorax on the inferior portion of the right chest that is not being evacuated by the surgical chest tube.  Vital signs: Temp:  [97.7 F (36.5 C)-98.3 F (36.8 C)] 97.7 F (36.5 C) (05/10 0441) Pulse Rate:  [69-86] 69 (05/10 0441) Resp:  [16-18] 16 (05/10 0441) BP: (112-124)/(59-64) 112/61 (05/10 0441) SpO2:  [95 %-97 %] 97 % (05/10 0441)   Intake/Output: 05/09 0701 - 05/10 0700 In: 240 [P.O.:240] Out: 1290 [Urine:1225; Drains:5; Chest Tube:60] Last BM Date: 03/02/18  Physical Exam: Constitutional:  No acute distress Pulm:  Right chest tube in place with serosanguinous fluid in pleura-vac.  Positive air leak on expiration.  JP drain in place with low volume serosanguinous fluid.  Right thoracotomy incision healing well with staples in place.   Labs:  No results for input(s): WBC, HGB, HCT, PLT in the last 72 hours. No results for input(s): NA, K, CL, CO2, GLUCOSE, BUN, CREATININE, CALCIUM in the last 72 hours.  Invalid input(s): MAGNESIUM No results for input(s): LABPROT, INR in the last 72 hours.  Imaging: Ct Chest Wo Contrast  Result Date: 03/04/2018 CLINICAL DATA:  Persistent air leak status post right thoracotomy and right upper lobe wedge resection 9 days ago EXAM: CT CHEST WITHOUT CONTRAST TECHNIQUE: Multidetector CT imaging of the chest was performed following the standard protocol without IV contrast. COMPARISON:  CT scan of January 12, 2018. FINDINGS: Cardiovascular: Atherosclerosis of thoracic aorta is noted without aneurysm formation. Normal cardiac size. No pericardial effusion is noted. Mediastinum/Nodes: No enlarged mediastinal or axillary lymph nodes. Thyroid gland, trachea, and esophagus demonstrate no significant findings. Lungs/Pleura: Interval placement of right-sided chest tube  with mild pneumothorax is noted apically and and basilar region. Emphysematous disease is noted in the upper lobes bilaterally. Right lower lobe atelectasis is noted. Postsurgical changes are noted laterally in the right upper lobe. No significant pleural effusion is noted. Upper Abdomen: No acute abnormality. Musculoskeletal: Mildly displaced right sixth rib fracture is noted most likely postsurgical in etiology. IMPRESSION: Mild right apical and basilar pneumothorax is noted. Interval placement of right-sided chest tube with tip in right lung apex. Postsurgical changes are noted involving the lateral portion of right upper lobe. Right basilar subsegmental atelectasis is noted. Aortic Atherosclerosis (ICD10-I70.0) and Emphysema (ICD10-J43.9). Electronically Signed   By: Marijo Conception, M.D.   On: 03/04/2018 15:08   Dg Chest Port 1 View  Result Date: 03/04/2018 CLINICAL DATA:  Right lobectomy on 02/23/2018. Continued air leak on suction. EXAM: PORTABLE CHEST 1 VIEW COMPARISON:  03/03/2018 FINDINGS: Cardiomediastinal silhouette is normal. Mediastinal contours appear intact. Right chest tube in stable position. There is air lucency along the right hemidiaphragm. Small right pleural effusion. Persistent small right pneumothorax. Bibasilar atelectasis. Osseous structures are without acute abnormality. Soft tissues are grossly normal. IMPRESSION: Right-sided chest tube in stable position. Persistent right hydropneumothorax. Crescent-shaped air lucency along the right hemidiaphragm, new from the prior radiograph. This may represent subpleural pneumothorax or potentially pneumoperitoneum. Confirmation with decubital radiograph, left-side-down. May be considered. Bilateral atelectasis. Electronically Signed   By: Fidela Salisbury M.D.   On: 03/04/2018 12:17    Assessment/Plan: 66 yo male s/p right thoracotomy and RUL wedge resection.  --discussed with IR about placing new inferior chest tube with image  guidance.  This would help to re-expand the lung better. --  discussed with Dr. Genevive Bi about chest tube management.  If lung can be expanded, despite of air leak, may be able to eventually transition to a heimlich valve for outpatient management.  JP drain will be removed prior to discharge. --NPO for procedure today and lovenox held.  Will resume diet and lovenox afterwards.   Melvyn Neth, [place]

## 2018-03-06 ENCOUNTER — Inpatient Hospital Stay: Payer: BLUE CROSS/BLUE SHIELD

## 2018-03-06 NOTE — Progress Notes (Signed)
03/06/2018  Subjective: No acute events.  Patient had 2nd chest tube placed yesterday via IR, along the inferior portion of the right chest, where there was a basilar pneumothorax not being evacuated by the surgical chest tube.  On today's CXR, that area looks better and the lateral component is improved as well.  Patient denies any shortness of breath and his pain is controlled.  Vital signs: Temp:  [97.7 F (36.5 C)-98.4 F (36.9 C)] 97.7 F (36.5 C) (05/11 0459) Pulse Rate:  [63-88] 63 (05/11 0459) Resp:  [13-20] 16 (05/11 0459) BP: (90-136)/(53-73) 95/55 (05/11 0459) SpO2:  [93 %-97 %] 95 % (05/11 6712)   Intake/Output: 05/10 0701 - 05/11 0700 In: 33 [I.V.:33] Out: 565 [Urine:525; Chest Tube:40] Last BM Date: 03/05/18  Physical Exam: Constitutional: No acute distress Pulm:  Two chest tubes in place.  Surgical chest tube with persistent air leak.  IR chest tube without airleak.  Both to suction.  JP drain with serosanguinous fluid.  Labs:  No results for input(s): WBC, HGB, HCT, PLT in the last 72 hours. No results for input(s): NA, K, CL, CO2, GLUCOSE, BUN, CREATININE, CALCIUM in the last 72 hours.  Invalid input(s): MAGNESIUM No results for input(s): LABPROT, INR in the last 72 hours.  Imaging: Dg Chest Port 1 View  Result Date: 03/06/2018 CLINICAL DATA:  Right-sided pneumothorax.  Follow-up. EXAM: PORTABLE CHEST 1 VIEW COMPARISON:  Mar 04, 2018 FINDINGS: Again noted is a right chest tube terminating near the right apex. There is a new pigtail catheter projected over the right hemidiaphragm. On this single study, the location of this catheter is unclear. The known right sided pneumothorax is stable. Opacity in the lateral right lung is stable. The cardiomediastinal silhouette and left lung are stable. IMPRESSION: 1. Stable right chest tube. 2. New pigtail catheter project over the right hemidiaphragm in uncertain location on this single view. 3. The right-sided pneumothorax is a  little smaller in the interval. 4. No other changes. Electronically Signed   By: Dorise Bullion III M.D   On: 03/06/2018 07:14   Ct Image Guided Drainage By Percutaneous Catheter  Result Date: 03/05/2018 CLINICAL DATA:  Status post wedge resection of right upper lobe lung adenocarcinoma with persistent air leak postoperatively and residual basilar pneumothorax. Request has been made to place a image guided chest tube to treat the residual pneumothorax. EXAM: CT GUIDED RIGHT CHEST THORACOSTOMY TUBE PLACEMENT ANESTHESIA/SEDATION: 2.0 mg IV Versed 75 mcg IV Fentanyl Total Moderate Sedation Time:  25 minutes The patient's level of consciousness and physiologic status were continuously monitored during the procedure by Radiology nursing. PROCEDURE: The procedure, risks, benefits, and alternatives were explained to the patient. Questions regarding the procedure were encouraged and answered. The patient understands and consents to the procedure. A time out was performed prior to initiating the procedure. CT was performed through the mid to lower chest in a supine position. The right anterior chest wall was prepped with chlorhexidine in a sterile fashion, and a sterile drape was applied covering the operative field. A sterile gown and sterile gloves were used for the procedure. Local anesthesia was provided with 1% Lidocaine. Under CT guidance, an 18 gauge trocar needle was advanced into the anterior right pleural space. After confirming needle tip position, a guidewire was advanced into the pleural space. The tract was dilated over the wire and a 12 French pigtail drainage catheter advanced over the wire. Catheter position was confirmed by CT. The catheter was connected to a Pleur-evac  device. It was secured at the skin with a Prolene retention suture, StatLock device and Vaseline gauze dressing. COMPLICATIONS: None FINDINGS: Initial imaging demonstrates residual anterior and basilar right pneumothorax. The chest tube  was able to be advanced into the anterior and basilar pleural space with complete evacuation of air. IMPRESSION: CT-guided right chest thoracostomy tube placement. A 12 French catheter was advanced into the residual right anterior basilar pneumothorax cavity. The catheter was connected to a Pleur-evac device which will be connected to wall suction at -20 cm of water. Electronically Signed   By: Aletta Edouard M.D.   On: 03/05/2018 13:25    Assessment/Plan: 67 yo male s/p right thoracotomy and RUL wedge resection, with persistent air leak.  --will continue both chest tubes to suction -20 cm H2O for another 24 hours before trying to do any waterseal.  However, the patient still has a persistent air leak on the original chest tube.  Ideally would see if the lung can fully expand and remain expanded on waterseal, but we're still a ways from that.  Patient understands that we need to get this better before considering any changes to his chest tubes.  Dr. Genevive Bi will return on 5/13. --JP drain should be fine to remove prior to discharge and currently had minimal output and may remove later today or tomorrow. --wean O2 off today.  Currently only on 1 L [place]. --may d/c telemetry as patient has been stable throughout.   Joseph Barron, [place]

## 2018-03-07 ENCOUNTER — Inpatient Hospital Stay: Payer: BLUE CROSS/BLUE SHIELD

## 2018-03-07 NOTE — Progress Notes (Signed)
SURGICAL PROGRESS NOTE (cpt [phone])  Hospital Day(s): 12.   Post op day(s): 12 Days Post-Op.   Interval History: Patient seen and examined, no acute events or new complaints overnight. Patient reports minimal pain has been well-controlled, and he denies any fever/chills, CP, or SOB, and he says he has been eating/drinking without N/V with +flatus, +BM, and ambulating around RN's station without difficulty.  Review of Systems:  Constitutional: denies fever, chills  HEENT: denies cough or congestion  Respiratory: shortness of breath as per interval history Cardiovascular: chest pain as per interval history, denies palpitations Gastrointestinal: denies abdominal pain, N/V, or diarrhea Genitourinary: denies burning with urination or urinary frequency Musculoskeletal: denies pain, decreased motor or sensation Integumentary: denies any other rashes or skin discolorations except thoracostomy tube sites Neurological: denies HA or vision/hearing changes   Vital signs in last 24 hours: [min-max] current  Temp:  [97.8 F (36.6 C)-98.4 F (36.9 C)] 97.8 F (36.6 C) (05/12 0515) Pulse Rate:  [65-77] 69 (05/12 0515) Resp:  [18] 18 (05/12 0515) BP: (103-124)/(56-70) 119/70 (05/12 0515) SpO2:  [94 %-98 %] 98 % (05/12 0755)     Height: 5\' 9"  (175.3 cm) Weight: 158 lb 8.2 oz (71.9 kg) BMI (Calculated): 23.4   Intake/Output this shift:  Total I/O In: -  Out: 275 [Urine:275]   Intake/Output last 2 shifts:  @IOLAST2SHIFTS @   Physical Exam:  Constitutional: alert, cooperative and no distress  HENT: normocephalic without obvious abnormality  Eyes: PERRL, EOM's grossly intact and symmetric  Neuro: CN II - XII grossly intact and symmetric without deficit  Respiratory: breathing non-labored at rest, Right chest thoracostomy drains well-secured with serosanguinous fluid in tubing and drains with a large air leak to surgical Pleuravac canister with cough and valsalva, no airleak to IR chest  tube/Pleuravac, incisions well-approximated without surrounding erythema or drainage, JP with serosanguinous fluid Cardiovascular: regular rate and sinus rhythm  Gastrointestinal: soft, non-tender, and non-distended Musculoskeletal: UE and LE FROM, no edema or wounds, motor and sensation grossly intact, NT   Labs:  CBC Latest Ref Rng & Units 03/01/2018 02/25/2018 02/23/2018  WBC 3.8 - 10.6 K/uL 10.1 11.5(H) 14.8(H)  Hemoglobin 13.0 - 18.0 g/dL 12.7(L) 13.0 13.4  Hematocrit 40.0 - 52.0 % 37.5(L) 39.6(L) 39.4(L)  Platelets 150 - 440 K/uL 322 257 288   CMP Latest Ref Rng & Units 03/01/2018 02/25/2018 02/23/2018  Glucose 65 - 99 mg/dL - 116(H) 171(H)  BUN 6 - 20 mg/dL - 21(H) 19  Creatinine 0.61 - 1.24 mg/dL 1.23 1.36(H) 1.31(H)  Sodium 135 - 145 mmol/L - 139 136  Potassium 3.5 - 5.1 mmol/L - 4.5 4.1  Chloride 101 - 111 mmol/L - 103 103  CO2 22 - 32 mmol/L - 30 24  Calcium 8.9 - 10.3 mg/dL - 9.1 9.0  Total Protein 6.5 - 8.1 g/dL - - -  Total Bilirubin 0.3 - 1.2 mg/dL - - -  Alkaline Phos 38 - 126 U/L - - -  AST 15 - 41 U/L - - -  ALT 17 - 63 U/L - - -   Imaging studies:  Chest X-ray (03/07/2018) - personally reviewed, compared to prior studies, and discussed with patient The heart size and mediastinal contours are stable. Two right-sided chest tubes are identified unchanged. Tiny lateral right pneumothorax is unchanged. Patchy consolidation of left lung base is noted probably due to atelectasis. Minimal left pleural effusion is noted. The visualized skeletal structures are unremarkable.  Pathology (02/23/2018) A. LUNG, RIGHT UPPER LOBE; WEDGE RESECTION:  -  LARGE CELL NEUROENDOCRINE CARCINOMA.  - REACTIVE SUBPLEURAL SMOOTH MUSCLE HYPERPLASIA.   Tumor Size: 1.4 cm  Tumor Focality: Single tumor  Margins: All margins are uninvolved by tumor  Distance of invasive carcinoma from closest margin: 0.7 cm  Pathologic Stage Classification (pTNM, AJCC 8th Edition): pT1b pNx  B. 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  - NEGATIVE FOR MALIGNANCY.  Assessment/Plan: 67 y.o. male with improved Right basilar pneumothorax and air leak 12 Days Post-Op s/p Right thoracotomy with wedge resection of right upper lobe mass and pleural biopsy (to evaluate for mesothelioma) for Right upper lobe non-small cell carcinoma and 2 Days s/p placement of Right basilar pleural drain/thoracostomy tube for unresolved Right basilar pneumothorax, complicated by pertinent comorbidities including borderline DM, HTN, HLD, GERD, and former tobacco abuse (smoking).   - pain control prn (minimize narcotics)  - continue chest tubes to suction for today considering <48 hours since second pleural drain placed  - anticipate chest tubes to waterseal tomorrow and possible removal of one or both pending resolution of air leak  - check am CXR, medical management of comorbidities (home medications)  - DVT prophylaxis, ambulation encouraged  All of the above findings and recommendations were discussed with the patient, and all of patient's questions were answered to his expressed satisfaction.  -- Marilynne Drivers Rosana Hoes, MD, [facility]: [facility] for exceptional care. Office: [phone]

## 2018-03-08 ENCOUNTER — Inpatient Hospital Stay: Payer: BLUE CROSS/BLUE SHIELD

## 2018-03-08 LAB — CBC
HEMATOCRIT: 39.4 % — AB (ref 40.0–52.0)
HEMOGLOBIN: 13.1 g/dL (ref 13.0–18.0)
MCH: 31 pg (ref 26.0–34.0)
MCHC: 33.2 g/dL (ref 32.0–36.0)
MCV: 93.4 fL (ref 80.0–100.0)
Platelets: 439 10*3/uL (ref 150–440)
RBC: 4.22 MIL/uL — ABNORMAL LOW (ref 4.40–5.90)
RDW: 13.1 % (ref 11.5–14.5)
WBC: 11.7 10*3/uL — ABNORMAL HIGH (ref 3.8–10.6)

## 2018-03-08 LAB — BASIC METABOLIC PANEL
Anion gap: 7 (ref 5–15)
BUN: 60 mg/dL — AB (ref 6–20)
CHLORIDE: 97 mmol/L — AB (ref 101–111)
CO2: 30 mmol/L (ref 22–32)
CREATININE: 1.52 mg/dL — AB (ref 0.61–1.24)
Calcium: 9.6 mg/dL (ref 8.9–10.3)
GFR calc Af Amer: 53 mL/min — ABNORMAL LOW (ref >60)
GFR calc non Af Amer: 45 mL/min — ABNORMAL LOW (ref >60)
Glucose, Bld: 108 mg/dL — ABNORMAL HIGH (ref 65–99)
Potassium: 5.7 mmol/L — ABNORMAL HIGH (ref 3.5–5.1)
Sodium: 134 mmol/L — ABNORMAL LOW (ref 135–145)

## 2018-03-08 MED ORDER — TALC (STERITALC) POWDER FOR INTRAPLEURAL USE
4.0000 g | Freq: Once | INTRAVENOUS | Status: DC
  Filled 2018-03-08: qty 4

## 2018-03-08 MED ORDER — LIDOCAINE HCL 1 % IJ SOLN
10.0000 mL | Freq: Once | INTRAMUSCULAR | Status: DC
  Filled 2018-03-08: qty 10

## 2018-03-08 MED ORDER — TALC (STERITALC) POWDER FOR INTRAPLEURAL USE
4.0000 g | Freq: Once | INTRAPLEURAL | Status: DC
  Filled 2018-03-08: qty 4

## 2018-03-08 NOTE — Progress Notes (Signed)
PT Cancellation Note  Patient Details Name: Joseph Barron MRN: [id number] DOB: [birth year]   Cancelled Treatment:    Reason Eval/Treat Not Completed: Medical issues which prohibited therapy(Chart reviewed for attempt at re-evaluation (status post second chest tube placement).  Noted with critical lab values (K+ 5.7); contraindicated for exertional activity at this time.  Will continue efforts next date as medically appropriate.)   Clayburn Weekly H. Owens Shark, PT, DPT, NCS 03/08/18, 1:00 PM [phone]

## 2018-03-08 NOTE — Progress Notes (Signed)
Patient ID: Joseph Barron, male   DOB: 05-Jan-1951, 67 y.o.   MRN: [id number] Overall the patient is doing well.  He does not complain of any pain or shortness of breath.  He has been able to move around some in the hospital but because he has multiple tubes this has been somewhat limited.  However he does not complain of any significant shortness of breath.  He did have several x-rays last week showing an enlarging pneumothorax which required chest tube insertion.  He now currently has 2 tubes in place.  The first tube is from his surgical procedure 2 weeks ago and the other tube is from a percutaneously placed pigtail catheter.  There is still a small to moderate-sized air leak from the surgically placed tube.  His lungs are diminished slightly at the right.  There is an air leak present on the larger tube.  I did cleanse around all of his sites and the tubes were secured.  The skin sites are clean and dry.  His heart is regular.  I had a long discussion today with the patient regarding the options.  We could continue to monitor the patient and try to place a Heimlich valve with potential discharge to home.  He understands the implications of that decision.  I also discussed with him the possibility of performing a talc pleurodesis.  I think that given the extent of his underlying emphysematous changes that this is most likely to give [place] the best long-term result.  I explained to the patient that this is done in the operating room under moderate sedation.  We will place the talc through the chest tube.  I will try to arrange for that tomorrow morning.  The patient understands the risks.  Risks of continued air leak, respiratory insufficiency and death were all discussed.  We will plan for talc pleurodesis via the existing chest tube tomorrow.

## 2018-03-08 NOTE — Care Management Important Message (Signed)
Copy of signed IM left in patient's room.    

## 2018-03-08 NOTE — Progress Notes (Signed)
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  GOAL:   Patient will meet greater than or equal to 90% of their needs  -progressing  MONITOR:   PO intake, Supplement acceptance, Labs, Weight trends, Skin, I & O's  ASSESSMENT:   67 y/o male with h/o COPD, admitted with new lung mass s/p right muscle-sparing thoracotomy with wedge resection of right upper lobe mass; pleural biopsy 4/30 Pt s/p colonoscopy with multiple polyps removed 4/29. Pt s/p second chest tube placement 5/10  Pt with improved appetite and oral intake; pt eating 85-100% of most meals but occasionally taking just bites. Pt is drinking most of his Ensure. No new weight since admit; will request new weight. Per MD note, "anticipate chest tubes to waterseal tomorrow and possible removal of one or both pending resolution of air leak."  Medications reviewed and include: dulcolax, lovenox, protonix  Labs reviewed:   Diet Order:   Diet Order           Diet NPO time specified Except for: Sips with Meds  Diet effective midnight        Diet regular Room service appropriate? Yes; Fluid consistency: Thin  Diet effective now         EDUCATION NEEDS:   Education needs have been addressed  Skin:  Skin Assessment: (incision chest)  Last BM:  5/12- type 6  Height:   Ht Readings from Last 1 Encounters:  02/23/18 5\' 9"  (1.753 m)    Weight:   Wt Readings from Last 1 Encounters:  02/23/18 158 lb 8.2 oz (71.9 kg)    Ideal Body Weight:  72.7 kg  BMI:  Body mass index is 23.41  kg/m.  Estimated Nutritional Needs:   Kcal:  1900-2200kcal/day   Protein:  93-108g/day   Fluid:  >1.9L/day   Koleen Distance MS, RD, LDN Pager #[phone] After Hours Pager: [phone]

## 2018-03-09 ENCOUNTER — Encounter
Admission: RE | Disposition: A | Discharge status: Home / Self Care | Payer: Self-pay | Source: Ambulatory Visit | Attending: Surgery

## 2018-03-09 ENCOUNTER — Inpatient Hospital Stay: Payer: BLUE CROSS/BLUE SHIELD

## 2018-03-09 LAB — POTASSIUM
Potassium: 6 mmol/L — ABNORMAL HIGH (ref 3.5–5.1)
Potassium: 5.5 mmol/L — ABNORMAL HIGH (ref 3.5–5.1)

## 2018-03-09 SURGERY — CHEST TUBE INSERTION
Anesthesia: Monitor Anesthesia Care

## 2018-03-09 MED ORDER — SODIUM POLYSTYRENE SULFONATE 15 GM/60ML PO SUSP
30.0000 g | Freq: Once | ORAL | Status: AC
  Administered 2018-03-09: 30 g via ORAL
  Filled 2018-03-09: qty 120

## 2018-03-09 MED ORDER — ALBUTEROL SULFATE (2.5 MG/3ML) 0.083% IN NEBU: 2.5000 mg | INHALATION_SOLUTION | Freq: Four times a day (QID) | RESPIRATORY_TRACT | Status: DC | PRN

## 2018-03-09 NOTE — Progress Notes (Signed)
PT Cancellation Note  Patient Details Name: Joseph Barron MRN: [id number] DOB: [birth year]   Cancelled Treatment:    Reason Eval/Treat Not Completed: Medical issues which prohibited therapy.  Chart reviewed for PT re-evaluation attempt (pt s/p second chest tube placement).  Pt noted with critical lab value of K+ 6.0 today (elevated from 5.7 yesterday); contraindicated for exertional activity at this time.  Will continue efforts at a later date/time as medically appropriate.  Leitha Bleak, PT 03/09/18, 1:41 PM [phone]

## 2018-03-09 NOTE — Progress Notes (Signed)
Patient ID: GWEN EDLER, male   DOB: 01-10-1951, 67 y.o.   MRN: [id number]  He has no complaints today.  He does not have any pain.  He has a vigorous cough and there is no air leak.  I examined all the tubes and connectors.  There is no evidence of obstruction.  The chest x-ray from today was independently reviewed and shows a stable lateral pneumothorax.  I could not elicit any air leak even with the most vigorous of coughs.  Therefore I clamped the pigtail catheter drain which has not had an air leak for over 48 hours.  The surgically placed chest tube will be placed to waterseal.  We will repeat his x-ray this afternoon.  We will hold off on any pleurodesis today.  His lungs sound clear on the left with mechanical breath sounds on the right..  I changed all of his dressings yesterday and I will do so again today.  Repeat chest x-ray at noon time

## 2018-03-10 ENCOUNTER — Inpatient Hospital Stay: Payer: BLUE CROSS/BLUE SHIELD

## 2018-03-10 DIAGNOSIS — E785 Hyperlipidemia, unspecified: Secondary | ICD-10-CM

## 2018-03-10 DIAGNOSIS — C7A1 Malignant poorly differentiated neuroendocrine tumors: Secondary | ICD-10-CM

## 2018-03-10 DIAGNOSIS — Z800 Family history of malignant neoplasm of digestive organs: Secondary | ICD-10-CM

## 2018-03-10 DIAGNOSIS — I10 Essential (primary) hypertension: Secondary | ICD-10-CM

## 2018-03-10 DIAGNOSIS — R109 Unspecified abdominal pain: Secondary | ICD-10-CM

## 2018-03-10 DIAGNOSIS — K219 Gastro-esophageal reflux disease without esophagitis: Secondary | ICD-10-CM

## 2018-03-10 DIAGNOSIS — M549 Dorsalgia, unspecified: Secondary | ICD-10-CM

## 2018-03-10 DIAGNOSIS — J939 Pneumothorax, unspecified: Secondary | ICD-10-CM

## 2018-03-10 DIAGNOSIS — Z87891 Personal history of nicotine dependence: Secondary | ICD-10-CM

## 2018-03-10 LAB — COMPREHENSIVE METABOLIC PANEL
ALK PHOS: 134 U/L — AB (ref 38–126)
ALT: 74 U/L — ABNORMAL HIGH (ref 17–63)
ANION GAP: 7 (ref 5–15)
AST: 53 U/L — ABNORMAL HIGH (ref 15–41)
Albumin: 3.1 g/dL — ABNORMAL LOW (ref 3.5–5.0)
BILIRUBIN TOTAL: 0.5 mg/dL (ref 0.3–1.2)
BUN: 49 mg/dL — ABNORMAL HIGH (ref 6–20)
CALCIUM: 9.5 mg/dL (ref 8.9–10.3)
CO2: 30 mmol/L (ref 22–32)
Chloride: 101 mmol/L (ref 101–111)
Creatinine, Ser: 1.31 mg/dL — ABNORMAL HIGH (ref 0.61–1.24)
GFR, EST NON AFRICAN AMERICAN: 54 mL/min — AB (ref >60)
Glucose, Bld: 104 mg/dL — ABNORMAL HIGH (ref 65–99)
Potassium: 4.9 mmol/L (ref 3.5–5.1)
Sodium: 138 mmol/L (ref 135–145)
TOTAL PROTEIN: 7.3 g/dL (ref 6.5–8.1)

## 2018-03-10 LAB — CBC
HEMATOCRIT: 38.2 % — AB (ref 40.0–52.0)
HEMOGLOBIN: 12.8 g/dL — AB (ref 13.0–18.0)
MCH: 31.6 pg (ref 26.0–34.0)
MCHC: 33.5 g/dL (ref 32.0–36.0)
MCV: 94.4 fL (ref 80.0–100.0)
Platelets: 453 10*3/uL — ABNORMAL HIGH (ref 150–440)
RBC: 4.05 MIL/uL — ABNORMAL LOW (ref 4.40–5.90)
RDW: 13.4 % (ref 11.5–14.5)
WBC: 13.3 10*3/uL — ABNORMAL HIGH (ref 3.8–10.6)

## 2018-03-10 MED ORDER — AMLODIPINE BESYLATE 5 MG PO TABS
5.0000 mg | ORAL_TABLET | Freq: Every day | ORAL | Status: DC
  Administered 2018-03-10: 5 mg via ORAL
  Filled 2018-03-10: qty 1

## 2018-03-10 MED ORDER — METOPROLOL SUCCINATE ER 50 MG PO TB24
50.0000 mg | ORAL_TABLET | Freq: Every day | ORAL | Status: DC
  Administered 2018-03-10: 50 mg via ORAL
  Filled 2018-03-10: qty 1

## 2018-03-10 NOTE — Consult Note (Signed)
C/s seen and examined. Full note to follow.  K is normalized. Nothing to do at this time. Will monitor. D/w Dr Dahlia Byes

## 2018-03-10 NOTE — Consult Note (Signed)
[facility] NAME: Joseph Barron    MR#:  [id number]  DATE OF BIRTH:  [birth year]  DATE OF ADMISSION:  02/23/2018  PRIMARY CARE PHYSICIAN: Dion Body, MD   REQUESTING/REFERRING PHYSICIAN: Nestor Lewandowsky MD  CHIEF COMPLAINT:  No chief complaint on file.   HISTORY OF PRESENT ILLNESS:  Joseph Barron  is a 67 y.o. male with a known history of Pulmonary Nodules, HTN, Hyperlipidemia, GERD, Enlarging NSCLC of RUL, and Borderline Diabetes Mellitus. He presented to [facility] on 04/20 for an elective muscle sparing right thoracotomy with wedge resection of right upper lobe mass and pleural biopsy to rule out mesothelioma.  During procedure a single 29 French chest tube was inserted at the apex of the chest for Unresolved Right basilar pneumothorax and continued air leak.  We are consulted today for Hyperkalemia. K is already normalized (s/p kayexalate 30 gm) when I saw him and he's not symptomatic. PAST MEDICAL HISTORY:   Past Medical History:  Diagnosis Date  . 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 Cancer ([place])    lung  . GERD (gastroesophageal reflux disease)   . Hyperlipemia   . Hypertension   . Pulmonary nodules 01/13/2018  . Vaccine counseling 12/22/2017    PAST SURGICAL HISTOIRY:   Past Surgical History:  Procedure Laterality Date  . COLONOSCOPY WITH PROPOFOL N/A 02/22/2018   Procedure: COLONOSCOPY WITH PROPOFOL;  Surgeon: Manya Silvas, MD;  Location: [facility] ENDOSCOPY;  Service: Endoscopy;  Laterality: N/A;  . EYE SURGERY Left child  . THORACOTOMY/LOBECTOMY Right 02/23/2018   Procedure: THORACOTOMY/ WEDGE RESECTION;  Surgeon: Nestor Lewandowsky, MD;  Location: [facility] ORS;  Service: Thoracic;  Laterality: Right;  Marland Kitchen VIDEO BRONCHOSCOPY N/A 02/23/2018   Procedure: PREOP BRONCHOSCOPY;  Surgeon: Nestor Lewandowsky, MD;  Location: [facility] ORS;  Service: Thoracic;  Laterality: N/A;    SOCIAL HISTORY:   Social History   Tobacco Use  .  Smoking status: Former Smoker    Packs/day: 1.00    Years: 50.00    Pack years: 50.00    Types: Cigarettes    Last attempt to quit: 11/17/2017    Years since quitting: 0.3  . Smokeless tobacco: Former Systems developer    Types: Chew    Quit date: 01/15/1989  Substance Use Topics  . Alcohol use: Yes    Alcohol/week: 5.4 oz    Types: 7 Cans of beer, 2 Shots of liquor per week    FAMILY HISTORY:   Family History  Problem Relation Age of Onset  . Arthritis Mother   . COPD Father   . Coronary artery disease Father   . Transient ischemic attack Father   . Alcoholism Father   . Bowel Disease Father   . COPD Sister   . Prostatitis Brother   . Thyroid disease Sister   . Thyroid disease Sister   . Liver cancer Brother   . Diabetes Brother     DRUG ALLERGIES:  No Known Allergies  REVIEW OF SYSTEMS:  CONSTITUTIONAL: No fever, fatigue or weakness.  EYES: No blurred or double vision.  EARS, NOSE, AND THROAT: No tinnitus or ear pain.  RESPIRATORY: No cough, shortness of breath, wheezing or hemoptysis.  CARDIOVASCULAR: No chest pain, orthopnea, edema.  GASTROINTESTINAL: No nausea, vomiting, diarrhea or abdominal pain.  GENITOURINARY: No dysuria, hematuria.  ENDOCRINE: No polyuria, nocturia,  HEMATOLOGY: No anemia, easy bruising or bleeding SKIN: No rash or lesion. MUSCULOSKELETAL: No joint pain or arthritis.   NEUROLOGIC: No  tingling, numbness, weakness.  PSYCHIATRY: No anxiety or depression.  MEDICATIONS AT HOME:   Prior to Admission medications   Medication Sig Start Date End Date Taking? Authorizing Provider  atorvastatin (LIPITOR) 10 MG tablet Take 10 mg by mouth at bedtime.  01/13/18  Yes [provider]  lisinopril (PRINIVIL,ZESTRIL) 20 MG tablet Take 20 mg by mouth at bedtime.  01/13/18  Yes [provider]  metoprolol succinate (TOPROL-XL) 25 MG 24 hr tablet Take 25 mg by mouth at bedtime.  01/25/18 01/25/19 Yes [provider]  omeprazole (PRILOSEC) 20 MG  capsule Take 40 mg by mouth daily.  12/22/17 12/22/18 Yes [provider]      VITAL SIGNS:  Blood pressure (!) 150/73, pulse 82, temperature 98.2 F (36.8 C), temperature source Oral, resp. rate 20, height 5\' 9"  (1.753 m), weight 67.3 kg (148 lb 6.4 oz), SpO2 94 %.  PHYSICAL EXAMINATION:  GENERAL:  67 y.o.-year-old patient lying in the bed with no acute distress.  EYES: Pupils equal, round, reactive to light and accommodation. No scleral icterus. Extraocular muscles intact.  HEENT: Head atraumatic, normocephalic. Oropharynx and nasopharynx clear.  NECK:  Supple, no jugular venous distention. No thyroid enlargement, no tenderness.  LUNGS: Normal breath sounds bilaterally, no wheezing, rales,rhonchi or crepitation. No use of accessory muscles of respiration.  CARDIOVASCULAR: S1, S2 normal. No murmurs, rubs, or gallops.  ABDOMEN: Soft, nontender, nondistended. Bowel sounds present. No organomegaly or mass.  EXTREMITIES: No pedal edema, cyanosis, or clubbing.  NEUROLOGIC: Cranial nerves II through XII are intact. Muscle strength 5/5 in all extremities. Sensation intact. Gait not checked.  PSYCHIATRIC: The patient is alert and oriented x 3.  SKIN: No obvious rash, lesion, or ulcer.  LABORATORY PANEL:   CBC Recent Labs  Lab 03/10/18 0414  WBC 13.3*  HGB 12.8*  HCT 38.2*  PLT 453*   ------------------------------------------------------------------------------------------------------------------  Chemistries  Recent Labs  Lab 03/10/18 0414  NA 138  K 4.9  CL 101  CO2 30  GLUCOSE 104*  BUN 49*  CREATININE 1.31*  CALCIUM 9.5  AST 53*  ALT 74*  ALKPHOS 134*  BILITOT 0.5   ------------------------------------------------------------------------------------------------------------------  Cardiac Enzymes No results for input(s): TROPONINI in the last 168  hours. ------------------------------------------------------------------------------------------------------------------  RADIOLOGY:  Dg Chest 2 View  Result Date: 03/10/2018 CLINICAL DATA:  Postop chest tube evaluation, history of air leak post thoracotomy and right upper lobe wedge resection on 02/23/2017 EXAM: CHEST - 2 VIEW COMPARISON:  Chest x-ray of 03/09/2018 and CT chest of 03/04/2017 FINDINGS: There is no change in volume of the right lateral pneumothorax with 2 right chest tubes remaining. Surgical staples overlie the right lower hemithorax. Mild basilar atelectasis remains bilaterally. Heart size is stable IMPRESSION: 1. No change in small right pneumothorax with 2 right chest tubes remaining. 2. No change in bibasilar linear atelectasis. Electronically Signed   By: Ivar Drape M.D.   On: 03/10/2018 09:57   Dg Chest 2 View  Result Date: 03/09/2018 CLINICAL DATA:  Status post right thoracotomy, followup exam EXAM: CHEST - 2 VIEW COMPARISON:  03/09/2018 FINDINGS: Cardiac shadow is stable. Right chest tubes are again seen and stable. Lateral pneumothorax is again noted and stable. No new focal infiltrate is seen. No bony abnormality is noted. IMPRESSION: Stable right lateral pneumothorax. Electronically Signed   By: Inez Catalina M.D.   On: 03/09/2018 12:11   Dg Chest Port 1 View  Result Date: 03/09/2018 CLINICAL DATA:  Pneumothorax EXAM: PORTABLE CHEST 1 VIEW COMPARISON:  Yesterday FINDINGS: A drain or chest tube has been removed on the right. Small and large bore right-sided chest tubes are in stable position. Stable small lateral right pneumothorax. Postoperative right lung with stable reticular markings in volume loss. Chest wall emphysema is increased. Mild atelectasis at the left base. Normal heart size. Emphysema. IMPRESSION: Stable small lateral right pneumothorax. Electronically Signed   By: Monte Fantasia M.D.   On: 03/09/2018 07:13    EKG:   Orders placed or performed during  the hospital encounter of 02/18/18  . EKG test  . EKG test    IMPRESSION AND PLAN:  42 y m seen for hyperkalemia  * Hyperkalemia - K is tending down (6->5.5->4.9) - could be due to ARF (was on ace-I at home, would avoid that)  * HTN - increase dose of Toprol (from 25 to 50 mg) for better BP control  * Rt Pneumothorax - mgmt per surgery  * Hyperlipdemia - continue statin   All the records are reviewed and case discussed with Consulting provider. Management plans discussed with the patient, nursing and they are in agreement.  CODE STATUS: FULL CODE  TOTAL TIME TAKING CARE OF THIS PATIENT: 35 minutes.    Max Sane M.D on 03/10/2018 at 8:09 PM  Between 7am to 6pm - Pager - [phone]  After 6pm go to [email or web address] - Proofreader  [employer] [facility]  Office  [phone]  CC: Primary care Physician: Dion Body, MD   Note: This dictation was prepared with Dragon dictation along with smaller phrase technology. Any transcriptional errors that result from this process are unintentional.

## 2018-03-10 NOTE — Consult Note (Signed)
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  INTERVAL HISTORY: Patient is a 67 year old male who recently underwent a wedge resection for the above-stated malignancy.  He continues to have a chest tube in, but states this is likely being removed tomorrow.  He has no neurologic complaints.  He denies any recent fevers.  He admits to increased weakness and fatigue.  He has flank pain the site of his chest tube insertion.  He denies any shortness of breath, but has chronic cough.  He has no nausea, vomiting, constipation, or diarrhea.  He has no urinary complaints.  Patient offers no further specific complaints.  REVIEW OF SYSTEMS:   Review of Systems  Constitutional: Positive for malaise/fatigue. Negative for fever and weight loss.  Respiratory: Positive for cough. Negative for hemoptysis and shortness of breath.   Cardiovascular: Negative.  Negative for chest pain and leg swelling.  Gastrointestinal: Negative.  Negative for abdominal pain.  Genitourinary: Positive for flank pain.  Musculoskeletal: Positive for back pain.  Skin: Negative.  Negative for rash.  Neurological: Negative.  Negative for tingling, sensory change and speech change.  Psychiatric/Behavioral: Negative.  The patient is not nervous/anxious.     As per HPI. Otherwise, a complete review of systems is negative.  PAST MEDICAL HISTORY: Past Medical History:  Diagnosis Date  . 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 Cancer ([place])    lung  . GERD (gastroesophageal reflux disease)   . Hyperlipemia   . Hypertension   . Pulmonary nodules 01/13/2018  . Vaccine counseling 12/22/2017    PAST SURGICAL HISTORY: Past Surgical  History:  Procedure Laterality Date  . COLONOSCOPY WITH PROPOFOL N/A 02/22/2018   Procedure: COLONOSCOPY WITH PROPOFOL;  Surgeon: Manya Silvas, MD;  Location: [facility] ENDOSCOPY;  Service: Endoscopy;  Laterality: N/A;  . EYE SURGERY Left child  . THORACOTOMY/LOBECTOMY Right 02/23/2018   Procedure: THORACOTOMY/ WEDGE RESECTION;  Surgeon: Nestor Lewandowsky, MD;  Location: [facility] ORS;  Service: Thoracic;  Laterality: Right;  Marland Kitchen VIDEO BRONCHOSCOPY N/A 02/23/2018   Procedure: PREOP BRONCHOSCOPY;  Surgeon: Nestor Lewandowsky, MD;  Location: [facility] ORS;  Service: Thoracic;  Laterality: N/A;    FAMILY HISTORY: Family History  Problem Relation Age of Onset  . Arthritis Mother   . COPD Father   . Coronary artery disease Father   . Transient ischemic attack Father   . Alcoholism Father   . Bowel Disease Father   . COPD Sister   . Prostatitis Brother   . Thyroid disease Sister   . Thyroid disease Sister   . Liver cancer Brother   . Diabetes Brother     ADVANCED DIRECTIVES (Y/N):  @ADVDIR @  HEALTH MAINTENANCE: Social History   Tobacco Use  . Smoking status: Former Smoker    Packs/day: 1.00    Years: 50.00    Pack years: 50.00    Types: Cigarettes    Last attempt to quit: 11/17/2017    Years since quitting: 0.3  . Smokeless tobacco: Former Systems developer    Types: Chew    Quit date: 01/15/1989  Substance Use Topics  . Alcohol use: Yes    Alcohol/week: 5.4 oz    Types: 7 Cans of beer, 2 Shots of liquor per week  . Drug use: Never  Colonoscopy:  PAP:  Bone density:  Lipid panel:  No Known Allergies  Current Facility-Administered Medications  Medication Dose Route Frequency Provider Last Rate Last Dose  . albuterol (PROVENTIL) (2.5 MG/3ML) 0.083% nebulizer solution 2.5 mg  2.5 mg Nebulization Q6H PRN Florene Glen, MD      . alum & mag hydroxide-simeth (MAALOX/MYLANTA) 200-200-20 MG/5ML suspension 30 mL  30 mL Oral Q4H PRN Pabon, Diego F, MD   30 mL at 03/03/18 1720  . atorvastatin (LIPITOR)  tablet 10 mg  10 mg Oral QHS Nestor Lewandowsky, MD   10 mg at 03/10/18 2149  . bisacodyl (DULCOLAX) EC tablet 10 mg  10 mg Oral Daily Nestor Lewandowsky, MD   5 mg at 03/07/18 2137  . enoxaparin (LOVENOX) injection 40 mg  40 mg Subcutaneous Q24H Piscoya, Jose, MD   40 mg at 03/10/18 2147  . feeding supplement (ENSURE ENLIVE) (ENSURE ENLIVE) liquid 237 mL  237 mL Oral TID BM Piscoya, Jose, MD   237 mL at 03/10/18 2033  . lidocaine (XYLOCAINE) 1 % (with pres) injection 10 mL  10 mL Intrapleural Once Nestor Lewandowsky, MD      . MEDLINE mouth rinse  15 mL Mouth Rinse BID Nestor Lewandowsky, MD   15 mL at 03/10/18 2147  . metoprolol succinate (TOPROL-XL) 24 hr tablet 50 mg  50 mg Oral QHS Max Sane, MD   50 mg at 03/10/18 2148  . ondansetron (ZOFRAN) injection 4 mg  4 mg Intravenous Q6H PRN Nestor Lewandowsky, MD      . oxyCODONE-acetaminophen (PERCOCET/ROXICET) 5-325 MG per tablet 1-2 tablet  1-2 tablet Oral Q4H PRN Nestor Lewandowsky, MD   2 tablet at 02/25/18 0359  . pantoprazole (PROTONIX) EC tablet 40 mg  40 mg Oral Daily Nestor Lewandowsky, MD   40 mg at 03/10/18 2148    OBJECTIVE: Vitals:   03/10/18 2006 03/10/18 2147  BP: (!) 150/73 (!) 160/85  Pulse: 82 85  Resp: 20   Temp: 98.2 F (36.8 C)   SpO2: 94%      Body mass index is 21.91 kg/m.    ECOG FS:1 - Symptomatic but completely ambulatory  General: Well-developed, well-nourished, no acute distress. Eyes: Pink conjunctiva, anicteric sclera. HEENT: Normocephalic, moist mucous membranes, clear oropharnyx. Lungs: Clear to auscultation bilaterally.  Chest tube noted in right chest wall.  Heart: Regular rate and rhythm. No rubs, murmurs, or gallops. Abdomen: Soft, nontender, nondistended. No organomegaly noted, normoactive bowel sounds. Musculoskeletal: No edema, cyanosis, or clubbing. Neuro: Alert, answering all questions appropriately. Cranial nerves grossly intact. Skin: No rashes or petechiae noted. Psych: Normal affect.  LAB RESULTS:  Lab Results    Component Value Date   NA 138 03/10/2018   K 4.9 03/10/2018   CL 101 03/10/2018   CO2 30 03/10/2018   GLUCOSE 104 (H) 03/10/2018   BUN 49 (H) 03/10/2018   CREATININE 1.31 (H) 03/10/2018   CALCIUM 9.5 03/10/2018   PROT 7.3 03/10/2018   ALBUMIN 3.1 (L) 03/10/2018   AST 53 (H) 03/10/2018   ALT 74 (H) 03/10/2018   ALKPHOS 134 (H) 03/10/2018   BILITOT 0.5 03/10/2018   GFRNONAA 54 (L) 03/10/2018   GFRAA >60 03/10/2018    Lab Results  Component Value Date   WBC 13.3 (H) 03/10/2018   NEUTROABS 7.1 (H) 02/25/2018   HGB 12.8 (L) 03/10/2018   HCT 38.2 (L) 03/10/2018   MCV 94.4 03/10/2018   PLT 453 (H) 03/10/2018     STUDIES: Dg Chest 2  View  Result Date: 03/10/2018 CLINICAL DATA:  Postop chest tube evaluation, history of air leak post thoracotomy and right upper lobe wedge resection on 02/23/2017 EXAM: CHEST - 2 VIEW COMPARISON:  Chest x-ray of 03/09/2018 and CT chest of 03/04/2017 FINDINGS: There is no change in volume of the right lateral pneumothorax with 2 right chest tubes remaining. Surgical staples overlie the right lower hemithorax. Mild basilar atelectasis remains bilaterally. Heart size is stable IMPRESSION: 1. No change in small right pneumothorax with 2 right chest tubes remaining. 2. No change in bibasilar linear atelectasis. Electronically Signed   By: Ivar Drape M.D.   On: 03/10/2018 09:57   Dg Chest 2 View  Result Date: 03/09/2018 CLINICAL DATA:  Status post right thoracotomy, followup exam EXAM: CHEST - 2 VIEW COMPARISON:  03/09/2018 FINDINGS: Cardiac shadow is stable. Right chest tubes are again seen and stable. Lateral pneumothorax is again noted and stable. No new focal infiltrate is seen. No bony abnormality is noted. IMPRESSION: Stable right lateral pneumothorax. Electronically Signed   By: Inez Catalina M.D.   On: 03/09/2018 12:11   Dg Chest 2 View  Result Date: 02/27/2018 CLINICAL DATA:  66 year old male with a history of thoracotomy 02/23/2018 with right-sided  chest tube EXAM: CHEST - 2 VIEW COMPARISON:  02/25/2018, 02/23/2018, chest CT 01/12/2018 FINDINGS: Cardiomediastinal silhouette unchanged in size and contour. There has been interval placement of a new right-sided thoracostomy tube, directed laterally and terminating in the previously identified residual pneumothorax. Unchanged position of the previous right thoracostomy tube, terminating at the apex. Pleuroparenchymal thickening with opacity at the periphery of the right lung and asymmetric elevation of the right hemidiaphragm. No visualized residual pneumothorax. Left lung unchanged with coarsened interstitial markings. Surgical clips of the right lower chest. Surgical drain within the soft tissues. No acute displaced rib fracture IMPRESSION: Interval placement of additional right-sided thoracostomy tube which terminates at the lateral aspect of the pleural space. Unchanged previous thoracostomy tube. Pleuroparenchymal thickening on the lateral right, likely a combination of treatment changes and residual pleural fluid. No visualized pneumothorax. Electronically Signed   By: Corrie Mckusick D.O.   On: 02/27/2018 09:19   Dg Chest 2 View  Result Date: 02/25/2018 CLINICAL DATA:  Postop.  Chest tube. EXAM: CHEST - 2 VIEW COMPARISON:  02/25/2018 FINDINGS: Stable right chest tube. There is a second drain coiled in the soft tissues of the right inferior lateral chest soft tissues. Stable tiny right lateral pneumothorax. Heterogeneous opacities in the right mid and lower lung are stable. Subsegmental atelectasis at the left base is stable. Normal heart size. IMPRESSION: Stable right chest tube.  Tiny stable pneumothorax. Stable opacities in the right lung. Electronically Signed   By: Marybelle Killings M.D.   On: 02/25/2018 16:11   Dg Chest 2 View  Result Date: 02/25/2018 CLINICAL DATA:  Status post right upper lobectomy and bronchoscopy on February 23, 2018 for an abnormal pulmonary nodule. EXAM: CHEST - 2 VIEW COMPARISON:   Portable chest x-ray of today's date and February 23, 2018 FINDINGS: There is volume loss on the right. There is a small superolateral pneumothorax which is stable. The right chest tube tip projects over the posterior aspect of the second rib. There is stable pleural thickening. A wound VAC tube projects over the right lateral costophrenic angle. The left lung is hypoinflated. The interstitial markings are mildly prominent though stable. The cardiac silhouette is enlarged. The pulmonary vascularity is normal. There is moderate gaseous distention of bowel loops in the  upper abdomen. IMPRESSION: Stable small right superolateral pneumothorax with small amount of pleural fluid inferior to it. The chest tube is in stable position. Mild cardiomegaly without pulmonary vascular congestion or pulmonary edema. Electronically Signed   By: David  Martinique M.D.   On: 02/25/2018 11:03   Chest 2 View  Result Date: 02/19/2018 CLINICAL DATA:  Preop. EXAM: CHEST - 2 VIEW COMPARISON:  CT chest 01/12/2018.  PET scan 01/20/2018. FINDINGS: Normal heart size. Fibrotic change throughout the lung fields consistent with COPD. RIGHT upper lobe mass redemonstrated, roughly 1.5 cm in size. No effusion or pneumothorax. No osseous findings. IMPRESSION: Peripheral RIGHT upper lobe opacity concerning for lung cancer. Electronically Signed   By: Staci Righter M.D.   On: 02/19/2018 08:07   Ct Chest Wo Contrast  Result Date: 03/04/2018 CLINICAL DATA:  Persistent air leak status post right thoracotomy and right upper lobe wedge resection 9 days ago EXAM: CT CHEST WITHOUT CONTRAST TECHNIQUE: Multidetector CT imaging of the chest was performed following the standard protocol without IV contrast. COMPARISON:  CT scan of January 12, 2018. FINDINGS: Cardiovascular: Atherosclerosis of thoracic aorta is noted without aneurysm formation. Normal cardiac size. No pericardial effusion is noted. Mediastinum/Nodes: No enlarged mediastinal or axillary lymph nodes.  Thyroid gland, trachea, and esophagus demonstrate no significant findings. Lungs/Pleura: Interval placement of right-sided chest tube with mild pneumothorax is noted apically and and basilar region. Emphysematous disease is noted in the upper lobes bilaterally. Right lower lobe atelectasis is noted. Postsurgical changes are noted laterally in the right upper lobe. No significant pleural effusion is noted. Upper Abdomen: No acute abnormality. Musculoskeletal: Mildly displaced right sixth rib fracture is noted most likely postsurgical in etiology. IMPRESSION: Mild right apical and basilar pneumothorax is noted. Interval placement of right-sided chest tube with tip in right lung apex. Postsurgical changes are noted involving the lateral portion of right upper lobe. Right basilar subsegmental atelectasis is noted. Aortic Atherosclerosis (ICD10-I70.0) and Emphysema (ICD10-J43.9). Electronically Signed   By: Marijo Conception, M.D.   On: 03/04/2018 15:08   Dg Chest Port 1 View  Result Date: 03/09/2018 CLINICAL DATA:  Pneumothorax EXAM: PORTABLE CHEST 1 VIEW COMPARISON:  Yesterday FINDINGS: A drain or chest tube has been removed on the right. Small and large bore right-sided chest tubes are in stable position. Stable small lateral right pneumothorax. Postoperative right lung with stable reticular markings in volume loss. Chest wall emphysema is increased. Mild atelectasis at the left base. Normal heart size. Emphysema. IMPRESSION: Stable small lateral right pneumothorax. Electronically Signed   By: Monte Fantasia M.D.   On: 03/09/2018 07:13   Dg Chest Port 1 View  Result Date: 03/08/2018 CLINICAL DATA:  Follow-up right pneumothorax EXAM: PORTABLE CHEST 1 VIEW COMPARISON:  03/07/2018 FINDINGS: Multiple drainage catheters are noted on the right. The overall appearance of the lateral pneumothorax is stable. Improved aeration in the right base is seen. Resolution of left basilar atelectasis is noted as well.  Postsurgical changes are again seen. No new focal abnormality is noted. IMPRESSION: Stable right lateral pneumothorax. Improved aeration in the right base is seen. Electronically Signed   By: Inez Catalina M.D.   On: 03/08/2018 07:12   Dg Chest Port 1 View  Result Date: 03/07/2018 CLINICAL DATA:  Right pneumothorax EXAM: PORTABLE CHEST 1 VIEW COMPARISON:  Mar 06, 2018 FINDINGS: The heart size and mediastinal contours are stable. Two right-sided chest tubes are identified unchanged. Tiny lateral right pneumothorax is unchanged. Patchy consolidation of left lung base is  noted probably due to atelectasis. Minimal left pleural effusion is noted. The visualized skeletal structures are unremarkable. IMPRESSION: Right chest tube is unchanged. Tiny lateral right pneumothorax unchanged. Electronically Signed   By: Abelardo Diesel M.D.   On: 03/07/2018 07:19   Dg Chest Port 1 View  Result Date: 03/06/2018 CLINICAL DATA:  Right-sided pneumothorax.  Follow-up. EXAM: PORTABLE CHEST 1 VIEW COMPARISON:  Mar 04, 2018 FINDINGS: Again noted is a right chest tube terminating near the right apex. There is a new pigtail catheter projected over the right hemidiaphragm. On this single study, the location of this catheter is unclear. The known right sided pneumothorax is stable. Opacity in the lateral right lung is stable. The cardiomediastinal silhouette and left lung are stable. IMPRESSION: 1. Stable right chest tube. 2. New pigtail catheter project over the right hemidiaphragm in uncertain location on this single view. 3. The right-sided pneumothorax is a little smaller in the interval. 4. No other changes. Electronically Signed   By: Dorise Bullion III M.D   On: 03/06/2018 07:14   Dg Chest Port 1 View  Result Date: 03/04/2018 CLINICAL DATA:  Right lobectomy on 02/23/2018. Continued air leak on suction. EXAM: PORTABLE CHEST 1 VIEW COMPARISON:  03/03/2018 FINDINGS: Cardiomediastinal silhouette is normal. Mediastinal contours  appear intact. Right chest tube in stable position. There is air lucency along the right hemidiaphragm. Small right pleural effusion. Persistent small right pneumothorax. Bibasilar atelectasis. Osseous structures are without acute abnormality. Soft tissues are grossly normal. IMPRESSION: Right-sided chest tube in stable position. Persistent right hydropneumothorax. Crescent-shaped air lucency along the right hemidiaphragm, new from the prior radiograph. This may represent subpleural pneumothorax or potentially pneumoperitoneum. Confirmation with decubital radiograph, left-side-down. May be considered. Bilateral atelectasis. Electronically Signed   By: Fidela Salisbury M.D.   On: 03/04/2018 12:17   Dg Chest Port 1 View  Result Date: 03/03/2018 CLINICAL DATA:  Follow-up right-sided pneumothorax EXAM: PORTABLE CHEST 1 VIEW COMPARISON:  03/02/2018 FINDINGS: Right chest tube is again identified and stable. The previously seen pneumothorax has resolved. Persistent changes are noted laterally in the right lung base. Left lung is stable. Cardiac shadow is stable as well. An additional drain is noted inferiorly over the lateral aspect of the right chest wall. IMPRESSION: Interval resolution of right-sided pneumothorax. The remainder of the exam is stable from the previous day. Electronically Signed   By: Inez Catalina M.D.   On: 03/03/2018 08:44   Dg Chest Port 1 View  Result Date: 03/02/2018 CLINICAL DATA:  History of right-sided pneumothorax EXAM: PORTABLE CHEST 1 VIEW COMPARISON:  Portable chest x-ray of 03/01/2017 and 02/28/2017 FINDINGS: Only a tiny right apical pneumothorax remains with right chest tube present. There is little change in probable fluid at the right lung base possibly loculated with mild bibasilar atelectasis as well mild atelectasis is the lump at the left lung base remains and heart size is stable. A drain is located over the periphery of the right upper quadrant. IMPRESSION: 1. No change in  tiny right apical pneumothorax with right chest tube remaining. 2. No change in probable loculated fluid at the right lung base laterally with bibasilar atelectasis. Electronically Signed   By: Ivar Drape M.D.   On: 03/02/2018 09:40   Dg Chest Port 1 View  Result Date: 03/01/2018 CLINICAL DATA:  Follow-up right-sided pneumothorax demonstrated on chest x-ray of earlier today at 7:48 a.m. Patient was found have a kinked chest tube. Follow-up study. EXAM: PORTABLE CHEST 1 VIEW COMPARISON:  Chest x-ray  of earlier today at 7:48 a.m. FINDINGS: The pneumothorax has decreased slightly in volume. This is accentuated by the end expiratory nature of this chest x-ray. The right chest tube is stable overlying the posterior aspect of the second rib. There is persistent density peripherally in the right mid lung and at the right lung base. There is no significant mediastinal shift. The left lung is grossly clear. The heart and pulmonary vascularity are normal. IMPRESSION: Slight decrease in volume of the right-sided pneumothorax such that it likely occupies approximately 20% of the lung volume. Electronically Signed   By: David  Martinique M.D.   On: 03/01/2018 10:43   Dg Chest Port 1 View  Result Date: 03/01/2018 CLINICAL DATA:  Follow-up right-sided pneumothorax. Status post right lobectomy on February 23, 2018 for malignancy. EXAM: PORTABLE CHEST 1 VIEW COMPARISON:  Portable chest x-ray of Feb 28, 2018 FINDINGS: There remains volume loss on the right. There is an approximately 30% right-sided pneumothorax which has increased since the previous study. The large caliber right chest tube tip projects just inferior to the posterior aspect of the right second rib. A tubular structure projects over the lower lateral right hemithorax and is stable and may reflect a wound drain. There is atelectasis in the medial aspect of the right lung base. The left lung is clear. There is no significant mediastinal shift. The heart and pulmonary  vascularity are normal. There is calcification in the wall of the aortic arch. There is wedge-shaped parenchymal density in the right mid lung peripherally peripherally which is slightly better defined today. There is persistent atelectasis in the medial aspect of the right middle and lower lobe. IMPRESSION: Interval enlargement of the right pneumothorax now amounting to 30-40% of the lung volume. The right chest tube is in stable position. No significant pleural effusion. These results were called by telephone at the time of interpretation on 03/01/2018 at 9:51 am to [facility], [place], who verbally acknowledged these results. Electronically Signed   By: David  Martinique M.D.   On: 03/01/2018 09:53   Dg Chest Port 1 View  Result Date: 02/28/2018 CLINICAL DATA:  66 year old male status post right thoracotomy and wedge resection. Follow-up study. EXAM: PORTABLE CHEST 1 VIEW COMPARISON:  Chest x-ray 02/27/2018. FINDINGS: Postoperative changes of recent right-sided thoracotomy and wedge resection in the right upper lobe are noted. Postoperative architectural distortion in the periphery of the right lung with small loculated pleural fluid collection laterally, and small pneumothorax which has slightly increased in volume compared to the prior study, now occupying approximately 10-15% of the right hemithorax. Right-sided chest tubes are noted. One of these is directed into the apex of the right hemithorax, while the other appears coiled in the soft tissues overlying the right chest wall deep to skin staples (presumably a soft tissue drain). Basilar atelectasis in the left lung. No left pleural effusion. No left pneumothorax. No evidence of pulmonary edema. Heart size is normal. IMPRESSION: 1. Postoperative changes and support apparatus, as above. Please note slight interval enlargement of the pneumothorax component of the patient's small right hydropneumothorax. Electronically Signed   By: Vinnie Langton M.D.   On: 02/28/2018  10:22   Dg Chest Port 1 View  Result Date: 02/25/2018 CLINICAL DATA:  Postoperative radiograph EXAM: PORTABLE CHEST 1 VIEW COMPARISON:  Chest radiograph 02/23/2018 FINDINGS: Unchanged position at right chest tube with tip at the right lung apex. Cardiomediastinal contours are unchanged. Small peripheral right pneumothorax. Unchanged interstitial opacities. Focal opacities in the right mid lung are  likely postsurgical. IMPRESSION: New small peripheral right pneumothorax. Otherwise unchanged appearance of the lungs. Electronically Signed   By: Ulyses Jarred M.D.   On: 02/25/2018 04:55   Dg Chest Port 1 View  Result Date: 02/23/2018 CLINICAL DATA:  Status post right thoracotomy. History of bronchogenic malignancy of the right lung. EXAM: PORTABLE CHEST 1 VIEW COMPARISON:  Preoperative study of February 18, 2018 FINDINGS: The lungs are less well inflated today. The interstitial markings are mildly increased but not greatly changed from the previous study. There is small amount of increased parenchymal density in the right mid lung at the surgical site. There is no pneumothorax or significant pleural effusion. There are 2 chest tubes in place 1 of which likely reflects a wound VAC. The heart is top-normal in size. The pulmonary vascularity is normal. There is calcification in the wall of the aortic arch. IMPRESSION: Mild hypoinflation. Chronic interstitial prominence stable. Mild postsurgical change in the right lung. No pneumothorax or significant pleural effusion. Thoracic aortic atherosclerosis. Electronically Signed   By: David  Martinique M.D.   On: 02/23/2018 16:43   Ct Image Guided Drainage By Percutaneous Catheter  Result Date: 03/05/2018 CLINICAL DATA:  Status post wedge resection of right upper lobe lung adenocarcinoma with persistent air leak postoperatively and residual basilar pneumothorax. Request has been made to place a image guided chest tube to treat the residual pneumothorax. EXAM: CT GUIDED RIGHT  CHEST THORACOSTOMY TUBE PLACEMENT ANESTHESIA/SEDATION: 2.0 mg IV Versed 75 mcg IV Fentanyl Total Moderate Sedation Time:  25 minutes The patient's level of consciousness and physiologic status were continuously monitored during the procedure by Radiology nursing. PROCEDURE: The procedure, risks, benefits, and alternatives were explained to the patient. Questions regarding the procedure were encouraged and answered. The patient understands and consents to the procedure. A time out was performed prior to initiating the procedure. CT was performed through the mid to lower chest in a supine position. The right anterior chest wall was prepped with chlorhexidine in a sterile fashion, and a sterile drape was applied covering the operative field. A sterile gown and sterile gloves were used for the procedure. Local anesthesia was provided with 1% Lidocaine. Under CT guidance, an 18 gauge trocar needle was advanced into the anterior right pleural space. After confirming needle tip position, a guidewire was advanced into the pleural space. The tract was dilated over the wire and a 12 French pigtail drainage catheter advanced over the wire. Catheter position was confirmed by CT. The catheter was connected to a Pleur-evac device. It was secured at the skin with a Prolene retention suture, StatLock device and Vaseline gauze dressing. COMPLICATIONS: None FINDINGS: Initial imaging demonstrates residual anterior and basilar right pneumothorax. The chest tube was able to be advanced into the anterior and basilar pleural space with complete evacuation of air. IMPRESSION: CT-guided right chest thoracostomy tube placement. A 12 French catheter was advanced into the residual right anterior basilar pneumothorax cavity. The catheter was connected to a Pleur-evac device which will be connected to wall suction at -20 cm of water. Electronically Signed   By: Aletta Edouard M.D.   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  PLAN:    1. 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  No further interventions are needed.  Patient likely does not require adjuvant chemotherapy. He has been instructed to follow-up with his primary oncologist, Dr. Tasia Catchings, in the  cancer center approximately 1 week after discharge to discuss his final pathology results. 2.  Pneumothorax: Improving.  Possible removal of chest tube tomorrow with discharge from hospital.  Appreciate consult, call with questions.  Lloyd Huger, MD   03/10/2018 10:53 PM

## 2018-03-10 NOTE — Evaluation (Signed)
Physical Therapy Re-Evaluation Patient Details Name: Joseph Barron MRN: [id number] DOB: 1951-09-26 Today's Date: 03/10/2018   History of Present Illness  Pt is a 67 year old white male admitted 02/23/18 with an enlarging right upper lobe mass. 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 He was extensively evaluated and found to be a suitable candidate for surgery.  Pt is s/p R thoracotomy with wedge resection of right upper lobe mass.  Pt noted with enlarging pneumothorax and s/p 2nd chest tube placement 5/10 /19. PMH includes: borderline DM, cancer, GERD, HLD, HTN, and pulmonary nodules.  Clinical Impression  Pt seen for PT re-evaluation d/t extended hospitalization and s/p 2nd chest tube placement 03/05/18 secondary to enlarging pneumothorax.  Prior to hospital admission, pt was independent and working full time as Theme park manager.  Pt lives with his daughter in 1 level home with 4 steps to enter with B railings.  Currently pt is independent with transfers; SBA ambulating 200 feet with RW and CGA ambulating 200 feet with no AD (pt requesting to use RW first time ambulating around nursing station d/t not walking outside room recently; pt requesting sitting rest break between ambulation trials).  No loss of balance with ambulation (without AD) with head turns R/L/up/down (although decreased cadence noted).  HR 89-114 bpm and O2 90% or greater on room air during session.  No c/o pain during session.  Pt reports he has been ambulating to bathroom on own without AD and carrying chest tube system without any issues.  Pt would benefit from skilled PT to address noted impairments and functional limitations (see below for any additional details) during hospital stay.  Upon hospital discharge, recommend pt discharge to home with support of family.  No AD/DME needs anticipated upon hospital discharge; recommend follow-up with PCP for any further OP PT needs upon hospital discharge (CM notified of above).  PT POC/goals  updated.    Follow Up Recommendations Other (comment)(Follow up with PCP for any further OP PT needs)    Equipment Recommendations  None recommended by PT    Recommendations for Other Services       Precautions / Restrictions Precautions Precautions: Fall Precaution Comments: R chest tube precautions Restrictions Weight Bearing Restrictions: No      Mobility  Bed Mobility               General bed mobility comments: Deferred d/t pt sitting up on edge of bed beginning and end of therapy session (pt reporting no difficulties with bed mobility).  Transfers Overall transfer level: Independent Equipment used: None Transfers: Sit to/from Stand Sit to Stand: Independent         General transfer comment: steady safe transfers no AD  Ambulation/Gait Ambulation/Gait assistance: Supervision;Min guard Ambulation Distance (Feet): (200 feet x2) Assistive device: Rolling walker (2 wheeled);None Gait Pattern/deviations: Step-through pattern     General Gait Details: x200 feet with RW SBA (pt steady without loss of balance); x200 feet no AD CGA (pt also steady without loss of balance but mild decreased cadence noted)  Stairs            Wheelchair Mobility    Modified Rankin (Stroke Patients Only)       Balance Overall balance assessment: Needs assistance Sitting-balance support: No upper extremity supported;Feet supported Sitting balance-Leahy Scale: Normal Sitting balance - Comments: steady sitting reaching outside BOS   Standing balance support: No upper extremity supported Standing balance-Leahy Scale: Normal Standing balance comment: steady standing reaching outside BOS  Pertinent Vitals/Pain Pain Assessment: No/denies pain    Home Living Family/patient expects to be discharged to:: Private residence Living Arrangements: Children(Pt's daughter) Available Help at Discharge: Family;Available  PRN/intermittently Type of Home: Mobile home Home Access: Stairs to enter Entrance Stairs-Rails: Right;Left;Can reach both Entrance Stairs-Number of Steps: 4 Home Layout: One level Home Equipment: None      Prior Function Level of Independence: Independent         Comments: Pt independent community ambulator (no AD); works full time as Theme park manager.  No fall history.  Independent with ADL's.     Hand Dominance        Extremity/Trunk Assessment   Upper Extremity Assessment Upper Extremity Assessment: Generalized weakness    Lower Extremity Assessment Lower Extremity Assessment: Generalized weakness    Cervical / Trunk Assessment Cervical / Trunk Assessment: Normal  Communication   Communication: No difficulties  Cognition Arousal/Alertness: Awake/alert Behavior During Therapy: WFL for tasks assessed/performed Overall Cognitive Status: Within Functional Limits for tasks assessed                                        General Comments General comments (skin integrity, edema, etc.): pt reporting one chest tube removed by Dr. Genevive Bi this morning; 1 chest tube remaining and was clamped.  Nursing cleared pt for participation in physical therapy.  Pt agreeable to PT session.    Exercises  Gait training   Assessment/Plan    PT Assessment Patient needs continued PT services  PT Problem List Decreased strength;Decreased activity tolerance;Decreased balance;Decreased mobility;Cardiopulmonary status limiting activity       PT Treatment Interventions DME instruction;Gait training;Stair training;Functional mobility training;Therapeutic activities;Therapeutic exercise;Balance training;Patient/family education    PT Goals (Current goals can be found in the Care Plan section)  Acute Rehab PT Goals Patient Stated Goal: to improve independence PT Goal Formulation: With patient Time For Goal Achievement: 03/24/18 Potential to Achieve Goals: Good    Frequency Min  2X/week   Barriers to discharge        Co-evaluation               AM-PAC PT "6 Clicks" Daily Activity  Outcome Measure Difficulty turning over in bed (including adjusting bedclothes, sheets and blankets)?: None Difficulty moving from lying on back to sitting on the side of the bed? : None Difficulty sitting down on and standing up from a chair with arms (e.g., wheelchair, bedside commode, etc,.)?: None Help needed moving to and from a bed to chair (including a wheelchair)?: None Help needed walking in hospital room?: None Help needed climbing 3-5 steps with a railing? : A Little 6 Click Score: 23    End of Session Equipment Utilized During Treatment: Gait belt(up high off of incision) Activity Tolerance: Patient tolerated treatment well Patient left: with call bell/phone within reach(sitting edge of bed) Nurse Communication: Mobility status;Precautions PT Visit Diagnosis: Muscle weakness (generalized) (M62.81);Difficulty in walking, not elsewhere classified (R26.2)    Time: 5027-7412 PT Time Calculation (min) (ACUTE ONLY): 24 min   Charges:   PT Evaluation $PT Re-evaluation: 1 Re-eval PT Treatments $Gait Training: 8-22 mins   PT G CodesLeitha Bleak, PT 03/10/18, 12:38 PM [phone]

## 2018-03-10 NOTE — Care Management Important Message (Signed)
Copy of signed IM left in patient's room.    

## 2018-03-11 ENCOUNTER — Inpatient Hospital Stay: Payer: BLUE CROSS/BLUE SHIELD

## 2018-03-11 LAB — CBC
HCT: 37.8 % — ABNORMAL LOW (ref 40.0–52.0)
Hemoglobin: 13.1 g/dL (ref 13.0–18.0)
MCH: 32.4 pg (ref 26.0–34.0)
MCHC: 34.8 g/dL (ref 32.0–36.0)
MCV: 93.2 fL (ref 80.0–100.0)
PLATELETS: 475 10*3/uL — AB (ref 150–440)
RBC: 4.05 MIL/uL — AB (ref 4.40–5.90)
RDW: 12.9 % (ref 11.5–14.5)
WBC: 11.7 10*3/uL — AB (ref 3.8–10.6)

## 2018-03-11 LAB — BASIC METABOLIC PANEL WITH GFR
Anion gap: 9 (ref 5–15)
BUN: 35 mg/dL — ABNORMAL HIGH (ref 6–20)
CO2: 29 mmol/L (ref 22–32)
Calcium: 9.6 mg/dL (ref 8.9–10.3)
Chloride: 99 mmol/L — ABNORMAL LOW (ref 101–111)
Creatinine, Ser: 1.14 mg/dL (ref 0.61–1.24)
GFR calc Af Amer: >60 mL/min
GFR calc non Af Amer: >60 mL/min
Glucose, Bld: 117 mg/dL — ABNORMAL HIGH (ref 65–99)
Potassium: 4.6 mmol/L (ref 3.5–5.1)
Sodium: 137 mmol/L (ref 135–145)

## 2018-03-11 MED ORDER — METOPROLOL SUCCINATE ER 50 MG PO TB24: 50.0000 mg | ORAL_TABLET | Freq: Every day | ORAL | 0 refills | Status: DC

## 2018-03-11 NOTE — Progress Notes (Signed)
Physical Therapy Treatment Patient Details Name: Joseph Barron MRN: [id number] DOB: 1951-02-14 Today's Date: 03/11/2018    History of Present Illness Pt is a 67 year old white male admitted 02/23/18 with an enlarging right upper lobe mass. 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 He was extensively evaluated and found to be a suitable candidate for surgery.  Pt is s/p R thoracotomy with wedge resection of right upper lobe mass.  Pt noted with enlarging pneumothorax and s/p 2nd chest tube placement 5/10 /19. PMH includes: borderline DM, cancer, GERD, HLD, HTN, and pulmonary nodules.    PT Comments    Pt modified independent with bed mobility; independent with transfers; SBA with ambulation around nursing loop (no AD); and CGA navigating 5 stairs with railing.  Pt's O2 sats 89-90% post ambulation/stairs on room air; nursing notified (O2 improved to 92% with sitting rest break).  No c/o pain.  No loss of balance with functional mobility.  Will continue to progress pt with increasing endurance/activity tolerance during hospitalization.    Follow Up Recommendations  Other (comment)(Follow-up with PCP for any further OP PT needs)     Equipment Recommendations  None recommended by PT    Recommendations for Other Services       Precautions / Restrictions Precautions Precautions: Fall Precaution Comments: R chest tube precautions Restrictions Weight Bearing Restrictions: No    Mobility  Bed Mobility Overal bed mobility: Modified Independent             General bed mobility comments: Semi-supine to/from sit without any noted difficulties.  Transfers Overall transfer level: Independent Equipment used: None Transfers: Sit to/from Stand Sit to Stand: Independent         General transfer comment: steady safe transfers no AD  Ambulation/Gait Ambulation/Gait assistance: Supervision Ambulation Distance (Feet): 240 Feet Assistive device: None Gait Pattern/deviations:  Step-through pattern   Gait velocity interpretation: <1.31 ft/sec, indicative of household ambulator General Gait Details: steady without loss of balance   Stairs Stairs: Yes Stairs assistance: Min guard Stair Management: One rail Right;Alternating pattern;Forwards Number of Stairs: 5 General stair comments: steady stairs navigation   Wheelchair Mobility    Modified Rankin (Stroke Patients Only)       Balance Overall balance assessment: Needs assistance Sitting-balance support: No upper extremity supported;Feet supported Sitting balance-Leahy Scale: Normal Sitting balance - Comments: steady sitting reaching outside BOS   Standing balance support: No upper extremity supported Standing balance-Leahy Scale: Normal Standing balance comment: steady standing reaching outside BOS                            Cognition Arousal/Alertness: Awake/alert Behavior During Therapy: WFL for tasks assessed/performed Overall Cognitive Status: Within Functional Limits for tasks assessed                                        Exercises      General Comments General comments (skin integrity, edema, etc.): R chest tube (clamped).  Nursing cleared pt for participation in physical therapy.  Pt agreeable to PT session.      Pertinent Vitals/Pain Pain Assessment: No/denies pain  HR WFL during session.    Home Living                      Prior Function            PT Goals (current goals  can now be found in the care plan section) Acute Rehab PT Goals Patient Stated Goal: to improve independence PT Goal Formulation: With patient Time For Goal Achievement: 03/24/18 Potential to Achieve Goals: Good Progress towards PT goals: Progressing toward goals    Frequency    Min 2X/week      PT Plan Current plan remains appropriate    Co-evaluation              AM-PAC PT "6 Clicks" Daily Activity  Outcome Measure  Difficulty turning over in  bed (including adjusting bedclothes, sheets and blankets)?: None Difficulty moving from lying on back to sitting on the side of the bed? : None Difficulty sitting down on and standing up from a chair with arms (e.g., wheelchair, bedside commode, etc,.)?: None Help needed moving to and from a bed to chair (including a wheelchair)?: None Help needed walking in hospital room?: None Help needed climbing 3-5 steps with a railing? : A Little 6 Click Score: 23    End of Session Equipment Utilized During Treatment: Gait belt(up high off of incision) Activity Tolerance: Patient tolerated treatment well Patient left: in bed;with call bell/phone within reach Nurse Communication: Mobility status;Precautions;Other (comment)(Pt's O2 sats during session) PT Visit Diagnosis: Muscle weakness (generalized) (M62.81);Difficulty in walking, not elsewhere classified (R26.2)     Time: 4010-2725 PT Time Calculation (min) (ACUTE ONLY): 8 min  Charges:  $Therapeutic Activity: 8-22 mins                    G CodesLeitha Bleak, PT 03/11/18, 4:07 PM [phone]

## 2018-03-11 NOTE — Progress Notes (Addendum)
Discharge instructions reviewed with the patient and his daughter.  Patient being sent out via wheelchair

## 2018-03-11 NOTE — Progress Notes (Signed)
Back to room. Barbaraann Faster, RN 7:10 AM 03/11/2018

## 2018-03-11 NOTE — Progress Notes (Signed)
Patient ID: Joseph Barron, male   DOB: Jun 21, 1951, 67 y.o.   MRN: [id number]  [facility] PROGRESS NOTE  BARDIA WANGERIN YHC:[id number] DOB: Dec 29, 1950 DOA: 02/23/2018 PCP: Dion Body, MD  HPI/Subjective:   Objective: Vitals:   03/11/18 1214 03/11/18 1216  BP: (!) 168/67 (!) 145/77  Pulse: 71 72  Resp: 15   Temp: 98.2 F (36.8 C)   SpO2: 97%     Filed Weights   02/23/18 1708 03/08/18 1000  Weight: 71.9 kg (158 lb 8.2 oz) 67.3 kg (148 lb 6.4 oz)    ROS: Review of Systems  Constitutional: Negative for chills and fever.  Eyes: Negative for blurred vision.  Respiratory: Positive for cough and shortness of breath.   Cardiovascular: Negative for chest pain.  Gastrointestinal: Negative for abdominal pain, constipation, diarrhea, nausea and vomiting.  Genitourinary: Negative for dysuria.  Musculoskeletal: Negative for joint pain.  Neurological: Negative for dizziness and headaches.   Exam: Physical Exam  Constitutional: He is oriented to person, place, and time.  HENT:  Nose: No mucosal edema.  Mouth/Throat: No oropharyngeal exudate or posterior oropharyngeal edema.  Eyes: Pupils are equal, round, and reactive to light. Conjunctivae, EOM and lids are normal.  Neck: No JVD present. Carotid bruit is not present. No edema present. No thyroid mass and no thyromegaly present.  Cardiovascular: S1 normal and S2 normal. Exam reveals no gallop.  No murmur heard. Pulses:      Dorsalis pedis pulses are 2+ on the right side, and 2+ on the left side.  Respiratory: No respiratory distress. He has decreased breath sounds in the right lower field and the left lower field. He has no wheezes. He has no rhonchi. He has no rales.  GI: Soft. Bowel sounds are normal. There is no tenderness.  Musculoskeletal:       Right ankle: He exhibits no swelling.       Left ankle: He exhibits no swelling.  Lymphadenopathy:    He has no cervical adenopathy.  Neurological: He is alert and  oriented to person, place, and time. No cranial nerve deficit.  Skin: Skin is warm. No rash noted. Nails show no clubbing.  Psychiatric: He has a normal mood and affect.      Data Reviewed: Basic Metabolic Panel: Recent Labs  Lab 03/08/18 1033 03/09/18 1119 03/09/18 2158 03/10/18 0414 03/11/18 0413  NA 134*  --   --  138 137  K 5.7* 6.0* 5.5* 4.9 4.6  CL 97*  --   --  101 99*  CO2 30  --   --  30 29  GLUCOSE 108*  --   --  104* 117*  BUN 60*  --   --  49* 35*  CREATININE 1.52*  --   --  1.31* 1.14  CALCIUM 9.6  --   --  9.5 9.6   Liver Function Tests: Recent Labs  Lab 03/10/18 0414  AST 53*  ALT 74*  ALKPHOS 134*  BILITOT 0.5  PROT 7.3  ALBUMIN 3.1*   CBC: Recent Labs  Lab 03/08/18 1033 03/10/18 0414 03/11/18 0413  WBC 11.7* 13.3* 11.7*  HGB 13.1 12.8* 13.1  HCT 39.4* 38.2* 37.8*  MCV 93.4 94.4 93.2  PLT 439 453* 475*      Studies: Dg Chest 2 View  Result Date: 03/11/2018 CLINICAL DATA:  Postop check; chest tube in place. Hx of HTN. Former smoker. EXAM: CHEST - 2 VIEW COMPARISON:  03/11/2018 FINDINGS: Patient has a RIGHT-sided chest tube, tip  at the RIGHT lung apex. There is small RIGHT basilar pneumothorax. Pleural thickening noted on the RIGHT, stable in appearance. There is minimal RIGHT basilar opacity. There is perihilar peribronchial thickening bilaterally. Minimal LEFT LOWER lobe atelectasis. IMPRESSION: 1. Small RIGHT basilar pneumothorax. 2. Stable bibasilar changes and bronchitic changes. Electronically Signed   By: Nolon Nations M.D.   On: 03/11/2018 14:12   Dg Chest 2 View  Result Date: 03/11/2018 CLINICAL DATA:  Chest tube, postoperative EXAM: CHEST - 2 VIEW COMPARISON:  03/10/2018 FINDINGS: RIGHT thoracostomy tube unchanged. Pigtail thoracostomy tube at RIGHT base on previous exam has been removed. Stable heart size and mediastinal contours. Persistent pleural thickening or effusion at the lateral RIGHT chest unchanged. Minimal RIGHT  subpulmonic pneumothorax. RIGHT basilar atelectasis. LEFT lung remains clear. No acute osseous findings. IMPRESSION: Stable RIGHT thoracostomy tube with interval removal of an additional pigtail catheter at the inferior RIGHT hemithorax. RIGHT basilar atelectasis with minimal subpulmonic pneumothorax. Electronically Signed   By: Lavonia Dana M.D.   On: 03/11/2018 07:43   Dg Chest 2 View  Result Date: 03/10/2018 CLINICAL DATA:  Postop chest tube evaluation, history of air leak post thoracotomy and right upper lobe wedge resection on 02/23/2017 EXAM: CHEST - 2 VIEW COMPARISON:  Chest x-ray of 03/09/2018 and CT chest of 03/04/2017 FINDINGS: There is no change in volume of the right lateral pneumothorax with 2 right chest tubes remaining. Surgical staples overlie the right lower hemithorax. Mild basilar atelectasis remains bilaterally. Heart size is stable IMPRESSION: 1. No change in small right pneumothorax with 2 right chest tubes remaining. 2. No change in bibasilar linear atelectasis. Electronically Signed   By: Ivar Drape M.D.   On: 03/10/2018 09:57    Scheduled Meds: . atorvastatin  10 mg Oral QHS  . bisacodyl  10 mg Oral Daily  . enoxaparin (LOVENOX) injection  40 mg Subcutaneous Q24H  . feeding supplement (ENSURE ENLIVE)  237 mL Oral TID BM  . lidocaine  10 mL Intrapleural Once  . metoprolol succinate  50 mg Oral QHS  . pantoprazole  40 mg Oral Daily   Continuous Infusions:  Assessment/Plan:  1. Essential hypertension.  Toprol increased to 50 mg for better blood pressure control. 2. Hyperkalemia trended better.  Continue to hold ACE inhibitor.  I would discontinue lisinopril. 3. Right-sided pneumothorax.  Chest tube as per cardiothoracic surgery 4. Right upper lobe neuroendocrine tumor.  Follow-up with Dr. Tasia Catchings oncology as outpatient. 5. Hyperlipidemia unspecified on atorvastatin 6. GERD on metoprolol 7. Impaired fasting glucose.  Last hemoglobin A1c back in October was 6.2.  That does  not qualify him for diabetes at this point.  6  Code Status:     Code Status Orders  (From admission, onward)        Start     Ordered   02/23/18 1713  Full code  Continuous     02/23/18 1712    Code Status History    This patient has a current code status but no historical code status.     Disposition Plan: As per surgical team   Time spent: 28 minutes  [place]

## 2018-03-11 NOTE — Progress Notes (Signed)
Down to Radiology via bed. Chest tube clamped per Dr. Genevive Bi.Barbaraann Faster, RN 6:45 AM 03/11/2018

## 2018-03-11 NOTE — Progress Notes (Signed)
Patient will be released soon and he spoke of his gratitude for his doctors, nurses, and God's presence. Patient outlined plans after release, including his desire to return to work. Chaplain offered active listening, reflective questions, and emotional assurance.

## 2018-03-12 ENCOUNTER — Other Ambulatory Visit: Payer: Self-pay

## 2018-03-12 DIAGNOSIS — J9383 Other pneumothorax: Secondary | ICD-10-CM

## 2018-03-15 ENCOUNTER — Ambulatory Visit (INDEPENDENT_AMBULATORY_CARE_PROVIDER_SITE_OTHER): Payer: BLUE CROSS/BLUE SHIELD | Admitting: Cardiothoracic Surgery

## 2018-03-15 ENCOUNTER — Encounter: Payer: Self-pay | Admitting: Cardiothoracic Surgery

## 2018-03-15 ENCOUNTER — Ambulatory Visit
Admission: RE | Admit: 2018-03-15 | Discharge: 2018-03-15 | Disposition: A | Discharge status: Home / Self Care | Payer: BLUE CROSS/BLUE SHIELD | Source: Ambulatory Visit | Attending: Cardiothoracic Surgery | Admitting: Cardiothoracic Surgery

## 2018-03-15 VITALS — BP 195/89 | HR 68 | Temp 97.7°F | Wt 154.0 lb

## 2018-03-15 DIAGNOSIS — R918 Other nonspecific abnormal finding of lung field: Secondary | ICD-10-CM | POA: Diagnosis not present

## 2018-03-15 DIAGNOSIS — J9383 Other pneumothorax: Secondary | ICD-10-CM | POA: Insufficient documentation

## 2018-03-15 DIAGNOSIS — J984 Other disorders of lung: Secondary | ICD-10-CM | POA: Diagnosis not present

## 2018-03-15 NOTE — Discharge Summary (Signed)
Physician Discharge Summary  Patient ID: EINAR NOLASCO MRN: [id number] DOB/AGE: October 02, 1951 67 y.o.  Admit date: 02/23/2018 Discharge date: 03/15/2018   Discharge Diagnoses:  Active Problems:   Bronchogenic cancer of right lung (HCC)   Malnutrition of moderate degree   Procedures: Right thoracotomy and wedge resection right upper lobe mass  Hospital Course: This patient is a 67 year old gentleman who has severe emphysema and was diagnosed with a right upper lobe mass.  A percutaneous biopsy was positive for malignancy and he was offered a right thoracotomy and wedge resection for definitive management.  He was taken to the operating room where this was performed without incident.  The lesion in the lung was excised using a wedge technique.  The final diagnosis did show a non-small cell carcinoma with negative margins.  The patient's hospital course was complicated by prolonged air leak and ultimately he was scheduled to undergo a talc pleurodesis.  However his air leak gradually resolved and ultimately stopped and his tubes were then removed prior to discharge.  At the time of discharge she had a small lateral pneumothorax that was stable.  His wounds were all healing as expected.  His sutures and staples have been removed.  He will be seen in the office within 1 week for chest x-ray.  Disposition: Home  Discharge Instructions    Call MD for:  difficulty breathing, headache or visual disturbances   Complete by:  As directed    Call MD for:  redness, tenderness, or signs of infection (pain, swelling, redness, odor or green/yellow discharge around incision site)   Complete by:  As directed    Call MD for:  severe uncontrolled pain   Complete by:  As directed    Call MD for:  temperature >100.4   Complete by:  As directed    Diet - low sodium heart healthy   Complete by:  As directed    Increase activity slowly   Complete by:  As directed      Allergies as of 03/11/2018   No Known  Allergies     Medication List    STOP taking these medications   lisinopril 20 MG tablet Commonly known as:  PRINIVIL,ZESTRIL     TAKE these medications   atorvastatin 10 MG tablet Commonly known as:  LIPITOR Take 10 mg by mouth at bedtime.   metoprolol succinate 50 MG 24 hr tablet Commonly known as:  TOPROL-XL Take 1 tablet (50 mg total) by mouth at bedtime. Take with or immediately following a meal. What changed:    medication strength  how much to take  additional instructions   omeprazole 20 MG capsule Commonly known as:  PRILOSEC Take 40 mg by mouth daily.      Follow-up Information    Nestor Lewandowsky, MD. Go on 03/15/2018.   Specialties:  Cardiothoracic Surgery, General Surgery Why:  Dr. Genevive Bi, Monday, 5/20 at 11 a.m.  [phone] Contact information: [address] [place] [address] [phone]           Nestor Lewandowsky, MD

## 2018-03-15 NOTE — Progress Notes (Signed)
Overall he thinks he is doing quite a bit better than when he was in the hospital.  He was able to walk quite a distance today to come into the hospital and has no complaints of shortness of breath.  He denied any fevers or chills at home.  His lungs show diminished breath sounds on the right but otherwise normal.  There is a small amount of eschar around the chest tube site.  I removed his chest tube suture and debrided the wound slightly.  His heart rate is regular.  There are no murmurs.  I have independently reviewed his chest x-ray.  It shows no difference from when he was in the hospital last week.  There still remains a lateral pneumothorax which is unchanged.  He is scheduled to follow-up with oncology in 2 weeks.  We will see him at that time as well.

## 2018-03-15 NOTE — Patient Instructions (Signed)
Please continue to wash the incisions with soap and water. Then pat dry and apply a new bandage.

## 2018-03-25 ENCOUNTER — Encounter: Payer: Self-pay | Admitting: Cardiothoracic Surgery

## 2018-03-25 ENCOUNTER — Other Ambulatory Visit: Payer: Self-pay | Admitting: Cardiothoracic Surgery

## 2018-03-25 ENCOUNTER — Ambulatory Visit
Admission: RE | Admit: 2018-03-25 | Discharge: 2018-03-25 | Disposition: A | Discharge status: Home / Self Care | Payer: BLUE CROSS/BLUE SHIELD | Source: Ambulatory Visit | Attending: Cardiothoracic Surgery | Admitting: Cardiothoracic Surgery

## 2018-03-25 ENCOUNTER — Ambulatory Visit (INDEPENDENT_AMBULATORY_CARE_PROVIDER_SITE_OTHER): Payer: BLUE CROSS/BLUE SHIELD | Admitting: Cardiothoracic Surgery

## 2018-03-25 DIAGNOSIS — R918 Other nonspecific abnormal finding of lung field: Secondary | ICD-10-CM | POA: Insufficient documentation

## 2018-03-25 DIAGNOSIS — Z9889 Other specified postprocedural states: Secondary | ICD-10-CM | POA: Diagnosis not present

## 2018-03-25 NOTE — Progress Notes (Signed)
  Patient ID: Joseph Barron, male   DOB: 1951/09/01, 67 y.o.   MRN: [id number]  HISTORY: He returns today in follow-up.  He states that he has no discomfort from his thoracotomy incision.  He is not short of breath.  His wounds have all healed and he is no longer putting any dressings on.  He would like to go in the pool and also would like to begin driving.   There were no vitals filed for this visit.   EXAM:    Resp: Lungs are clear bilaterally except for bibasilar rales..  No respiratory distress, normal effort. Heart:  Regular without murmurs Abd:  Abdomen is soft, non distended and non tender. No masses are palpable.  There is no rebound and no guarding.  Neurological: Alert and oriented to person, place, and time. Coordination normal.  Skin: Skin is warm and dry. No rash noted. No diaphoretic. No erythema. No pallor.  Psychiatric: Normal mood and affect. Normal behavior. Judgment and thought content normal.    ASSESSMENT: He did have a chest x-ray made today.  I see no change from the prior films.  There are some postoperative changes in the right hemithorax.   PLAN:   He is scheduled to follow-up with oncology tomorrow.  Once he has established follow-up with them we will discharge from my clinic.  He has no acute surgical issues.  He does not desire any pain medications.  We did get some information for disability which we will fill out.    Nestor Lewandowsky, MD

## 2018-03-26 ENCOUNTER — Encounter: Payer: Self-pay | Admitting: Cardiothoracic Surgery

## 2018-03-26 ENCOUNTER — Encounter: Payer: Self-pay | Admitting: Oncology

## 2018-03-26 ENCOUNTER — Other Ambulatory Visit: Payer: Self-pay

## 2018-03-26 ENCOUNTER — Inpatient Hospital Stay: Payer: BLUE CROSS/BLUE SHIELD | Attending: Oncology | Admitting: Oncology

## 2018-03-26 VITALS — BP 157/82 | HR 67 | Temp 97.2°F | Resp 18 | Wt 155.0 lb

## 2018-03-26 DIAGNOSIS — E785 Hyperlipidemia, unspecified: Secondary | ICD-10-CM | POA: Diagnosis not present

## 2018-03-26 DIAGNOSIS — K219 Gastro-esophageal reflux disease without esophagitis: Secondary | ICD-10-CM | POA: Diagnosis not present

## 2018-03-26 DIAGNOSIS — Z77090 Contact with and (suspected) exposure to asbestos: Secondary | ICD-10-CM | POA: Diagnosis not present

## 2018-03-26 DIAGNOSIS — J439 Emphysema, unspecified: Secondary | ICD-10-CM | POA: Insufficient documentation

## 2018-03-26 DIAGNOSIS — I700 Atherosclerosis of aorta: Secondary | ICD-10-CM | POA: Insufficient documentation

## 2018-03-26 DIAGNOSIS — C7A8 Other malignant neuroendocrine tumors: Secondary | ICD-10-CM | POA: Insufficient documentation

## 2018-03-26 DIAGNOSIS — Z79899 Other long term (current) drug therapy: Secondary | ICD-10-CM

## 2018-03-26 DIAGNOSIS — Z7189 Other specified counseling: Secondary | ICD-10-CM

## 2018-03-26 DIAGNOSIS — R0602 Shortness of breath: Secondary | ICD-10-CM | POA: Diagnosis not present

## 2018-03-26 DIAGNOSIS — Z800 Family history of malignant neoplasm of digestive organs: Secondary | ICD-10-CM | POA: Diagnosis not present

## 2018-03-26 DIAGNOSIS — Z86010 Personal history of colonic polyps: Secondary | ICD-10-CM | POA: Diagnosis not present

## 2018-03-26 DIAGNOSIS — I10 Essential (primary) hypertension: Secondary | ICD-10-CM | POA: Diagnosis not present

## 2018-03-26 DIAGNOSIS — Z87891 Personal history of nicotine dependence: Secondary | ICD-10-CM

## 2018-03-26 NOTE — Progress Notes (Signed)
Patient here today for follow up.   

## 2018-03-26 NOTE — Telephone Encounter (Signed)
Yong Channel is calling in regards to patient's disability paperwork and needs someone to call him has some questions about endorsing patient's checks he can be reached at [phone] ext [phone]. Please call and advise.

## 2018-03-27 NOTE — Progress Notes (Signed)
Hematology/Oncology Follow up note [facility] Telephone:(336) [phone] Fax:([phone]) [phone]   Patient Care Team: Dion Body, MD as PCP - General (Family Medicine) Telford Nab, RN as Registered Nurse  REFERRING PROVIDER: Dion Body, MD  Lung [place] VISIT Follow up for lung adenocarcinoma.  HISTORY OF PRESENTING ILLNESS:  Joseph Barron is a  67 y.o.  male with PMH listed below who was referred to me for evaluation of lung mass.  Patient had CT chest lung cancer screening done on January 12, 2018 which showed there is a spiculated nodule with a volume derived mean diameter of 15.2 mm, which abuts the pleural surface, highly concerning for primary bronchogenic neoplasm.  He is former smoker, quit recently. 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  Reports mild shortness of breath with climbing stairs otherwise feeling well.  He has a chronic cough productive with clear sputum.  Denies any weight loss, headache, abdominal pain, lower extremity swelling.  Today he is accompanied by his daughter to clinic.  Patient's case was discussed on tumor conference on January 14, 2018.  Given his heavy smoking history/COPD/emphysema, risks excised for biopsy. Consensus decision was to obtain a PET scan, pulmonary lung function testing.  If his lung mass is hypermetabolic on PET scan and no other involvement, he will have surgical evaluation regarding possibilities of resection as well as obtaining diagnosis.  # PET scan  1. 15.2 mm nodule of concern on recent lung cancer screening CT shows no appreciable change in the 8 day interval since that exam. As such, low level hypermetabolism on today's PET study is more likely related to neoplastic etiology than infection/inflammation. No evidence for hypermetabolic hilar or mediastinal lymph nodes. 2. Focal hypermetabolic  FDG accumulation in the distal sigmoid colon with really no marked FDG uptake in the colon elsewhere. CT imaging shows an apparent focal soft tissue lesion at this location. Advanced adenoma or neoplasm could have this appearance. Correlation with colorectal cancer screening history recommended and colonoscopy may be warranted. 3.  Emphysema. (ICD10-J43.9) 4.  Aortic Atherosclerois (ICD10-170.0)  # Biopsy was done at [place]  On 02/03/2018.  A.Right Lung, Fine Needle Aspiration II (smears and cell block):  Non-small cell carcinoma with histologic changes and immunohistochemical profile consistent with lung adenocarcinoma, moderately differentiated.   Specimen Adequacy:Satisfactory for evaluation.        Note: Floaters identified on cell block.  B.Cytology core biopsy:  Non-small cell carcinoma with histologic changes and  immunohistochemical profile consistent with lung adenocarcinoma,  moderately differentiated.     INTERVAL HISTORY Joseph Barron is a 67 y.o. male who has above history reviewed by me today presents for  management of large cell neuroendocrine carcinoma of lung.  During the interval , on 02/23/2018 patient is s/p right thoracotomy and wedge resection. Post surgical course complicated with prolong air leak/pnemothorax. He was discharged two weeks ago.  Today he presents to discuss about pathology results and management of lung cancer.  He reports feeling ok today. Mild fatigue. Denies any chest pain, SOB. Appetite is not good.   Review of Systems  Constitutional: Positive for malaise/fatigue. Negative for chills, fever and weight loss.  HENT: Negative for congestion, ear discharge, ear pain, nosebleeds, sinus pain and sore throat.   Eyes: Negative for double vision, photophobia, pain, discharge and redness.  Respiratory: Negative for cough, hemoptysis, sputum production, shortness of  breath and wheezing.   Cardiovascular: Negative for chest  pain, palpitations, orthopnea, claudication and leg swelling.  Gastrointestinal: Negative for abdominal pain, blood in stool, constipation, diarrhea, heartburn, melena, nausea and vomiting.  Genitourinary: Negative for dysuria, flank pain, frequency and hematuria.  Musculoskeletal: Negative for back pain, myalgias and neck pain.  Skin: Negative for itching and rash.  Neurological: Negative for dizziness, tingling, tremors, sensory change, focal weakness, weakness and headaches.  Endo/Heme/Allergies: Negative for environmental allergies. Does not bruise/bleed easily.  Psychiatric/Behavioral: Negative for depression and hallucinations. The patient is not nervous/anxious.     MEDICAL HISTORY:  Past Medical History:  Diagnosis Date  . 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 Cancer ([place])    lung  . GERD (gastroesophageal reflux disease)   . Hyperlipemia   . Hypertension   . Pulmonary nodules 01/13/2018  . Vaccine counseling 12/22/2017    SURGICAL HISTORY: Past Surgical History:  Procedure Laterality Date  . COLONOSCOPY WITH PROPOFOL N/A 02/22/2018   Procedure: COLONOSCOPY WITH PROPOFOL;  Surgeon: Manya Silvas, MD;  Location: [facility] ENDOSCOPY;  Service: Endoscopy;  Laterality: N/A;  . EYE SURGERY Left child  . THORACOTOMY/LOBECTOMY Right 02/23/2018   Procedure: THORACOTOMY/ WEDGE RESECTION;  Surgeon: Nestor Lewandowsky, MD;  Location: [facility] ORS;  Service: Thoracic;  Laterality: Right;  Marland Kitchen VIDEO BRONCHOSCOPY N/A 02/23/2018   Procedure: PREOP BRONCHOSCOPY;  Surgeon: Nestor Lewandowsky, MD;  Location: [facility] ORS;  Service: Thoracic;  Laterality: N/A;    SOCIAL HISTORY: Social History   Socioeconomic History  . Marital status: Married    Spouse name: Not on file  . Number of children: Not on file  . Years of education: Not on file  . Highest education level: Not on file  Occupational History  . Not on file  Social Needs  . Financial resource strain: Not on file  . Food insecurity:    Worry:  Not on file    Inability: Not on file  . Transportation needs:    Medical: Not on file    Non-medical: Not on file  Tobacco Use  . Smoking status: Former Smoker    Packs/day: 1.00    Years: 50.00    Pack years: 50.00    Types: Cigarettes    Last attempt to quit: 11/17/2017    Years since quitting: 0.3  . Smokeless tobacco: Former Systems developer    Types: [place] date: 01/15/1989  Substance and Sexual Activity  . Alcohol use: Yes    Alcohol/week: 5.4 oz    Types: 7 Cans of beer, 2 Shots of liquor per week  . Drug use: Never  . Sexual activity: Yes  Lifestyle  . Physical activity:    Days per week: Not on file    Minutes per session: Not on file  . Stress: Not on file  Relationships  . Social connections:    Talks on phone: Not on file    Gets together: Not on file    Attends religious service: Not on file    Active member of club or organization: Not on file    Attends meetings of clubs or organizations: Not on file    Relationship status: Not on file  . Intimate partner violence:    Fear of current or ex partner: Not on file    Emotionally abused: Not on file    Physically abused: Not on file    Forced sexual activity: Not on file  Other Topics Concern  . Not on file  Social  History Narrative  . Not on file    FAMILY HISTORY: Family History  Problem Relation Age of Onset  . Arthritis Mother   . COPD Father   . Coronary artery disease Father   . Transient ischemic attack Father   . Alcoholism Father   . Bowel Disease Father   . COPD Sister   . Prostatitis Brother   . Thyroid disease Sister   . Thyroid disease Sister   . Liver cancer Brother   . Diabetes Brother     ALLERGIES:  has No Known Allergies.  MEDICATIONS:  Current Outpatient Medications  Medication Sig Dispense Refill  . atorvastatin (LIPITOR) 10 MG tablet Take 10 mg by mouth at bedtime.     . metoprolol succinate (TOPROL-XL) 50 MG 24 hr tablet Take 1 tablet (50 mg total) by mouth at bedtime. Take  with or immediately following a meal. 30 tablet 0  . omeprazole (PRILOSEC) 20 MG capsule Take 40 mg by mouth daily.      No current facility-administered medications for this visit.      PHYSICAL EXAMINATION: ECOG PERFORMANCE STATUS: 1 - Symptomatic but completely ambulatory Vitals:   03/26/18 1349  BP: (!) 157/82  Pulse: 67  Resp: 18  Temp: (!) 97.2 F (36.2 C)  SpO2: 95%   Filed Weights   03/26/18 1349  Weight: 155 lb (70.3 kg)    Physical Exam  Constitutional: He is oriented to person, place, and time and well-developed, well-nourished, and in no distress. No distress.  HENT:  Head: Normocephalic and atraumatic.  Nose: Nose normal.  Mouth/Throat: Oropharynx is clear and moist. No oropharyngeal exudate.  Eyes: Pupils are equal, round, and reactive to light. EOM are normal. Right eye exhibits no discharge. Left eye exhibits no discharge. No scleral icterus.  Neck: Normal range of motion. Neck supple. No JVD present.  Cardiovascular: Normal rate, regular rhythm and normal heart sounds.  No murmur heard. Pulmonary/Chest: Effort normal. No respiratory distress. He has no wheezes. He has no rales.  Decreased breath sound bilaterally. Right posterior chest wall surgical scar well healed.   Abdominal: Soft. Bowel sounds are normal. He exhibits no distension and no mass. There is no tenderness. There is no rebound.  Musculoskeletal: Normal range of motion. He exhibits no edema or tenderness.  Lymphadenopathy:    He has no cervical adenopathy.  Neurological: He is alert and oriented to person, place, and time. No cranial nerve deficit. He exhibits normal muscle tone. Coordination normal.  Skin: Skin is warm and dry. No rash noted. He is not diaphoretic. No erythema.  Psychiatric: Affect and judgment normal.     LABORATORY DATA:  I have reviewed the data as listed Lab Results  Component Value Date   WBC 11.7 (H) 03/11/2018   HGB 13.1 03/11/2018   HCT 37.8 (L) 03/11/2018    MCV 93.2 03/11/2018   PLT 475 (H) 03/11/2018   Recent Labs    02/18/18 1449  03/08/18 1033  03/09/18 2158 03/10/18 0414 03/11/18 0413  NA 139   < > 134*  --   --  138 137  K 3.8   < > 5.7*   < > 5.5* 4.9 4.6  CL 106   < > 97*  --   --  101 99*  CO2 26   < > 30  --   --  30 29  GLUCOSE 130*   < > 108*  --   --  104* 117*  BUN 12   < >  60*  --   --  49* 35*  CREATININE 1.29*   < > 1.52*  --   --  1.31* 1.14  CALCIUM 9.0   < > 9.6  --   --  9.5 9.6  GFRNONAA 55*   < > 45*  --   --  54* >60  GFRAA >60   < > 53*  --   --  >60 >60  PROT 7.5  --   --   --   --  7.3  --   ALBUMIN 3.7  --   --   --   --  3.1*  --   AST 30  --   --   --   --  53*  --   ALT 26  --   --   --   --  74*  --   ALKPHOS 87  --   --   --   --  134*  --   BILITOT 0.5  --   --   --   --  0.5  --    < > = values in this interval not displayed.   Surgical Pathology  CASE: [id number]  PATIENT: Marjorie Heizer  Surgical Pathology Report  DIAGNOSIS:  A. LUNG, RIGHT UPPER LOBE; WEDGE RESECTION:  - LARGE CELL NEUROENDOCRINE CARCINOMA.  - SEE CANCER SUMMARY BELOW.  - CHANGE CONSISTENT WITH PRIOR FNA.  - REACTIVE SUBPLEURAL SMOOTH MUSCLE HYPERPLASIA.   B. PLEURA, RIGHT; BIOPSY:  - HYALINE PLEURAL PLAQUE WITH FOCAL REACTIVE MESOTHELIAL HYPERPLASIA.  - NEGATIVE FOR MALIGNANCY.     ASSESSMENT & PLAN:  1. Large cell neuroendocrine carcinoma ([place])   2. Goals of care, counseling/discussion    # Pathology discussed with patient. Discussed with him that large cell neuroendocrine carcinoma usually has worse prognosis than other non small cell lung cancer. Recurrence rate is high.  Due to the rarity of this particular histology, there is no evidence from prospective clinical trial for adjuvant chemotherapy. In general, I would offer adjuvant Carbo and Etoposide if patient has good performance status. Discuss with patient and he declined chemotherapy He is also not interested in radiation and declined being referred to  Radonc.   Recommend repeat CT scan in 3 months for surveillance. Patient voices understanding and agrees with the plan.   # Goal of care: Curative intent, discussed with patient.  # Sigmoid colon abnormal hypermetabolic activity on PET scan: He has had a colonoscopy and multiple polyps were resected. No malignancy.   All questions were answered. The patient knows to call the clinic with any problems questions or concerns.  Return of visit: 3 months after CT scan.   Earlie Server, MD, PhD Hematology Oncology [facility] Pager- [phone] 03/25/2018

## 2018-03-29 ENCOUNTER — Telehealth: Payer: Self-pay | Admitting: Cardiothoracic Surgery

## 2018-03-29 NOTE — Telephone Encounter (Signed)
[employer] disability form was filled out and then faxed to [phone].

## 2018-03-29 NOTE — Telephone Encounter (Signed)
Called patient back and he stated that when he was here last, Dr. Genevive Bi had told him to call him after her saw Dr. Tasia Catchings. Therefore, patient called stating that at the end of the visit, he and Dr. Tasia Catchings agreed on his decision on doing chemotherapy and/or radiation. He is to go back to see Dr. Tasia Catchings in August with a CT. I told him that I would let Dr. Genevive Bi know. Patient had no further questions.

## 2018-03-29 NOTE — Telephone Encounter (Signed)
Patient called said he needed to give the nurse some information about what Dr. Tasia Catchings told him. Please call patient and advise.

## 2018-04-02 ENCOUNTER — Telehealth: Payer: Self-pay

## 2018-04-02 ENCOUNTER — Other Ambulatory Visit: Payer: Self-pay | Admitting: Cardiothoracic Surgery

## 2018-04-02 NOTE — Telephone Encounter (Signed)
[employer] insurance paperwork completed and faxed at this time. Scanned and placed in folder.

## 2018-04-26 ENCOUNTER — Telehealth: Payer: Self-pay

## 2018-04-26 DIAGNOSIS — I2510 Atherosclerotic heart disease of native coronary artery without angina pectoris: Secondary | ICD-10-CM | POA: Insufficient documentation

## 2018-04-26 NOTE — Telephone Encounter (Signed)
Disability paperwork completed and faxed to [employer] at this time. [phone]. Patient notified.  Placed in scan folder.

## 2018-05-14 ENCOUNTER — Telehealth: Payer: Self-pay | Admitting: Cardiothoracic Surgery

## 2018-05-14 NOTE — Telephone Encounter (Signed)
Spoke with patient to let him know referral will be sent  and someone will call within 5-7 days to schedule an appointment. Patient instructed to call if he does not hear from anyone within the time frame listed above.

## 2018-05-14 NOTE — Telephone Encounter (Signed)
Patients daughter is calling said she had spoke with Dr. Genevive Bi and said he wants to refer the Joseph Barron to a lung specialist. Joseph Barron's daughter also said he is having a lot of issues with breathing and hurting in his right arm. Please call patients daughter and advise.

## 2018-05-24 ENCOUNTER — Encounter: Payer: Self-pay | Admitting: Internal Medicine

## 2018-05-24 ENCOUNTER — Ambulatory Visit (INDEPENDENT_AMBULATORY_CARE_PROVIDER_SITE_OTHER): Payer: BLUE CROSS/BLUE SHIELD | Admitting: Internal Medicine

## 2018-05-24 VITALS — BP 108/70 | HR 107 | Resp 16 | Ht 69.0 in | Wt 159.0 lb

## 2018-05-24 DIAGNOSIS — J449 Chronic obstructive pulmonary disease, unspecified: Secondary | ICD-10-CM | POA: Diagnosis not present

## 2018-05-24 MED ORDER — FLUTICASONE-SALMETEROL 115-21 MCG/ACT IN AERO
2.0000 | INHALATION_SPRAY | Freq: Two times a day (BID) | RESPIRATORY_TRACT | 12 refills | Status: DC
Start: 2018-05-24 — End: 2018-09-30

## 2018-05-24 MED ORDER — ALBUTEROL SULFATE HFA 108 (90 BASE) MCG/ACT IN AERS: 2.0000 | INHALATION_SPRAY | Freq: Four times a day (QID) | RESPIRATORY_TRACT | 2 refills | Status: DC | PRN

## 2018-05-24 MED ORDER — TIOTROPIUM BROMIDE MONOHYDRATE 1.25 MCG/ACT IN AERS
2.0000 | INHALATION_SPRAY | Freq: Two times a day (BID) | RESPIRATORY_TRACT | 5 refills | Status: DC
Start: 2018-05-24 — End: 2018-09-30

## 2018-05-24 NOTE — Patient Instructions (Signed)
START ADVAIR STAT SPIRIVA RESPIMAT ALBUTEROL AS NEEDED      PATIENT MAY RETURN TO WORK September 1st, 2019

## 2018-05-24 NOTE — Progress Notes (Signed)
Name: Joseph Barron MRN: [id number] DOB: 1951/06/23     CONSULTATION DATE: 7.29.19 REFERRING MD : Genevive Bi  CHIEF COMPLAINT: SOB  STUDIES:   02/2018   CT chest Independently reviewed by Me B/l emphysema-bullous disease RUL mass    HISTORY OF PRESENT ILLNESS: 67 year old gentleman who has severe emphysema and was diagnosed with a right upper lobe mass.  A percutaneous biopsy was positive for malignancy and he is s/p  right thoracotomy and wedge resection for definitive management.  The patient's hospital course was complicated by prolonged air leak and ultimately he was scheduled to undergo a talc pleurodesis.  he was d/c without any issues  Since discharge-he has been having progressive DOE and SOB Can not do more than 2 flights of stairs(previously can do 4 flights of stairs) He works as Theme park manager Has intermittent cough and wheezing He has never been on inhalers   Patient is a long time smoker 1 ppd for 50 years Quit Feb 2019  He has been having progressive SOB and increased WOB since surgery His PFT's 12/2017 ratio 60% Fev1 78% Fef 25/75 33% Interpretation-moderate to severe obstructive airways disease  He has no signs of infection at this time   PAST MEDICAL HISTORY :   has a past medical history of Borderline diabetes mellitus (08/06/2017), Cancer ([place]), GERD (gastroesophageal reflux disease), Hyperlipemia, Hypertension, Pulmonary nodules (01/13/2018), and Vaccine counseling (12/22/2017).  has a past surgical history that includes Eye surgery (Left, child); Colonoscopy with propofol (N/A, 02/22/2018); Video bronchoscopy (N/A, 02/23/2018); and Thoracotomy/lobectomy (Right, 02/23/2018). Prior to Admission medications   Medication Sig Start Date End Date Taking? Authorizing Provider  atorvastatin (LIPITOR) 10 MG tablet Take 10 mg by mouth at bedtime.  01/13/18  Yes [provider]  lisinopril (PRINIVIL,ZESTRIL) 20 MG tablet Take 20 mg by mouth daily. 04/26/18  Yes  [provider]  omeprazole (PRILOSEC) 20 MG capsule Take 40 mg by mouth daily.  12/22/17 12/22/18 Yes [provider]   No Known Allergies  FAMILY HISTORY:  family history includes Alcoholism in his father; Arthritis in his mother; Bowel Disease in his father; COPD in his father and sister; Coronary artery disease in his father; Diabetes in his brother; Liver cancer in his brother; Prostatitis in his brother; Thyroid disease in his sister and sister; Transient ischemic attack in his father. SOCIAL HISTORY:  reports that he quit smoking about 6 months ago. His smoking use included cigarettes. He has a 50.00 pack-year smoking history. He quit smokeless tobacco use about 29 years ago. His smokeless tobacco use included chew. He reports that he drinks about 5.4 oz of alcohol per week. He reports that he does not use drugs.  REVIEW OF SYSTEMS:   Constitutional: Negative for fever, chills, weight loss, malaise/fatigue and diaphoresis.  HENT: Negative for hearing loss, ear pain, nosebleeds, congestion, sore throat, neck pain, tinnitus and ear discharge.   Eyes: Negative for blurred vision, double vision, photophobia, pain, discharge and redness.  Respiratory: + cough, -hemoptysis, -sputum production, +shortness of breath, +wheezing and -stridor.   Cardiovascular: Negative for chest pain, palpitations, orthopnea, claudication, leg swelling and PND.  Gastrointestinal: Negative for heartburn, nausea, vomiting, abdominal pain, diarrhea, constipation, blood in stool and melena.  Genitourinary: Negative for dysuria, urgency, frequency, hematuria and flank pain.  Musculoskeletal: Negative for myalgias, back pain, joint pain and falls.  Skin: Negative for itching and rash.  Neurological: Negative for dizziness, tingling, tremors, sensory change, speech change, focal weakness, seizures, loss of consciousness, weakness and headaches.  Endo/Heme/Allergies: Negative for environmental allergies and  polydipsia. Does not bruise/bleed easily.  ALL OTHER ROS ARE NEGATIVE  BP 108/70 (BP Location: Left Arm, Cuff Size: Normal)   Pulse (!) 107   Resp 16   Ht 5\' 9"  (1.753 m)   Wt 159 lb (72.1 kg)   SpO2 96%   BMI 23.48 kg/m    Physical Examination:   GENERAL:NAD, no fevers, chills, no weakness no fatigue HEAD: Normocephalic, atraumatic.  EYES: Pupils equal, round, reactive to light. Extraocular muscles intact. No scleral icterus.  MOUTH: Moist mucosal membrane.   EAR, NOSE, THROAT: Clear without exudates. No external lesions.  NECK: Supple. No thyromegaly. No nodules. No JVD.  PULMONARY:CTA B/L no wheezes, no crackles, no rhonchi CARDIOVASCULAR: S1 and S2. Regular rate and rhythm. No murmurs, rubs, or gallops. No edema.  GASTROINTESTINAL: Soft, nontender, nondistended. No masses. Positive bowel sounds.  MUSCULOSKELETAL: No swelling, clubbing, or edema. Range of motion full in all extremities.  NEUROLOGIC: Cranial nerves II through XII are intact. No gross focal neurological deficits.  SKIN: No ulceration, lesions, rashes, or cyanosis. Skin warm and dry. Turgor intact.  PSYCHIATRIC: Mood, affect within normal limits. The patient is awake, alert and oriented x 3. Insight, judgment intact.      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 start Advair  (inhaled steroid and long acting beta agonist) 2. Start Spiriva respimat 3. Start albuterol as needed 4.follow up oncology as recommended  Assess Resp status at next OV   Patient  satisfied with Plan of action and management. All questions answered  Corrin Parker, M.D.  [facility]  Medical Director [place] Director [facility] Cardio-Pulmonary Department

## 2018-05-25 ENCOUNTER — Telehealth: Payer: Self-pay | Admitting: Cardiothoracic Surgery

## 2018-05-25 NOTE — Telephone Encounter (Signed)
Patient has called and left a message on the voicemail. Patient stated that he had questions about when he can go back to work. Please call patient at [phone].

## 2018-05-25 NOTE — Telephone Encounter (Signed)
Called patient back and he wanted for Joseph Barron to know that he was seen by Joseph Barron as recommended from him. He stated that Joseph Barron recommended for him to follow up with his medical oncologist (Joseph Barron) and to follow up with Joseph Barron in three months. Patient stated that Joseph Barron gave him 3 prescriptions to start using inhalers. Patient also stated that Joseph Barron took him out of work for an additional month. I told patient that I would relay the message to Joseph Barron and if he had any additional recommendations, then we would give him a call. Patient had no further questions.

## 2018-06-25 ENCOUNTER — Ambulatory Visit
Admission: RE | Admit: 2018-06-25 | Discharge: 2018-06-25 | Disposition: A | Discharge status: Home / Self Care | Payer: BLUE CROSS/BLUE SHIELD | Source: Ambulatory Visit | Attending: Oncology | Admitting: Oncology

## 2018-06-25 DIAGNOSIS — I700 Atherosclerosis of aorta: Secondary | ICD-10-CM | POA: Insufficient documentation

## 2018-06-25 DIAGNOSIS — J439 Emphysema, unspecified: Secondary | ICD-10-CM | POA: Insufficient documentation

## 2018-06-25 DIAGNOSIS — C7A8 Other malignant neuroendocrine tumors: Secondary | ICD-10-CM | POA: Insufficient documentation

## 2018-06-25 HISTORY — DX: Malignant neoplasm of unspecified part of unspecified bronchus or lung: C34.90

## 2018-06-25 LAB — POCT I-STAT CREATININE: Creatinine, Ser: 1.2 mg/dL (ref 0.61–1.24)

## 2018-06-25 MED ORDER — IOPAMIDOL (ISOVUE-300) INJECTION 61%
100.0000 mL | Freq: Once | INTRAVENOUS | Status: AC | PRN
  Administered 2018-06-25: 100 mL via INTRAVENOUS

## 2018-07-01 ENCOUNTER — Other Ambulatory Visit: Payer: Self-pay

## 2018-07-01 ENCOUNTER — Encounter: Payer: Self-pay | Admitting: Oncology

## 2018-07-01 ENCOUNTER — Inpatient Hospital Stay: Payer: BLUE CROSS/BLUE SHIELD | Attending: Oncology

## 2018-07-01 ENCOUNTER — Inpatient Hospital Stay (HOSPITAL_BASED_OUTPATIENT_CLINIC_OR_DEPARTMENT_OTHER): Payer: BLUE CROSS/BLUE SHIELD | Admitting: Oncology

## 2018-07-01 VITALS — BP 158/89 | HR 94 | Temp 98.2°F | Resp 18 | Wt 156.8 lb

## 2018-07-01 DIAGNOSIS — R053 Chronic cough: Secondary | ICD-10-CM

## 2018-07-01 DIAGNOSIS — Z79899 Other long term (current) drug therapy: Secondary | ICD-10-CM | POA: Diagnosis not present

## 2018-07-01 DIAGNOSIS — Z800 Family history of malignant neoplasm of digestive organs: Secondary | ICD-10-CM

## 2018-07-01 DIAGNOSIS — D649 Anemia, unspecified: Secondary | ICD-10-CM

## 2018-07-01 DIAGNOSIS — I700 Atherosclerosis of aorta: Secondary | ICD-10-CM | POA: Insufficient documentation

## 2018-07-01 DIAGNOSIS — E785 Hyperlipidemia, unspecified: Secondary | ICD-10-CM | POA: Insufficient documentation

## 2018-07-01 DIAGNOSIS — K219 Gastro-esophageal reflux disease without esophagitis: Secondary | ICD-10-CM

## 2018-07-01 DIAGNOSIS — Z87891 Personal history of nicotine dependence: Secondary | ICD-10-CM | POA: Insufficient documentation

## 2018-07-01 DIAGNOSIS — I10 Essential (primary) hypertension: Secondary | ICD-10-CM | POA: Diagnosis not present

## 2018-07-01 DIAGNOSIS — J439 Emphysema, unspecified: Secondary | ICD-10-CM | POA: Diagnosis not present

## 2018-07-01 DIAGNOSIS — C7A8 Other malignant neuroendocrine tumors: Secondary | ICD-10-CM | POA: Insufficient documentation

## 2018-07-01 DIAGNOSIS — R0602 Shortness of breath: Secondary | ICD-10-CM

## 2018-07-01 DIAGNOSIS — J90 Pleural effusion, not elsewhere classified: Secondary | ICD-10-CM

## 2018-07-01 DIAGNOSIS — R05 Cough: Secondary | ICD-10-CM

## 2018-07-01 LAB — CBC WITH DIFFERENTIAL/PLATELET
BASOS ABS: 0.1 10*3/uL (ref 0–0.1)
Basophils Relative: 1 %
EOS ABS: 0.5 10*3/uL (ref 0–0.7)
EOS PCT: 6 %
HCT: 36.5 % — ABNORMAL LOW (ref 40.0–52.0)
Hemoglobin: 12.3 g/dL — ABNORMAL LOW (ref 13.0–18.0)
Lymphocytes Relative: 28 %
Lymphs Abs: 2.3 10*3/uL (ref 1.0–3.6)
MCH: 31.2 pg (ref 26.0–34.0)
MCHC: 33.6 g/dL (ref 32.0–36.0)
MCV: 92.7 fL (ref 80.0–100.0)
MONO ABS: 1.1 10*3/uL — AB (ref 0.2–1.0)
Monocytes Relative: 13 %
Neutro Abs: 4.3 10*3/uL (ref 1.4–6.5)
Neutrophils Relative %: 52 %
PLATELETS: 408 10*3/uL (ref 150–440)
RBC: 3.93 MIL/uL — AB (ref 4.40–5.90)
RDW: 13.2 % (ref 11.5–14.5)
WBC: 8.2 10*3/uL (ref 3.8–10.6)

## 2018-07-01 LAB — COMPREHENSIVE METABOLIC PANEL
ALBUMIN: 3.6 g/dL (ref 3.5–5.0)
ALK PHOS: 90 U/L (ref 38–126)
ALT: 27 U/L (ref 0–44)
ANION GAP: 8 (ref 5–15)
AST: 27 U/L (ref 15–41)
BILIRUBIN TOTAL: 0.3 mg/dL (ref 0.3–1.2)
BUN: 17 mg/dL (ref 8–23)
CALCIUM: 9.3 mg/dL (ref 8.9–10.3)
CO2: 23 mmol/L (ref 22–32)
Chloride: 107 mmol/L (ref 98–111)
Creatinine, Ser: 1.29 mg/dL — ABNORMAL HIGH (ref 0.61–1.24)
GFR calc non Af Amer: 55 mL/min — ABNORMAL LOW (ref >60)
Glucose, Bld: 126 mg/dL — ABNORMAL HIGH (ref 70–99)
POTASSIUM: 4.4 mmol/L (ref 3.5–5.1)
SODIUM: 138 mmol/L (ref 135–145)
TOTAL PROTEIN: 7.7 g/dL (ref 6.5–8.1)

## 2018-07-01 NOTE — Progress Notes (Signed)
Pt here for follow up. Patient states he has returned to work and he is having a hard time breathing. He wants a doctor note to allow him to draw disability. Right arm has been hurting since surgery and has constant tremor preventing him from signing papers.

## 2018-07-02 NOTE — Progress Notes (Signed)
Hematology/Oncology Follow up note Joseph Barron Telephone:([phone] [phone]) [phone]   Patient Care Team: Dion Body, MD as PCP - General (Family Medicine) Telford Nab, RN as Registered Nurse  REFERRING PROVIDER: Dion Body, MD  Lung [place] VISIT Follow up for lung adenocarcinoma.  HISTORY OF PRESENTING ILLNESS:  Joseph Barron is a  67 y.o.  male with PMH listed below who was referred to me for evaluation of lung mass.  Patient had CT chest lung cancer screening done on January 12, 2018 which showed there is a spiculated nodule with a volume derived mean diameter of 15.2 mm, which abuts the pleural surface, highly concerning for primary bronchogenic neoplasm.  He is former smoker, quit recently. 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  Reports mild shortness of breath with climbing stairs otherwise feeling well.  He has a chronic cough productive with clear sputum.  Denies any weight loss, headache, abdominal pain, lower extremity swelling.  Today he is accompanied by his daughter to clinic.  Patient's case was discussed on tumor conference on January 14, 2018.  Given his heavy smoking history/COPD/emphysema, risks excised for biopsy. Consensus decision was to obtain a PET scan, pulmonary lung function testing.  If his lung mass is hypermetabolic on PET scan and no other involvement, he will have surgical evaluation regarding possibilities of resection as well as obtaining diagnosis.  # PET scan  1. 15.2 mm nodule of concern on recent lung cancer screening CT shows no appreciable change in the 8 day interval since that exam. As such, low level hypermetabolism on today's PET study is more likely related to neoplastic etiology than infection/inflammation. No evidence for hypermetabolic hilar or mediastinal lymph nodes. 2. Focal hypermetabolic  FDG accumulation in the distal sigmoid colon with really no marked FDG uptake in the colon elsewhere. CT imaging shows an apparent focal soft tissue lesion at this location. Advanced adenoma or neoplasm could have this appearance. Correlation with colorectal cancer screening history recommended and colonoscopy may be warranted. 3.  Emphysema. (ICD10-J43.9) 4.  Aortic Atherosclerois (ICD10-170.0)  # Biopsy was done at [place]  On 02/03/2018.  A.Right Lung, Fine Needle Aspiration II (smears and cell block):  Non-small cell carcinoma with histologic changes and immunohistochemical profile consistent with lung adenocarcinoma, moderately differentiated.   Specimen Adequacy:Satisfactory for evaluation.        Note: Floaters identified on cell block.  B.Cytology core biopsy:  Non-small cell carcinoma with histologic changes and  immunohistochemical profile consistent with lung adenocarcinoma,  moderately differentiated.   # 02/23/2018 patient is s/p right thoracotomy and wedge resection. Post surgical course complicated with prolong air leak/pnemothorax.    INTERVAL HISTORY Joseph Barron is a 67 y.o. 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  Patient reports shortness of breath with exertion.  He finished his short-term disability and went back to work recently.  Work on Architect sites review shortness of breath after doing any outdoor duty.   Also reports cough, nonproductive.  Denies any fever or chills.  No alleviating or aggravating factors.  He has stopped smoking Appetite is fair.  Denies any fever or chills, chest pain Fatigue, mild and has been on baseline.  Not better or worse.  Review of Systems  Constitutional: Positive for malaise/fatigue. Negative for chills, fever and weight loss.  HENT: Negative for congestion, ear discharge, ear pain, nosebleeds, sinus pain and sore  throat.   Eyes: Negative for double vision, photophobia, pain, discharge and redness.  Respiratory: Positive for cough and shortness of breath. Negative for hemoptysis, sputum production and wheezing.   Cardiovascular: Negative for chest pain, palpitations, orthopnea, claudication and leg swelling.  Gastrointestinal: Negative for abdominal pain, blood in stool, constipation, diarrhea, heartburn, melena, nausea and vomiting.  Genitourinary: Negative for dysuria, flank pain, frequency and hematuria.  Musculoskeletal: Negative for back pain, myalgias and neck pain.  Skin: Negative for itching and rash.  Neurological: Negative for dizziness, tingling, tremors, sensory change, focal weakness, weakness and headaches.  Endo/Heme/Allergies: Negative for environmental allergies. Does not bruise/bleed easily.  Psychiatric/Behavioral: Negative for depression and hallucinations. The patient is not nervous/anxious.     MEDICAL HISTORY:  Past Medical History:  Diagnosis Date  . 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 GERD (gastroesophageal reflux disease)   . Hyperlipemia   . Hypertension   . Non-small cell lung cancer ([place]) 12/2017   surgical resection 02/23/2018.  . Pulmonary nodules 01/13/2018  . Vaccine counseling 12/22/2017    SURGICAL HISTORY: Past Surgical History:  Procedure Laterality Date  . COLONOSCOPY WITH PROPOFOL N/A 02/22/2018   Procedure: COLONOSCOPY WITH PROPOFOL;  Surgeon: Manya Silvas, MD;  Location: [facility]OSCOPY;  Service: Endoscopy;  Laterality: N/A;  . EYE SURGERY Left child  . THORACOTOMY/LOBECTOMY Right 02/23/2018   Procedure: THORACOTOMY/ WEDGE RESECTION;  Surgeon: Nestor Lewandowsky, MD;  Location: [facility] ORS;  Service: Thoracic;  Laterality: Right;  Marland Kitchen VIDEO BRONCHOSCOPY N/A 02/23/2018   Procedure: PREOP BRONCHOSCOPY;  Surgeon: Nestor Lewandowsky, MD;  Location: [facility] ORS;  Service: Thoracic;  Laterality: N/A;    SOCIAL HISTORY: Social History   Socioeconomic History  .  Marital status: Married    Spouse name: Not on file  . Number of children: Not on file  . Years of education: Not on file  . Highest education level: Not on file  Occupational History  . Not on file  Social Needs  . Financial resource strain: Not on file  . Food insecurity:    Worry: Not on file    Inability: Not on file  . Transportation needs:    Medical: Not on file    Non-medical: Not on file  Tobacco Use  . Smoking status: Former Smoker    Packs/day: 1.00    Years: 50.00    Pack years: 50.00    Types: Cigarettes    Last attempt to quit: 11/17/2017    Years since quitting: 0.6  . Smokeless tobacco: Former Systems developer    Types: [place] date: 01/15/1989  Substance and Sexual Activity  . Alcohol use: Yes    Alcohol/week: 9.0 standard drinks    Types: 7 Cans of beer, 2 Shots of liquor per week  . Drug use: Never  . Sexual activity: Yes  Lifestyle  . Physical activity:    Days per week: Not on file    Minutes per session: Not on file  . Stress: Not on file  Relationships  . Social connections:    Talks on phone: Not on file    Gets together: Not on file    Attends religious service: Not on file    Active member of club or organization: Not on file    Attends meetings of clubs or organizations: Not on file    Relationship status: Not on file  . Intimate partner violence:  Fear of current or ex partner: Not on file    Emotionally abused: Not on file    Physically abused: Not on file    Forced sexual activity: Not on file  Other Topics Concern  . Not on file  Social History Narrative  . Not on file    FAMILY HISTORY: Family History  Problem Relation Age of Onset  . Arthritis Mother   . COPD Father   . Coronary artery disease Father   . Transient ischemic attack Father   . Alcoholism Father   . Bowel Disease Father   . COPD Sister   . Prostatitis Brother   . Thyroid disease Sister   . Thyroid disease Sister   . Liver cancer Brother   . Diabetes Brother       ALLERGIES:  has No Known Allergies.  MEDICATIONS:  Current Outpatient Medications  Medication Sig Dispense Refill  . albuterol (PROVENTIL HFA;VENTOLIN HFA) 108 (90 Base) MCG/ACT inhaler Inhale 2 puffs into the lungs every 6 (six) hours as needed for wheezing or shortness of breath. 1 Inhaler 2  . atorvastatin (LIPITOR) 10 MG tablet Take 10 mg by mouth at bedtime.     . fluticasone-salmeterol (ADVAIR HFA) 115-21 MCG/ACT inhaler Inhale 2 puffs into the lungs 2 (two) times daily. 1 Inhaler 12  . lisinopril (PRINIVIL,ZESTRIL) 20 MG tablet Take 20 mg by mouth daily.    Marland Kitchen omeprazole (PRILOSEC) 40 MG capsule Take by mouth daily.  11  . Tiotropium Bromide Monohydrate (SPIRIVA RESPIMAT) 1.25 MCG/ACT AERS Inhale 2 puffs into the lungs 2 (two) times daily. 1 Inhaler 5   No current facility-administered medications for this visit.      PHYSICAL EXAMINATION: ECOG PERFORMANCE STATUS: 1 - Symptomatic but completely ambulatory Vitals:   07/01/18 1411  BP: (!) 158/89  Pulse: 94  Resp: 18  Temp: 98.2 F (36.8 C)  SpO2: 97%   Filed Weights   07/01/18 1411  Weight: 156 lb 12.8 oz (71.1 kg)    Physical Exam  Constitutional: He is oriented to person, place, and time. No distress.  HENT:  Head: Normocephalic and atraumatic.  Nose: Nose normal.  Mouth/Throat: Oropharynx is clear and moist. No oropharyngeal exudate.  Eyes: Pupils are equal, round, and reactive to light. EOM are normal. Right eye exhibits no discharge. Left eye exhibits no discharge. No scleral icterus.  Neck: Normal range of motion. Neck supple. No JVD present.  Cardiovascular: Normal rate, regular rhythm and normal heart sounds.  No murmur heard. Pulmonary/Chest: Effort normal. No respiratory distress. He has no wheezes. He has no rales. He exhibits no tenderness.  Decreased breath sound bilaterally. Right posterior chest wall surgical scar well healed.   Abdominal: Soft. Bowel sounds are normal. He exhibits no distension.  There is no tenderness.  Musculoskeletal: Normal range of motion. He exhibits no edema or tenderness.  Lymphadenopathy:    He has no cervical adenopathy.  Neurological: He is alert and oriented to person, place, and time. No cranial nerve deficit. He exhibits normal muscle tone. Coordination normal.  Skin: Skin is warm and dry. No rash noted. He is not diaphoretic. No erythema.  Psychiatric: Affect and judgment normal.     LABORATORY DATA:  I have reviewed the data as listed Lab Results  Component Value Date   WBC 8.2 07/01/2018   HGB 12.3 (L) 07/01/2018   HCT 36.5 (L) 07/01/2018   MCV 92.7 07/01/2018   PLT 408 07/01/2018   Recent Labs    02/18/18  1449  03/10/18 0414 03/11/18 0413 06/25/18 0921 07/01/18 1348  NA 139   < > 138 137  --  138  K 3.8   < > 4.9 4.6  --  4.4  CL 106   < > 101 99*  --  107  CO2 26   < > 30 29  --  23  GLUCOSE 130*   < > 104* 117*  --  126*  BUN 12   < > 49* 35*  --  17  CREATININE 1.29*   < > 1.31* 1.14 1.20 1.29*  CALCIUM 9.0   < > 9.5 9.6  --  9.3  GFRNONAA 55*   < > 54* >60  --  55*  GFRAA >60   < > >60 >60  --  >60  PROT 7.5  --  7.3  --   --  7.7  ALBUMIN 3.7  --  3.1*  --   --  3.6  AST 30  --  53*  --   --  27  ALT 26  --  74*  --   --  27  ALKPHOS 87  --  134*  --   --  90  BILITOT 0.5  --  0.5  --   --  0.3   < > = values in this interval not displayed.   Surgical Pathology  CASE: [id number]  PATIENT: Joseph Barron  Surgical Pathology Report  DIAGNOSIS:  A. LUNG, RIGHT UPPER LOBE; WEDGE RESECTION:  - LARGE CELL NEUROENDOCRINE CARCINOMA.  - SEE CANCER SUMMARY BELOW.  - CHANGE CONSISTENT WITH PRIOR FNA.  - REACTIVE SUBPLEURAL SMOOTH MUSCLE HYPERPLASIA.   B. PLEURA, RIGHT; BIOPSY:  - HYALINE PLEURAL PLAQUE WITH FOCAL REACTIVE MESOTHELIAL HYPERPLASIA.  - NEGATIVE FOR MALIGNANCY.   RADIOGRAPHIC STUDIES: I have personally reviewed the radiological images as listed and agreed with the findings in the report. CT chest with  contrast 06/25/2018 1. Postoperative scarring and volume loss in the right hemithorax. 7 mm nodule along the minor fissure is difficult to compare with the prior study given differences in parenchymal architecture on the current study. Attention on followup exams is warranted. This examination will serve as baseline for future comparison. 2. Borderline enlarged bilateral hilar lymph nodes, nonspecific. 3. Thin rind of pleural fluid and thickening in the right hemithorax. 4.  Aortic atherosclerosis (ICD10-170.0). 5.  Emphysema (ICD10-J43.9).   ASSESSMENT & PLAN:  1. Large cell neuroendocrine carcinoma ([place])   2. Pulmonary emphysema, unspecified emphysema type ([place])   3. SOB (shortness of breath) on exertion   4. Chronic cough   5. Anemia, unspecified type    #Large cell neuroendocrine carcinoma, declined adjuvant chemotherapy and radiation.  Surveillance CT chest abdomen pelvis done on 06/25/2018.  Independently reviewed by me and discussed with patient. There is volume loss postsurgical scarring in the right hemithorax.  A 7 mm nodule along the minor fissure is difficult to compare with the prior study given difference in parenchymal architecture on the current study. Follow-up exam is warranted.  This examination serves as a new baseline for future comparison.  There is borderline enlarged bilateral hilar lymph nodes, nonspecific. Single draining of pleural effusion and thickening of the right hemithorax.  Chronic changes including emphysema and Aortic atherosclerosis  Recommend close follow-up repeating of the surveillance CT scan in 3 months.  7 mm nodule is too small to be characterized at this point.   #Severe COPD with emphysema, based on PFT as well as CT  imaging. secondary to his extensive history of smoking.  He was last seen by Dr. Mortimer Fries at the end of July this year.  Patient takes Advair, Spiriva.  Albuterol as needed.  Follow-up with pulmonology # SOB/chronic cough due to  emphysema/COPD.  I do not appreciate any wheezing on physical examination.  Do not think this is an acute exacerbation.   He has shortness of breath with exertion.  I think is reasonable to have paperwork done for his application for long-term disability. Ask patient to bring to office.  #Mild anemia, labs reviewed and discussed with patient.  Hemoglobin 12.3, at his baseline.  Continue to monitor. All questions were answered. The patient knows to call the clinic with any problems questions or concerns.  Return of visit: 3 months after CT scan.   Earlie Server, MD, PhD Hematology Oncology [facility] Pager- [phone] .

## 2018-09-28 ENCOUNTER — Ambulatory Visit
Admission: RE | Admit: 2018-09-28 | Discharge: 2018-09-28 | Disposition: A | Discharge status: Home / Self Care | Payer: Medicare Other | Source: Ambulatory Visit | Attending: Oncology | Admitting: Oncology

## 2018-09-28 DIAGNOSIS — C7A8 Other malignant neuroendocrine tumors: Secondary | ICD-10-CM | POA: Diagnosis present

## 2018-09-28 MED ORDER — IOHEXOL 300 MG/ML  SOLN
100.0000 mL | Freq: Once | INTRAMUSCULAR | Status: AC | PRN
  Administered 2018-09-28: 100 mL via INTRAVENOUS

## 2018-09-30 ENCOUNTER — Inpatient Hospital Stay (HOSPITAL_BASED_OUTPATIENT_CLINIC_OR_DEPARTMENT_OTHER): Payer: Medicare Other | Admitting: Oncology

## 2018-09-30 ENCOUNTER — Inpatient Hospital Stay: Payer: Medicare Other | Attending: Oncology

## 2018-09-30 ENCOUNTER — Encounter: Payer: Self-pay | Admitting: Oncology

## 2018-09-30 ENCOUNTER — Other Ambulatory Visit: Payer: Self-pay

## 2018-09-30 VITALS — BP 148/82 | HR 88 | Temp 97.3°F | Resp 18 | Wt 153.4 lb

## 2018-09-30 DIAGNOSIS — Z79899 Other long term (current) drug therapy: Secondary | ICD-10-CM | POA: Insufficient documentation

## 2018-09-30 DIAGNOSIS — C7A090 Malignant carcinoid tumor of the bronchus and lung: Secondary | ICD-10-CM | POA: Diagnosis present

## 2018-09-30 DIAGNOSIS — C7A8 Other malignant neuroendocrine tumors: Secondary | ICD-10-CM

## 2018-09-30 DIAGNOSIS — Z87891 Personal history of nicotine dependence: Secondary | ICD-10-CM

## 2018-09-30 DIAGNOSIS — R053 Chronic cough: Secondary | ICD-10-CM

## 2018-09-30 DIAGNOSIS — I10 Essential (primary) hypertension: Secondary | ICD-10-CM | POA: Insufficient documentation

## 2018-09-30 DIAGNOSIS — R05 Cough: Secondary | ICD-10-CM

## 2018-09-30 DIAGNOSIS — R0602 Shortness of breath: Secondary | ICD-10-CM

## 2018-09-30 LAB — COMPREHENSIVE METABOLIC PANEL
ALK PHOS: 98 U/L (ref 38–126)
ALT: 28 U/L (ref 0–44)
AST: 27 U/L (ref 15–41)
Albumin: 3.9 g/dL (ref 3.5–5.0)
Anion gap: 9 (ref 5–15)
BUN: 14 mg/dL (ref 8–23)
CALCIUM: 9.9 mg/dL (ref 8.9–10.3)
CO2: 26 mmol/L (ref 22–32)
Chloride: 103 mmol/L (ref 98–111)
Creatinine, Ser: 1.1 mg/dL (ref 0.61–1.24)
GFR calc Af Amer: >60 mL/min (ref >60)
GFR calc non Af Amer: >60 mL/min (ref >60)
Glucose, Bld: 94 mg/dL (ref 70–99)
Potassium: 4.6 mmol/L (ref 3.5–5.1)
Sodium: 138 mmol/L (ref 135–145)
Total Bilirubin: 0.5 mg/dL (ref 0.3–1.2)
Total Protein: 8.1 g/dL (ref 6.5–8.1)

## 2018-09-30 LAB — CBC WITH DIFFERENTIAL/PLATELET
Abs Immature Granulocytes: 0.04 10*3/uL (ref 0.00–0.07)
Basophils Absolute: 0.1 10*3/uL (ref 0.0–0.1)
Basophils Relative: 1 %
Eosinophils Absolute: 0.4 10*3/uL (ref 0.0–0.5)
Eosinophils Relative: 3 %
HEMATOCRIT: 40.2 % (ref 39.0–52.0)
HEMOGLOBIN: 13.1 g/dL (ref 13.0–17.0)
Immature Granulocytes: 0 %
LYMPHS PCT: 31 %
Lymphs Abs: 3.6 10*3/uL (ref 0.7–4.0)
MCH: 29.8 pg (ref 26.0–34.0)
MCHC: 32.6 g/dL (ref 30.0–36.0)
MCV: 91.4 fL (ref 80.0–100.0)
Monocytes Absolute: 1.1 10*3/uL — ABNORMAL HIGH (ref 0.1–1.0)
Monocytes Relative: 10 %
Neutro Abs: 6.3 10*3/uL (ref 1.7–7.7)
Neutrophils Relative %: 55 %
Platelets: 317 10*3/uL (ref 150–400)
RBC: 4.4 MIL/uL (ref 4.22–5.81)
RDW: 13.5 % (ref 11.5–15.5)
WBC: 11.5 10*3/uL — ABNORMAL HIGH (ref 4.0–10.5)
nRBC: 0 % (ref 0.0–0.2)

## 2018-09-30 NOTE — Progress Notes (Signed)
Patient here today for follow up.  Patient states that he is not using albuterol, advair and Tiotropium Bromide inhalers and he only seen by pulmonology one time, pt states that no one from pulmonology set him up with a follow up appt.

## 2018-09-30 NOTE — Progress Notes (Signed)
Hematology/Oncology Follow up note [facility] [phone] Fax:([phone]) [phone]   Patient Care Team: Dion Body, MD as PCP - General (Family Medicine) Telford Nab, RN as Registered Nurse  REFERRING PROVIDER: Dion Body, MD  Lung [place] VISIT Follow up for lung adenocarcinoma.  HISTORY OF PRESENTING ILLNESS:  Joseph Barron is a  67 y.o.  male with PMH listed below who was referred to me for evaluation of lung mass.  Patient had CT chest lung cancer screening done on January 12, 2018 which showed there is a spiculated nodule with a volume derived mean diameter of 15.2 mm, which abuts the pleural surface, highly concerning for primary bronchogenic neoplasm.  He is former smoker, quit recently. 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  Reports mild shortness of breath with climbing stairs otherwise feeling well.  He has a chronic cough productive with clear sputum.  Denies any weight loss, headache, abdominal pain, lower extremity swelling.  Today he is accompanied by his daughter to clinic.  Patient's case was discussed on tumor conference on January 14, 2018.  Given his heavy smoking history/COPD/emphysema, risks excised for biopsy. Consensus decision was to obtain a PET scan, pulmonary lung function testing.  If his lung mass is hypermetabolic on PET scan and no other involvement, he will have surgical evaluation regarding possibilities of resection as well as obtaining diagnosis.  # PET scan  1. 15.2 mm nodule of concern on recent lung cancer screening CT shows no appreciable change in the 8 day interval since that exam. As such, low level hypermetabolism on today's PET study is more likely related to neoplastic etiology than infection/inflammation. No evidence for hypermetabolic hilar or mediastinal lymph nodes. 2. Focal hypermetabolic  FDG accumulation in the distal sigmoid colon with really no marked FDG uptake in the colon elsewhere. CT imaging shows an apparent focal soft tissue lesion at this location. Advanced adenoma or neoplasm could have this appearance. Correlation with colorectal cancer screening history recommended and colonoscopy may be warranted. 3.  Emphysema. (ICD10-J43.9) 4.  Aortic Atherosclerois (ICD10-170.0)  # Biopsy was done at [place]  On 02/03/2018.  A.Right Lung, Fine Needle Aspiration II (smears and cell block):  Non-small cell carcinoma with histologic changes and immunohistochemical profile consistent with lung adenocarcinoma, moderately differentiated.   Specimen Adequacy:Satisfactory for evaluation.        Note: Floaters identified on cell block.  B.Cytology core biopsy:  Non-small cell carcinoma with histologic changes and  immunohistochemical profile consistent with lung adenocarcinoma,  moderately differentiated.   # 02/23/2018 patient is s/p right thoracotomy and wedge resection. Post surgical course complicated with prolong air leak/pnemothorax.    INTERVAL HISTORY Joseph Barron is a 67 y.o. 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  Patient reports chronic cough has worsened lately.  Not associated with fever or chills.  Denies hemoptysis, fever, chills, weight loss. Shortness of breath with moderate exertion.  Not using inhalers as instructed. Chronic fatigue, at baseline.  No headache or double vision.   Review of Systems  Constitutional: Positive for fatigue. Negative for appetite change, chills, fever and unexpected weight change.  HENT:   Negative for hearing loss and voice change.   Eyes: Negative for eye problems and icterus.  Respiratory: Positive for cough and shortness of breath. Negative for chest  tightness.   Cardiovascular: Negative for chest pain and leg  swelling.  Gastrointestinal: Negative for abdominal distention and abdominal pain.  Endocrine: Negative for hot flashes.  Genitourinary: Negative for difficulty urinating, dysuria and frequency.   Musculoskeletal: Negative for arthralgias.  Skin: Negative for itching and rash.  Neurological: Negative for light-headedness and numbness.  Hematological: Negative for adenopathy. Does not bruise/bleed easily.  Psychiatric/Behavioral: Negative for confusion.     MEDICAL HISTORY:  Past Medical History:  Diagnosis Date  . 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 GERD (gastroesophageal reflux disease)   . Hyperlipemia   . Hypertension   . Non-small cell lung cancer ([place]) 12/2017   surgical resection 02/23/2018.  . Pulmonary nodules 01/13/2018  . Vaccine counseling 12/22/2017    SURGICAL HISTORY: Past Surgical History:  Procedure Laterality Date  . COLONOSCOPY WITH PROPOFOL N/A 02/22/2018   Procedure: COLONOSCOPY WITH PROPOFOL;  Surgeon: Manya Silvas, MD;  Location: [facility] ENDOSCOPY;  Service: Endoscopy;  Laterality: N/A;  . EYE SURGERY Left child  . THORACOTOMY/LOBECTOMY Right 02/23/2018   Procedure: THORACOTOMY/ WEDGE RESECTION;  Surgeon: Nestor Lewandowsky, MD;  Location: [facility] ORS;  Service: Thoracic;  Laterality: Right;  Marland Kitchen VIDEO BRONCHOSCOPY N/A 02/23/2018   Procedure: PREOP BRONCHOSCOPY;  Surgeon: Nestor Lewandowsky, MD;  Location: [facility] ORS;  Service: Thoracic;  Laterality: N/A;    SOCIAL HISTORY: Social History   Socioeconomic History  . Marital status: Married    Spouse name: Not on file  . Number of children: Not on file  . Years of education: Not on file  . Highest education level: Not on file  Occupational History  . Not on file  Social Needs  . Financial resource strain: Not on file  . Food insecurity:    Worry: Not on file    Inability: Not on file  . Transportation needs:    Medical: Not on file    Non-medical: Not on file  Tobacco Use  . Smoking status: Former Smoker     Packs/day: 1.00    Years: 50.00    Pack years: 50.00    Types: Cigarettes    Last attempt to quit: 11/17/2017    Years since quitting: 0.8  . Smokeless tobacco: Former Systems developer    Types: [place] date: 01/15/1989  Substance and Sexual Activity  . Alcohol use: Yes    Alcohol/week: 9.0 standard drinks    Types: 7 Cans of beer, 2 Shots of liquor per week  . Drug use: Never  . Sexual activity: Yes  Lifestyle  . Physical activity:    Days per week: Not on file    Minutes per session: Not on file  . Stress: Not on file  Relationships  . Social connections:    Talks on phone: Not on file    Gets together: Not on file    Attends religious service: Not on file    Active member of club or organization: Not on file    Attends meetings of clubs or organizations: Not on file    Relationship status: Not on file  . Intimate partner violence:    Fear of current or ex partner: Not on file    Emotionally abused: Not on file    Physically abused: Not on file    Forced sexual activity: Not on file  Other Topics Concern  . Not on file  Social History Narrative  . Not on file    FAMILY HISTORY: Family History  Problem Relation Age of Onset  .  Arthritis Mother   . COPD Father   . Coronary artery disease Father   . Transient ischemic attack Father   . Alcoholism Father   . Bowel Disease Father   . COPD Sister   . Prostatitis Brother   . Thyroid disease Sister   . Thyroid disease Sister   . Liver cancer Brother   . Diabetes Brother     ALLERGIES:  has No Known Allergies.  MEDICATIONS:  Current Outpatient Medications  Medication Sig Dispense Refill  . atorvastatin (LIPITOR) 10 MG tablet Take 10 mg by mouth at bedtime.     Marland Kitchen lisinopril (PRINIVIL,ZESTRIL) 20 MG tablet Take 20 mg by mouth daily.    Marland Kitchen omeprazole (PRILOSEC) 40 MG capsule Take by mouth daily.  11   No current facility-administered medications for this visit.      PHYSICAL EXAMINATION: ECOG PERFORMANCE STATUS:  1 - Symptomatic but completely ambulatory Vitals:   09/30/18 1457  BP: (!) 148/82  Pulse: 88  Resp: 18  Temp: (!) 97.3 F (36.3 C)   Filed Weights   09/30/18 1457  Weight: 153 lb 7 oz (69.6 kg)    Physical Exam  Constitutional: He is oriented to person, place, and time. No distress.  HENT:  Head: Normocephalic and atraumatic.  Nose: Nose normal.  Mouth/Throat: Oropharynx is clear and moist. No oropharyngeal exudate.  Eyes: Pupils are equal, round, and reactive to light. EOM are normal. Right eye exhibits no discharge. Left eye exhibits no discharge. No scleral icterus.  Neck: Normal range of motion. Neck supple. No JVD present.  Cardiovascular: Normal rate, regular rhythm and normal heart sounds.  No murmur heard. Pulmonary/Chest: Effort normal. No respiratory distress. He has no wheezes. He has no rales. He exhibits no tenderness.  Decreased breath sound bilaterally. Right posterior chest wall surgical scar well healed.   Abdominal: Soft. Bowel sounds are normal. He exhibits no distension. There is no tenderness.  Musculoskeletal: Normal range of motion. He exhibits no edema or tenderness.  Lymphadenopathy:    He has no cervical adenopathy.  Neurological: He is alert and oriented to person, place, and time. No cranial nerve deficit. He exhibits normal muscle tone. Coordination normal.  Skin: Skin is warm and dry. No rash noted. He is not diaphoretic. No erythema.  Psychiatric: Affect and judgment normal.     LABORATORY DATA:  I have reviewed the data as listed Lab Results  Component Value Date   WBC 11.5 (H) 09/30/2018   HGB 13.1 09/30/2018   HCT 40.2 09/30/2018   MCV 91.4 09/30/2018   PLT 317 09/30/2018   Recent Labs    03/10/18 0414 03/11/18 0413 06/25/18 0921 07/01/18 1348 09/30/18 1437  NA 138 137  --  138 138  K 4.9 4.6  --  4.4 4.6  CL 101 99*  --  107 103  CO2 30 29  --  23 26  GLUCOSE 104* 117*  --  126* 94  BUN 49* 35*  --  17 14  CREATININE 1.31*  1.14 1.20 1.29* 1.10  CALCIUM 9.5 9.6  --  9.3 9.9  GFRNONAA 54* >60  --  55* >60  GFRAA >60 >60  --  >60 >60  PROT 7.3  --   --  7.7 8.1  ALBUMIN 3.1*  --   --  3.6 3.9  AST 53*  --   --  27 27  ALT 74*  --   --  27 28  ALKPHOS 134*  --   --  90 98  BILITOT 0.5  --   --  0.3 0.5   Surgical Pathology  CASE: [id number]  PATIENT: Isiah Keeler  Surgical Pathology Report  DIAGNOSIS:  A. LUNG, RIGHT UPPER LOBE; WEDGE RESECTION:  - LARGE CELL NEUROENDOCRINE CARCINOMA.  - SEE CANCER SUMMARY BELOW.  - CHANGE CONSISTENT WITH PRIOR FNA.  - REACTIVE SUBPLEURAL SMOOTH MUSCLE HYPERPLASIA.   B. PLEURA, RIGHT; BIOPSY:  - HYALINE PLEURAL PLAQUE WITH FOCAL REACTIVE MESOTHELIAL HYPERPLASIA.  - NEGATIVE FOR MALIGNANCY.   RADIOGRAPHIC STUDIES: I have personally reviewed the radiological images as listed and agreed with the findings in the report. CT chest with contrast 06/25/2018 1. Postoperative scarring and volume loss in the right hemithorax. 7 mm nodule along the minor fissure is difficult to compare with the prior study given differences in parenchymal architecture on the current study. Attention on followup exams is warranted. This examination will serve as baseline for future comparison. 2. Borderline enlarged bilateral hilar lymph nodes, nonspecific. 3. Thin rind of pleural fluid and thickening in the right hemithorax. 4.  Aortic atherosclerosis (ICD10-170.0). 5.  Emphysema (ICD10-J43.9).   ASSESSMENT & PLAN:  1. Large cell neuroendocrine carcinoma (HCC)    #Large cell neuroendocrine carcinoma, declined adjuvant chemotherapy and radiation. Surveillance CT chest abdomen pelvis done on 09/28/2018.  Images were independently reviewed by me and discussed with patient CT is concerning for local recurrence with progressive multinodular thickening in the right upper lobe along the minor fissure and to a lesser extent along the upper margin of major fissure.  Localized airway thickening  proximally in the posterior basal segmental bronchus of the left lower lobe.  Stable. Suspicion of cancer recurrence discussed with patient.  I recommend obtaining PET scan for evaluation of hypermetabolic activity of these chest abdominal findings and also rule out distant metastasis.  I also will order MRI brain to rule out intracranial metastasis.  If local recurrence, recommend concurrent chemoradiation followed by chemotherapy systemically.  Refer to radiation oncology.  Patient feels that he is not sure if you want to proceed aggressive treatment.  Patient prefers to come back to my clinic have another discussion after MRI and PET scan.  Will also obtain Omniseq test.   We spent sufficient time to discuss many aspect of care, questions were answered to patient's satisfaction. The patient knows to call the clinic with any problems questions or concerns.  Return of visit: After MRI and PET scan. Total face to face encounter time for this patient visit was 25 min. >50% of the time was  spent in counseling and coordination of care.  Earlie Server, MD, PhD Hematology Oncology [facility] Pager- [phone] .

## 2018-10-05 ENCOUNTER — Ambulatory Visit
Admission: RE | Admit: 2018-10-05 | Discharge: 2018-10-05 | Disposition: A | Discharge status: Home / Self Care | Payer: Medicare Other | Source: Ambulatory Visit | Attending: Oncology | Admitting: Oncology

## 2018-10-05 DIAGNOSIS — C7A8 Other malignant neuroendocrine tumors: Secondary | ICD-10-CM | POA: Insufficient documentation

## 2018-10-05 LAB — GLUCOSE, CAPILLARY: Glucose-Capillary: 108 mg/dL — ABNORMAL HIGH (ref 70–99)

## 2018-10-05 MED ORDER — FLUDEOXYGLUCOSE F - 18 (FDG) INJECTION
8.2800 | Freq: Once | INTRAVENOUS | Status: AC | PRN
  Administered 2018-10-05: 8.28 via INTRAVENOUS

## 2018-10-06 ENCOUNTER — Telehealth: Payer: Self-pay | Admitting: *Deleted

## 2018-10-06 NOTE — Telephone Encounter (Signed)
Called requesting results of PET yesterday, next appointment 12/27.  IMPRESSION: 1. PET-CT findings consistent with recurrent tumor along the surgical resection bed in the right upper lobe as detailed above. There's also nodularity and interstitial thickening more posteriorly and inferiorly which is likely interstitial or endobronchial spread of tumor. 2. No enlarged or hypermetabolic mediastinal or hilar lymph nodes. 3. No findings for abdominal/pelvic metastatic disease.   Electronically Signed   By: Marijo Sanes M.D.   On: 10/05/2018 16:25

## 2018-10-06 NOTE — Telephone Encounter (Signed)
Patient has MRI on 12/19. Dr. Tasia Catchings will go over PET and MRI results at 12/27 appointment.

## 2018-10-13 ENCOUNTER — Other Ambulatory Visit: Payer: Medicare Other

## 2018-10-14 ENCOUNTER — Ambulatory Visit
Admission: RE | Admit: 2018-10-14 | Discharge: 2018-10-14 | Disposition: A | Discharge status: Home / Self Care | Payer: Medicare Other | Source: Ambulatory Visit | Attending: Oncology | Admitting: Oncology

## 2018-10-14 DIAGNOSIS — C7A8 Other malignant neuroendocrine tumors: Secondary | ICD-10-CM | POA: Diagnosis present

## 2018-10-14 DIAGNOSIS — Z8673 Personal history of transient ischemic attack (TIA), and cerebral infarction without residual deficits: Secondary | ICD-10-CM | POA: Insufficient documentation

## 2018-10-14 MED ORDER — GADOBUTROL 1 MMOL/ML IV SOLN
7.0000 mL | Freq: Once | INTRAVENOUS | Status: AC | PRN
  Administered 2018-10-14: 7 mL via INTRAVENOUS

## 2018-10-21 IMAGING — CR DG CHEST 2V
1 series · 2 of 2 positions shown · non-contrast
Comparison: 03/10/2018

CLINICAL DATA: Chest tube, postoperative

EXAM:
CHEST - 2 VIEW

[Series 1: dg chest 2 view · 0.14mm/px · 2 of 2 slices shown]
[im 1/2]
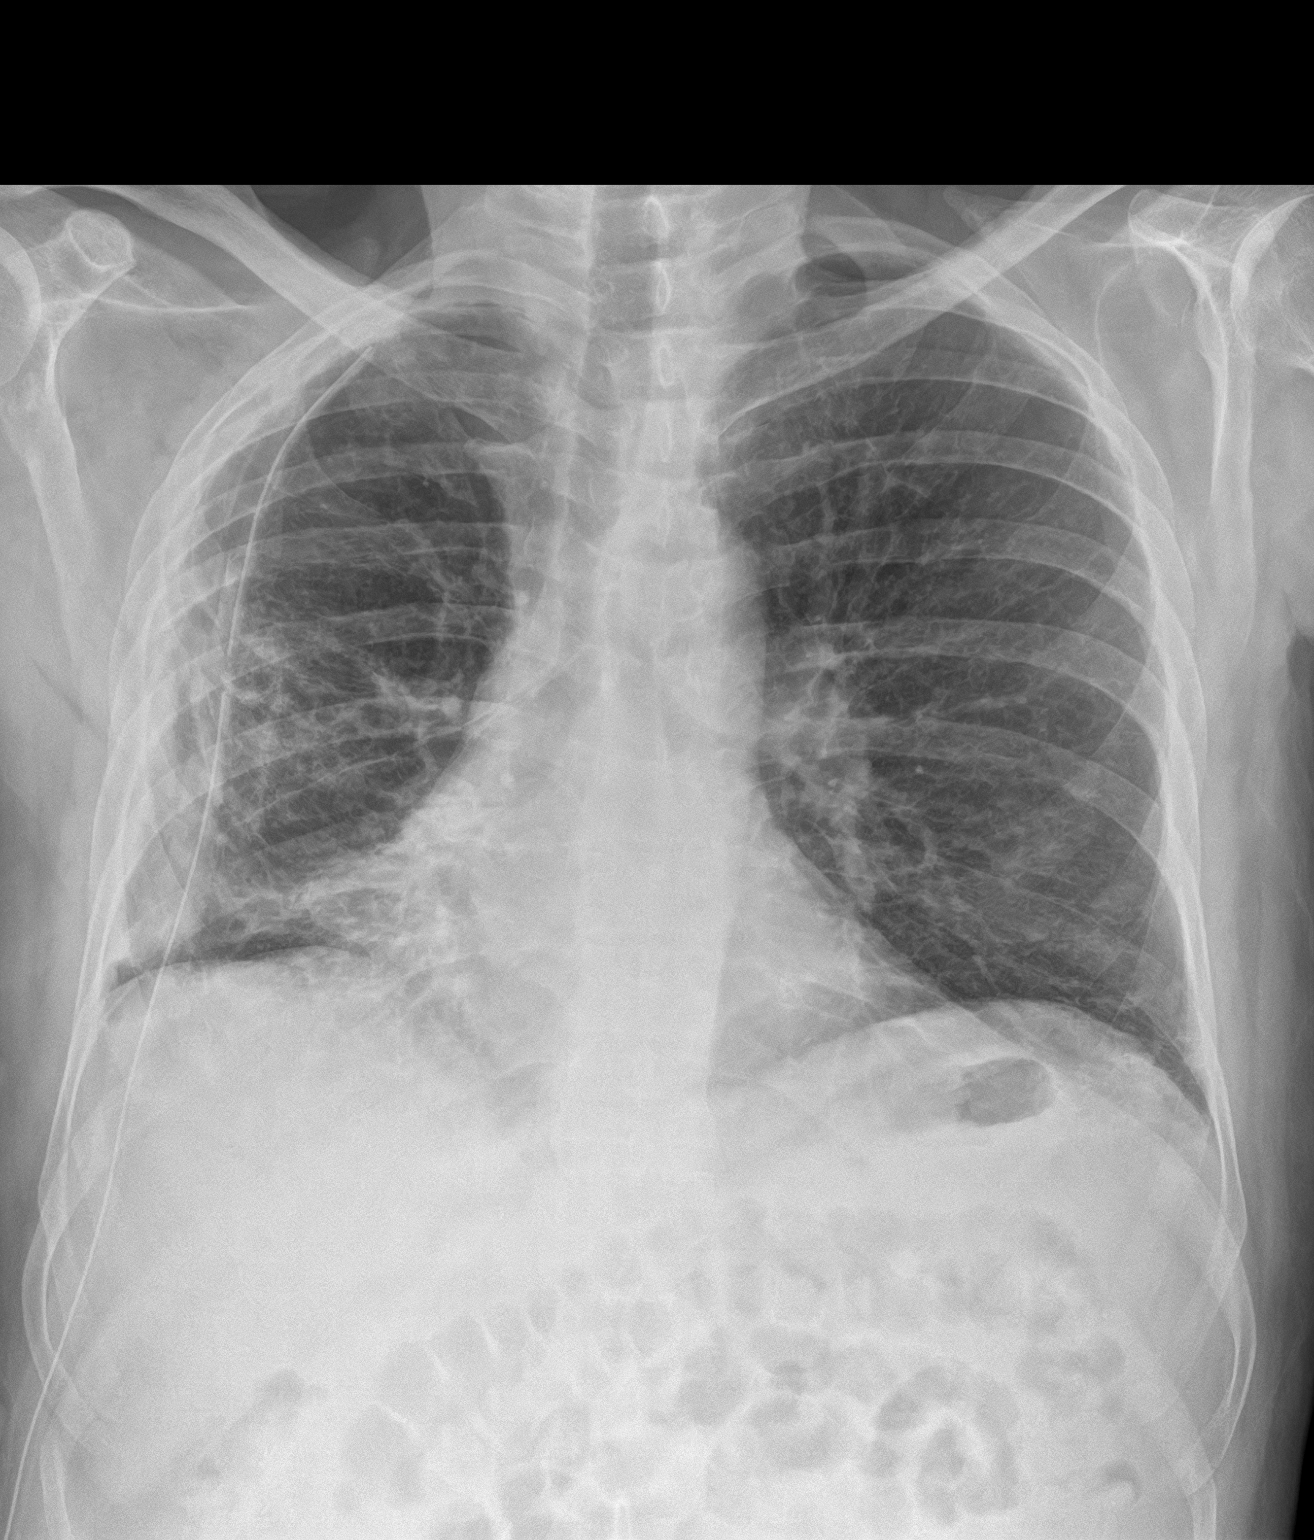
[im 2/2]
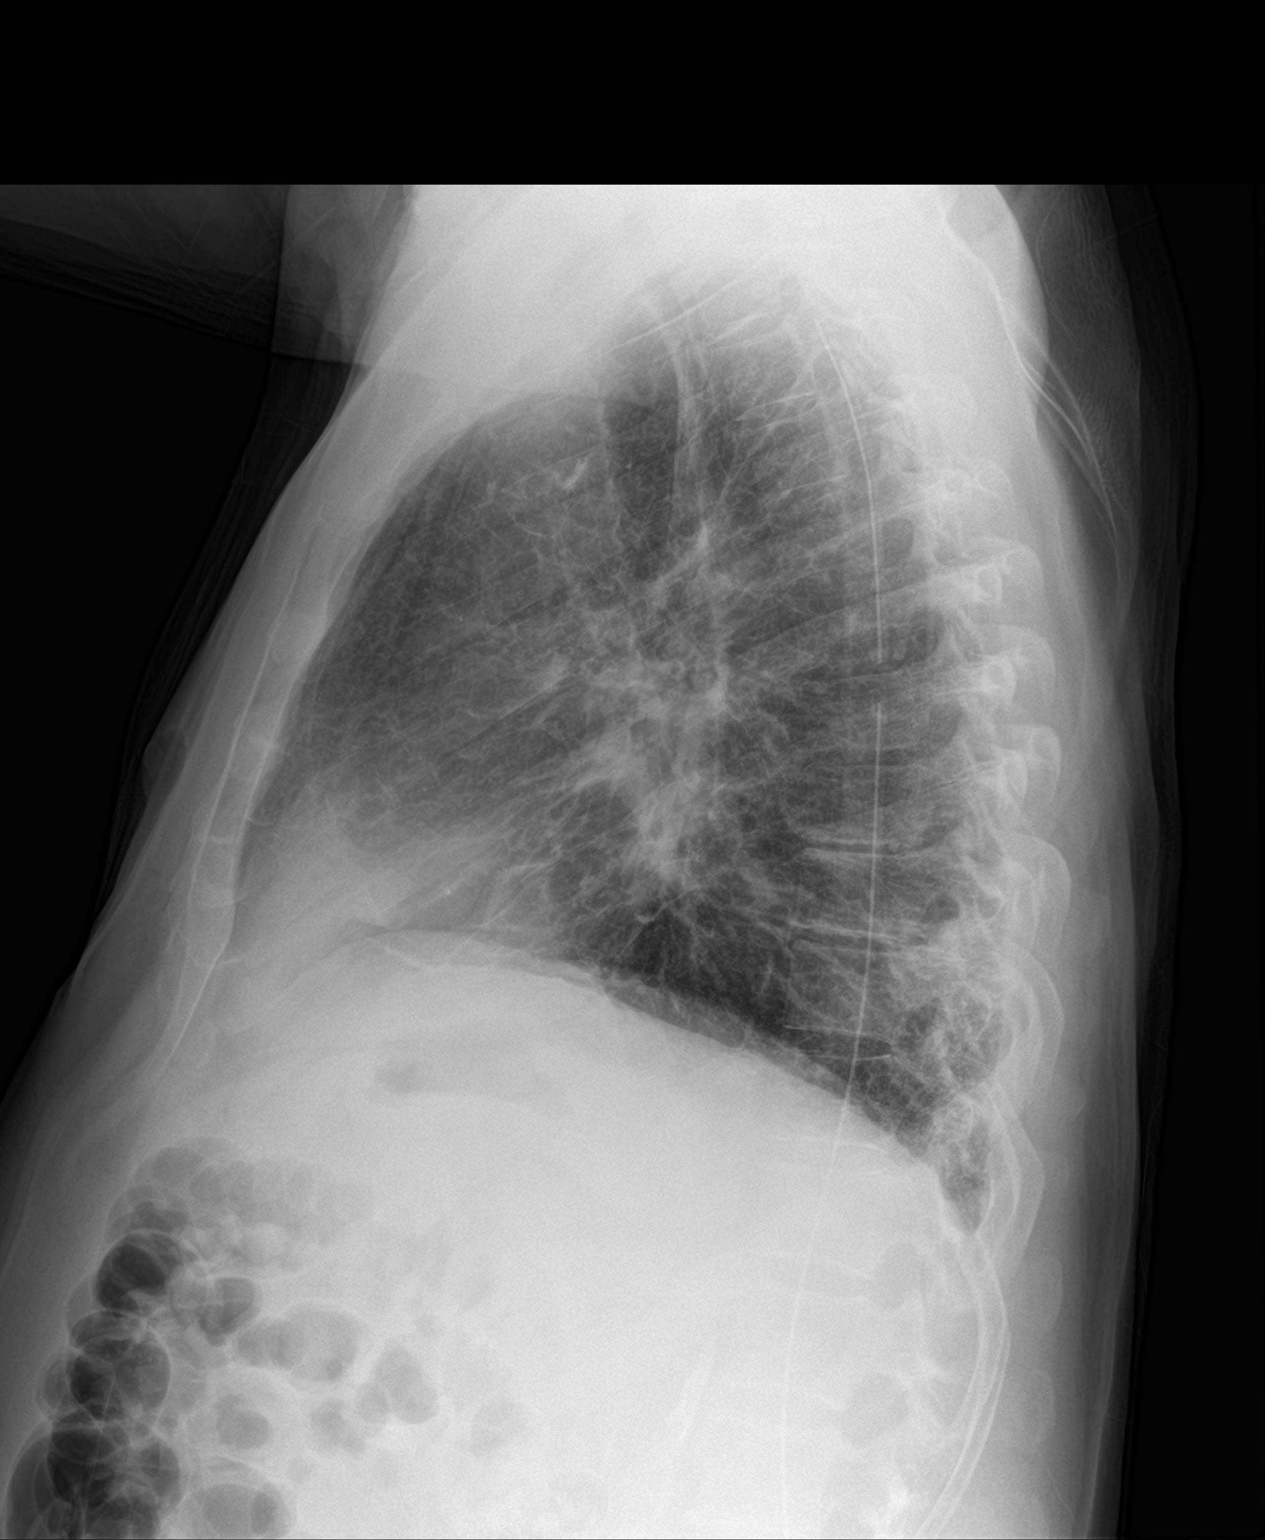

[2 of 2 positions shown; findings below may reference images not displayed]

FINDINGS: RIGHT thoracostomy tube unchanged.

Pigtail thoracostomy tube at RIGHT base on previous exam has been
removed.

Stable heart size and mediastinal contours.

Persistent pleural thickening or effusion at the lateral RIGHT chest
unchanged.

Minimal RIGHT subpulmonic pneumothorax.

RIGHT basilar atelectasis.

LEFT lung remains clear.

No acute osseous findings.
IMPRESSION: Stable RIGHT thoracostomy tube with interval removal of an
additional pigtail catheter at the inferior RIGHT hemithorax.

RIGHT basilar atelectasis with minimal subpulmonic pneumothorax.

## 2018-10-21 IMAGING — CR DG CHEST 2V
1 series · 2 of 2 positions shown · non-contrast
Comparison: 03/11/2018

CLINICAL DATA: Postop check; chest tube in place. Hx of HTN. Former
smoker.

EXAM:
CHEST - 2 VIEW

[Series 1: dg chest 2 view · 0.14mm/px · 2 of 2 slices shown]
[im 1/2]
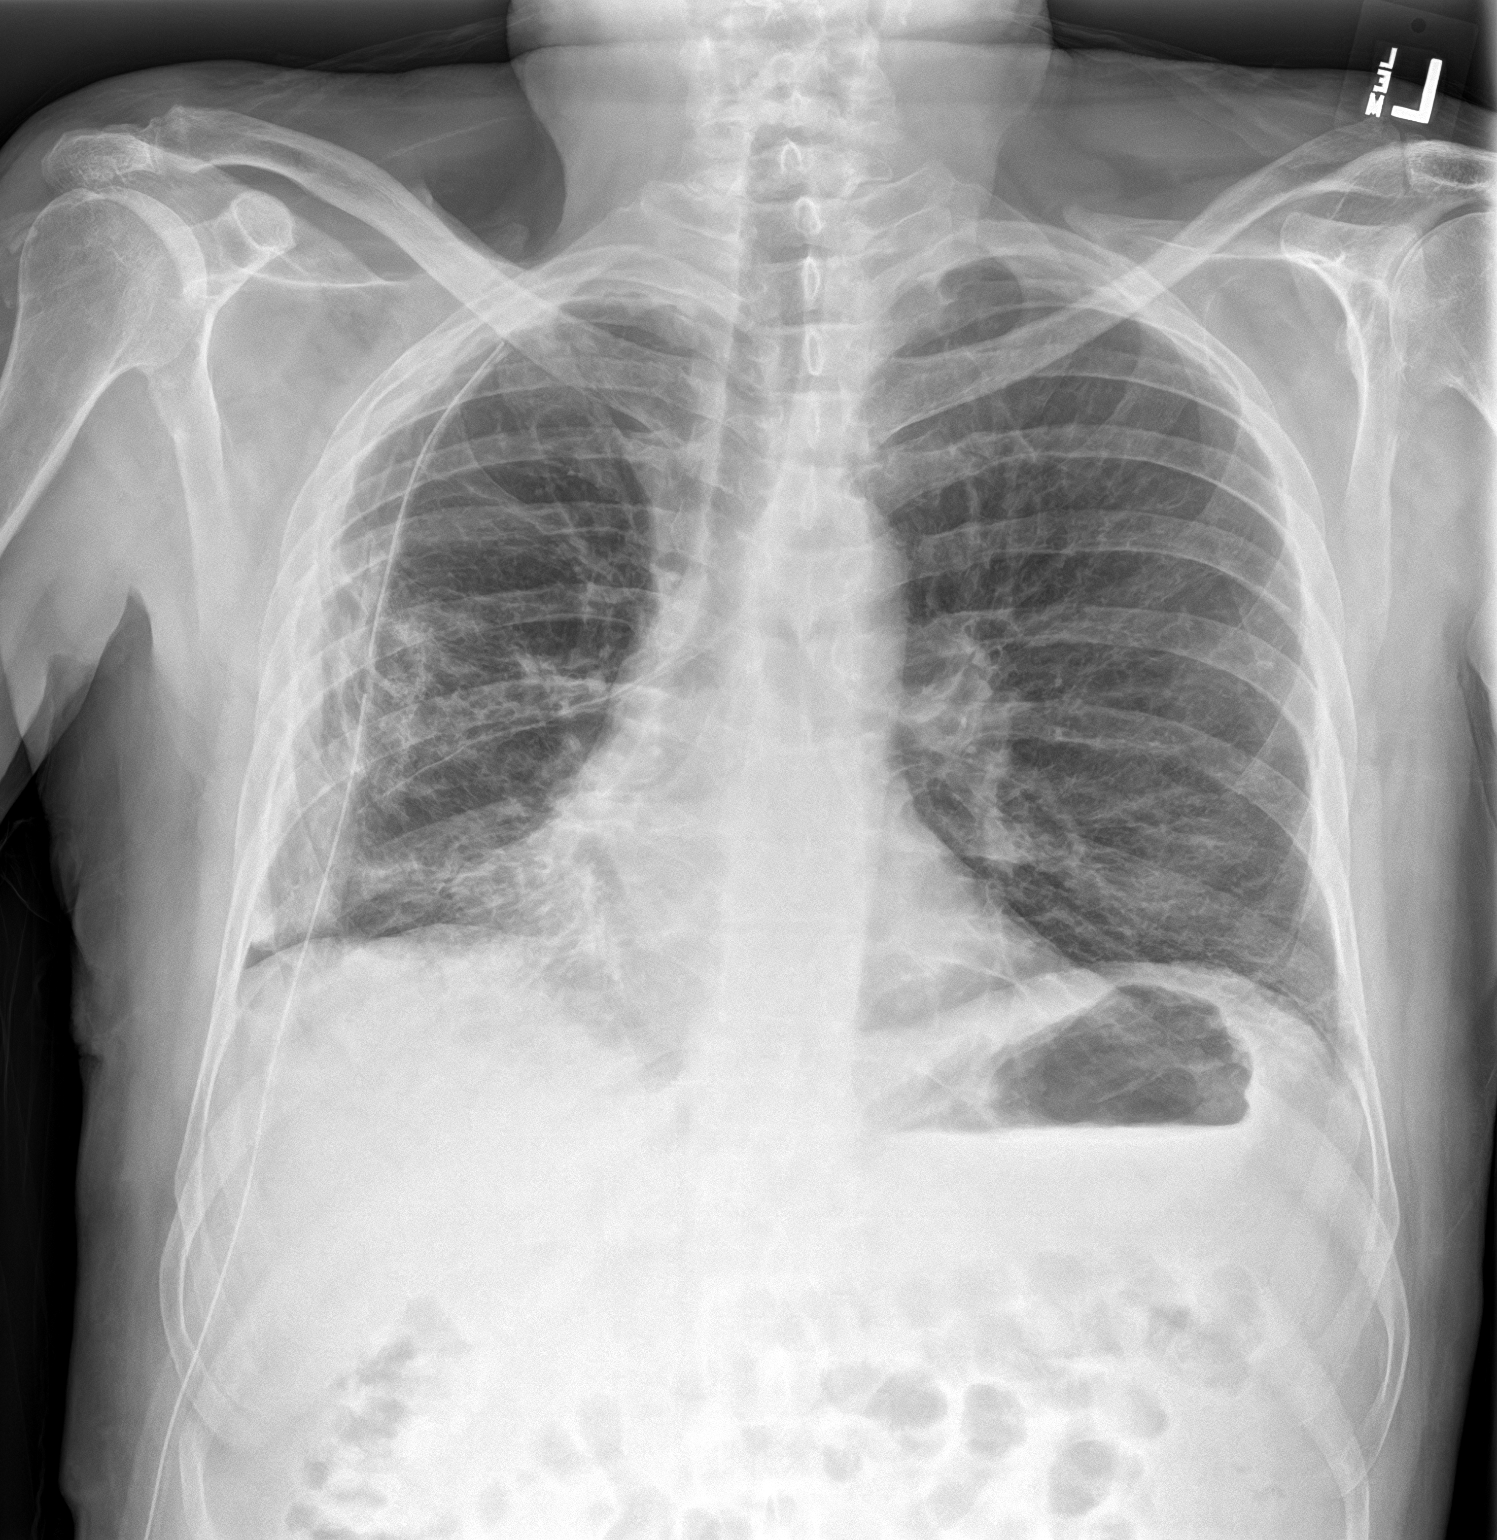
[im 2/2]
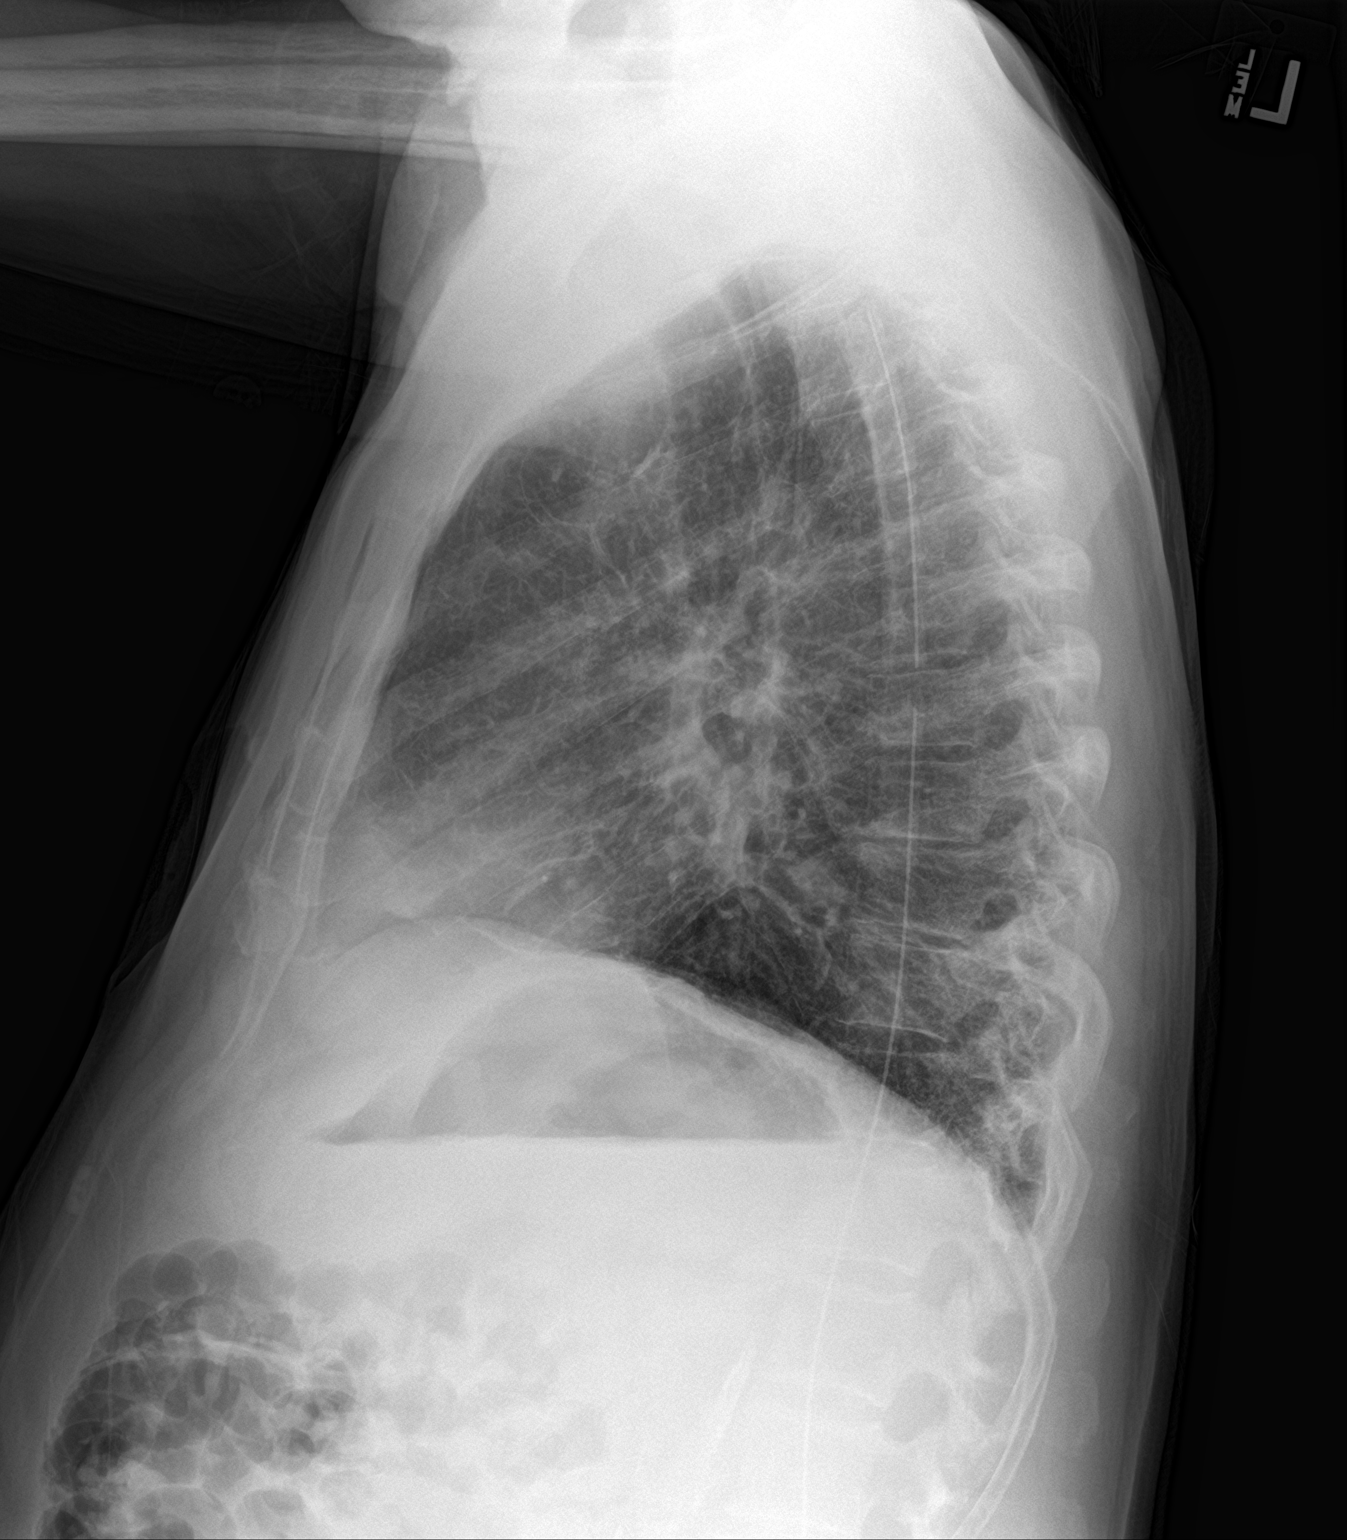

[2 of 2 positions shown; findings below may reference images not displayed]

FINDINGS: Patient has a RIGHT-sided chest tube, tip at the RIGHT lung apex.
There is small RIGHT basilar pneumothorax. Pleural thickening noted
on the RIGHT, stable in appearance. There is minimal RIGHT basilar
opacity. There is perihilar peribronchial thickening bilaterally.
Minimal LEFT LOWER lobe atelectasis.
IMPRESSION: 1. Small RIGHT basilar pneumothorax.
2. Stable bibasilar changes and bronchitic changes.

## 2018-10-22 ENCOUNTER — Encounter: Payer: Self-pay | Admitting: Oncology

## 2018-10-22 ENCOUNTER — Inpatient Hospital Stay (HOSPITAL_BASED_OUTPATIENT_CLINIC_OR_DEPARTMENT_OTHER): Payer: Medicare Other | Admitting: Oncology

## 2018-10-22 ENCOUNTER — Other Ambulatory Visit: Payer: Self-pay

## 2018-10-22 VITALS — BP 141/87 | HR 99 | Temp 96.6°F | Resp 18 | Wt 153.4 lb

## 2018-10-22 DIAGNOSIS — I10 Essential (primary) hypertension: Secondary | ICD-10-CM | POA: Diagnosis not present

## 2018-10-22 DIAGNOSIS — Z87891 Personal history of nicotine dependence: Secondary | ICD-10-CM

## 2018-10-22 DIAGNOSIS — C7A090 Malignant carcinoid tumor of the bronchus and lung: Secondary | ICD-10-CM

## 2018-10-22 DIAGNOSIS — Z7189 Other specified counseling: Secondary | ICD-10-CM

## 2018-10-22 DIAGNOSIS — C7A8 Other malignant neuroendocrine tumors: Secondary | ICD-10-CM

## 2018-10-22 DIAGNOSIS — Z79899 Other long term (current) drug therapy: Secondary | ICD-10-CM | POA: Diagnosis not present

## 2018-10-22 DIAGNOSIS — C3491 Malignant neoplasm of unspecified part of right bronchus or lung: Secondary | ICD-10-CM

## 2018-10-22 NOTE — Progress Notes (Signed)
Hematology/Oncology Follow up note [facility] [phone] [phone]   Patient Care Team: Dion Body, MD as PCP - General (Family Medicine) Telford Nab, RN as Registered Nurse  REFERRING PROVIDER: Dion Body, MD  Lung [place] VISIT Follow up for lung adenocarcinoma.  HISTORY OF PRESENTING ILLNESS:  Joseph Barron is a  67 y.o.  male with PMH listed below who was referred to me for evaluation of lung mass.  Patient had CT chest lung cancer screening done on January 12, 2018 which showed there is a spiculated nodule with a volume derived mean diameter of 15.2 mm, which abuts the pleural surface, highly concerning for primary bronchogenic neoplasm.  He is former smoker, quit recently. 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  Reports mild shortness of breath with climbing stairs otherwise feeling well.  He has a chronic cough productive with clear sputum.  Denies any weight loss, headache, abdominal pain, lower extremity swelling.  Today he is accompanied by his daughter to clinic.  Patient's case was discussed on tumor conference on January 14, 2018.  Given his heavy smoking history/COPD/emphysema, risks excised for biopsy. Consensus decision was to obtain a PET scan, pulmonary lung function testing.  If his lung mass is hypermetabolic on PET scan and no other involvement, he will have surgical evaluation regarding possibilities of resection as well as obtaining diagnosis.  # PET scan  1. 15.2 mm nodule of concern on recent lung cancer screening CT shows no appreciable change in the 8 day interval since that exam. As such, low level hypermetabolism on today's PET study is more likely related to neoplastic etiology than infection/inflammation. No evidence for hypermetabolic hilar or mediastinal lymph nodes. 2. Focal hypermetabolic  FDG accumulation in the distal sigmoid colon with really no marked FDG uptake in the colon elsewhere. CT imaging shows an apparent focal soft tissue lesion at this location. Advanced adenoma or neoplasm could have this appearance. Correlation with colorectal cancer screening history recommended and colonoscopy may be warranted. 3.  Emphysema. (ICD10-J43.9) 4.  Aortic Atherosclerois (ICD10-170.0)  # Biopsy was done at [facility]  On 02/03/2018.  A.Right Lung, Fine Needle Aspiration II (smears and cell block):  Non-small cell carcinoma with histologic changes and immunohistochemical profile consistent with lung adenocarcinoma, moderately differentiated.   Specimen Adequacy:Satisfactory for evaluation.        Note: Floaters identified on cell block.  B.Cytology core biopsy:  Non-small cell carcinoma with histologic changes and  immunohistochemical profile consistent with lung adenocarcinoma,  moderately differentiated.   # 02/23/2018 patient is s/p right thoracotomy and wedge resection. Post surgical course complicated with prolong air leak/pnemothorax.   # Surveillance CT chest abdomen pelvis done on 09/28/2018.  Images were independently reviewed by me and discussed with patient CT is concerning for local recurrence with progressive multinodular thickening in the right upper lobe along the minor fissure and to a lesser extent along the upper margin of major fissure.  INTERVAL HISTORY Joseph Barron is a 67 y.o. 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 During the interval patient has had PET scan and MRI brain done.  Accompanied by multiple family members to clinic to discuss work-up results and management plan. Continue to have chronic shortness of breath, cough.  At baseline.  He is not using any inhaler  or taking any medication Chronic fatigue at baseline..   Review of Systems    Constitutional: Positive for fatigue. Negative for appetite change, chills, fever and unexpected weight change.  HENT:   Negative for hearing loss and voice change.   Eyes: Negative for eye problems and icterus.  Respiratory: Positive for cough and shortness of breath. Negative for chest tightness.   Cardiovascular: Negative for chest pain and leg swelling.  Gastrointestinal: Negative for abdominal distention and abdominal pain.  Endocrine: Negative for hot flashes.  Genitourinary: Negative for difficulty urinating, dysuria and frequency.   Musculoskeletal: Negative for arthralgias.  Skin: Negative for itching and rash.  Neurological: Negative for light-headedness and numbness.  Hematological: Negative for adenopathy. Does not bruise/bleed easily.  Psychiatric/Behavioral: Negative for confusion.     MEDICAL HISTORY:  Past Medical History:  Diagnosis Date  . 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 GERD (gastroesophageal reflux disease)   . Hyperlipemia   . Hypertension   . Non-small cell lung cancer ([place]) 12/2017   surgical resection 02/23/2018.  . Pulmonary nodules 01/13/2018  . Vaccine counseling 12/22/2017    SURGICAL HISTORY: Past Surgical History:  Procedure Laterality Date  . COLONOSCOPY WITH PROPOFOL N/A 02/22/2018   Procedure: COLONOSCOPY WITH PROPOFOL;  Surgeon: Manya Silvas, MD;  Location[facility]COPY;  Service: Endoscopy;  Laterality: N/A;  . EYE SURGERY Left child  . THORACOTOMY/LOBECTOMY Right 02/23/2018   Procedure: THORACOTOMY/ WEDGE RESECTION;  Surgeon: Nestor Lewandowsky, MD;  Location: [facility] ORS;  Service: Thoracic;  Laterality: Right;  Marland Kitchen VIDEO BRONCHOSCOPY N/A 02/23/2018   Procedure: PREOP BRONCHOSCOPY;  Surgeon: Nestor Lewandowsky, MD;  Location: [facility] ORS;  Service: Thoracic;  Laterality: N/A;    SOCIAL HISTORY: Social History   Socioeconomic History  . Marital status: Married    Spouse name: Not on file  . Number of children: Not on file  . Years of  education: Not on file  . Highest education level: Not on file  Occupational History  . Not on file  Social Needs  . Financial resource strain: Not on file  . Food insecurity:    Worry: Not on file    Inability: Not on file  . Transportation needs:    Medical: Not on file    Non-medical: Not on file  Tobacco Use  . Smoking status: Former Smoker    Packs/day: 1.00    Years: 50.00    Pack years: 50.00    Types: Cigarettes    Last attempt to quit: 11/17/2017    Years since quitting: 0.9  . Smokeless tobacco: Former Systems developer    Types: [place] date: 01/15/1989  Substance and Sexual Activity  . Alcohol use: Yes    Alcohol/week: 9.0 standard drinks    Types: 7 Cans of beer, 2 Shots of liquor per week  . Drug use: Never  . Sexual activity: Yes  Lifestyle  . Physical activity:    Days per week: Not on file    Minutes per session: Not on file  . Stress: Not on file  Relationships  . Social connections:    Talks on phone: Not on file    Gets together: Not on file    Attends religious service: Not on file    Active member of club or organization: Not on file    Attends meetings of clubs or organizations: Not on file    Relationship status: Not on file  . Intimate partner violence:    Fear of current or  ex partner: Not on file    Emotionally abused: Not on file    Physically abused: Not on file    Forced sexual activity: Not on file  Other Topics Concern  . Not on file  Social History Narrative  . Not on file    FAMILY HISTORY: Family History  Problem Relation Age of Onset  . Arthritis Mother   . COPD Father   . Coronary artery disease Father   . Transient ischemic attack Father   . Alcoholism Father   . Bowel Disease Father   . COPD Sister   . Prostatitis Brother   . Thyroid disease Sister   . Thyroid disease Sister   . Liver cancer Brother   . Diabetes Brother     ALLERGIES:  has No Known Allergies.  MEDICATIONS:  Current Outpatient Medications    Medication Sig Dispense Refill  . atorvastatin (LIPITOR) 10 MG tablet Take 10 mg by mouth at bedtime.     Marland Kitchen lisinopril (PRINIVIL,ZESTRIL) 20 MG tablet Take 20 mg by mouth daily.    Marland Kitchen omeprazole (PRILOSEC) 40 MG capsule Take by mouth daily.  11   No current facility-administered medications for this visit.      PHYSICAL EXAMINATION: ECOG PERFORMANCE STATUS: 1 - Symptomatic but completely ambulatory Vitals:   10/22/18 1258  BP: (!) 141/87  Pulse: 99  Resp: 18  Temp: (!) 96.6 F (35.9 C)  SpO2: 95%   Filed Weights   10/22/18 1258  Weight: 153 lb 6.4 oz (69.6 kg)    Physical Exam  Constitutional: He is oriented to person, place, and time. No distress.  HENT:  Head: Normocephalic and atraumatic.  Nose: Nose normal.  Mouth/Throat: Oropharynx is clear and moist. No oropharyngeal exudate.  Eyes: Pupils are equal, round, and reactive to light. EOM are normal. Right eye exhibits no discharge. Left eye exhibits no discharge. No scleral icterus.  Neck: Normal range of motion. Neck supple. No JVD present.  Cardiovascular: Normal rate, regular rhythm and normal heart sounds.  No murmur heard. Pulmonary/Chest: Effort normal. No respiratory distress. He has no wheezes. He has no rales. He exhibits no tenderness.  Decreased breath sound bilaterally.   Abdominal: Soft. Bowel sounds are normal. He exhibits no distension. There is no abdominal tenderness.  Musculoskeletal: Normal range of motion.        General: No tenderness or edema.  Lymphadenopathy:    He has no cervical adenopathy.  Neurological: He is alert and oriented to person, place, and time. No cranial nerve deficit. He exhibits normal muscle tone. Coordination normal.  Skin: Skin is warm and dry. No rash noted. He is not diaphoretic. No erythema.  Psychiatric: Affect and judgment normal.     LABORATORY DATA:  I have reviewed the data as listed Lab Results  Component Value Date   WBC 11.5 (H) 09/30/2018   HGB 13.1  09/30/2018   HCT 40.2 09/30/2018   MCV 91.4 09/30/2018   PLT 317 09/30/2018   Recent Labs    03/10/18 0414 03/11/18 0413 06/25/18 0921 07/01/18 1348 09/30/18 1437  NA 138 137  --  138 138  K 4.9 4.6  --  4.4 4.6  CL 101 99*  --  107 103  CO2 30 29  --  23 26  GLUCOSE 104* 117*  --  126* 94  BUN 49* 35*  --  17 14  CREATININE 1.31* 1.14 1.20 1.29* 1.10  CALCIUM 9.5 9.6  --  9.3 9.9  GFRNONAA  54* >60  --  55* >60  GFRAA >60 >60  --  >60 >60  PROT 7.3  --   --  7.7 8.1  ALBUMIN 3.1*  --   --  3.6 3.9  AST 53*  --   --  27 27  ALT 74*  --   --  27 28  ALKPHOS 134*  --   --  90 98  BILITOT 0.5  --   --  0.3 0.5   Surgical Pathology  CASE: [id number]  PATIENT: Luchiano Stimson  Surgical Pathology Report  DIAGNOSIS:  A. LUNG, RIGHT UPPER LOBE; WEDGE RESECTION:  - LARGE CELL NEUROENDOCRINE CARCINOMA.  - SEE CANCER SUMMARY BELOW.  - CHANGE CONSISTENT WITH PRIOR FNA.  - REACTIVE SUBPLEURAL SMOOTH MUSCLE HYPERPLASIA.   B. PLEURA, RIGHT; BIOPSY:  - HYALINE PLEURAL PLAQUE WITH FOCAL REACTIVE MESOTHELIAL HYPERPLASIA.  - NEGATIVE FOR MALIGNANCY.   RADIOGRAPHIC STUDIES: I have personally reviewed the radiological images as listed and agreed with the findings in the report. CT chest with contrast 06/25/2018 1. Postoperative scarring and volume loss in the right hemithorax. 7 mm nodule along the minor fissure is difficult to compare with the prior study given differences in parenchymal architecture on the current study. Attention on followup exams is warranted. This examination will serve as baseline for future comparison. 2. Borderline enlarged bilateral hilar lymph nodes, nonspecific. 3. Thin rind of pleural fluid and thickening in the right hemithorax. 4.  Aortic atherosclerosis (ICD10-170.0). 5.  Emphysema (ICD10-J43.9).   ASSESSMENT & PLAN:  1. Large cell neuroendocrine carcinoma ([place])   2. Primary adenocarcinoma of right lung ([place])   3. Goals of care,  counseling/discussion    #Large cell neuroendocrine carcinoma, declined adjuvant chemotherapy and radiation. Now with local recurrence. MR brain was independent reviewed and discussed with patient and her family members.  No intracranial metastasis. PET scan was independently reviewed and discussed with patient and her remembers.  PET scan findings are consistent with recurrent tumor along the surgical resection bed in the right upper lobe.  There is also nodularity in this tissue thickening more posteriorly and inferiorly which is likely interstitial or endobronchial spread of the tumor. I have discussed patient's case with Dr. Genevive Bi who does not think the patient is a surgical candidate for repeat resectio.  I discussed with patient about definitive treatment with concurrent chemo and radiation.  Patient is not interested in any systemic chemotherapy or immunotherapy. He is open to have an appointment to patient care with radiation oncology.  He may agrees to radiation. We will refer patient to radiation oncology. Patient asks about his life expectancy if he chooses not to do any anticancer treatments.  Discussed with patient that large cell neuroendocrine carcinoma is a very aggressive type of cancer, carries poor prognosis with average life expectancy of 3 to 6 months. Patient reports feeling pretty well currently.  Not interested to talk to palliative care.  We will continue discussion at next visit. We spent sufficient time to discuss many aspect of care, questions were answered to patient's satisfaction. The patient knows to call the clinic with any problems questions or concerns.  Return of visit: 2 months.   Total face to face encounter time for this patient visit was 25 min. >50% of the time was  spent in counseling and coordination of care.   Earlie Server, MD, PhD Hematology Oncology [facility] Pager- [phone] 10/22/18

## 2018-10-22 NOTE — Progress Notes (Signed)
Patient here for MRI and PET results.

## 2018-10-25 IMAGING — CR DG CHEST 2V
1 series · 2 of 2 positions shown · non-contrast
Comparison: CP chest x-ray 03/01/2018

CLINICAL DATA: Resection right upper lobe neuroendocrine tumor.
Follow-up pneumothorax.

EXAM:
CHEST - 2 VIEW

[Series 1: w chest pa · 0.14mm/px · 2 of 2 slices shown]
[im 1/2]
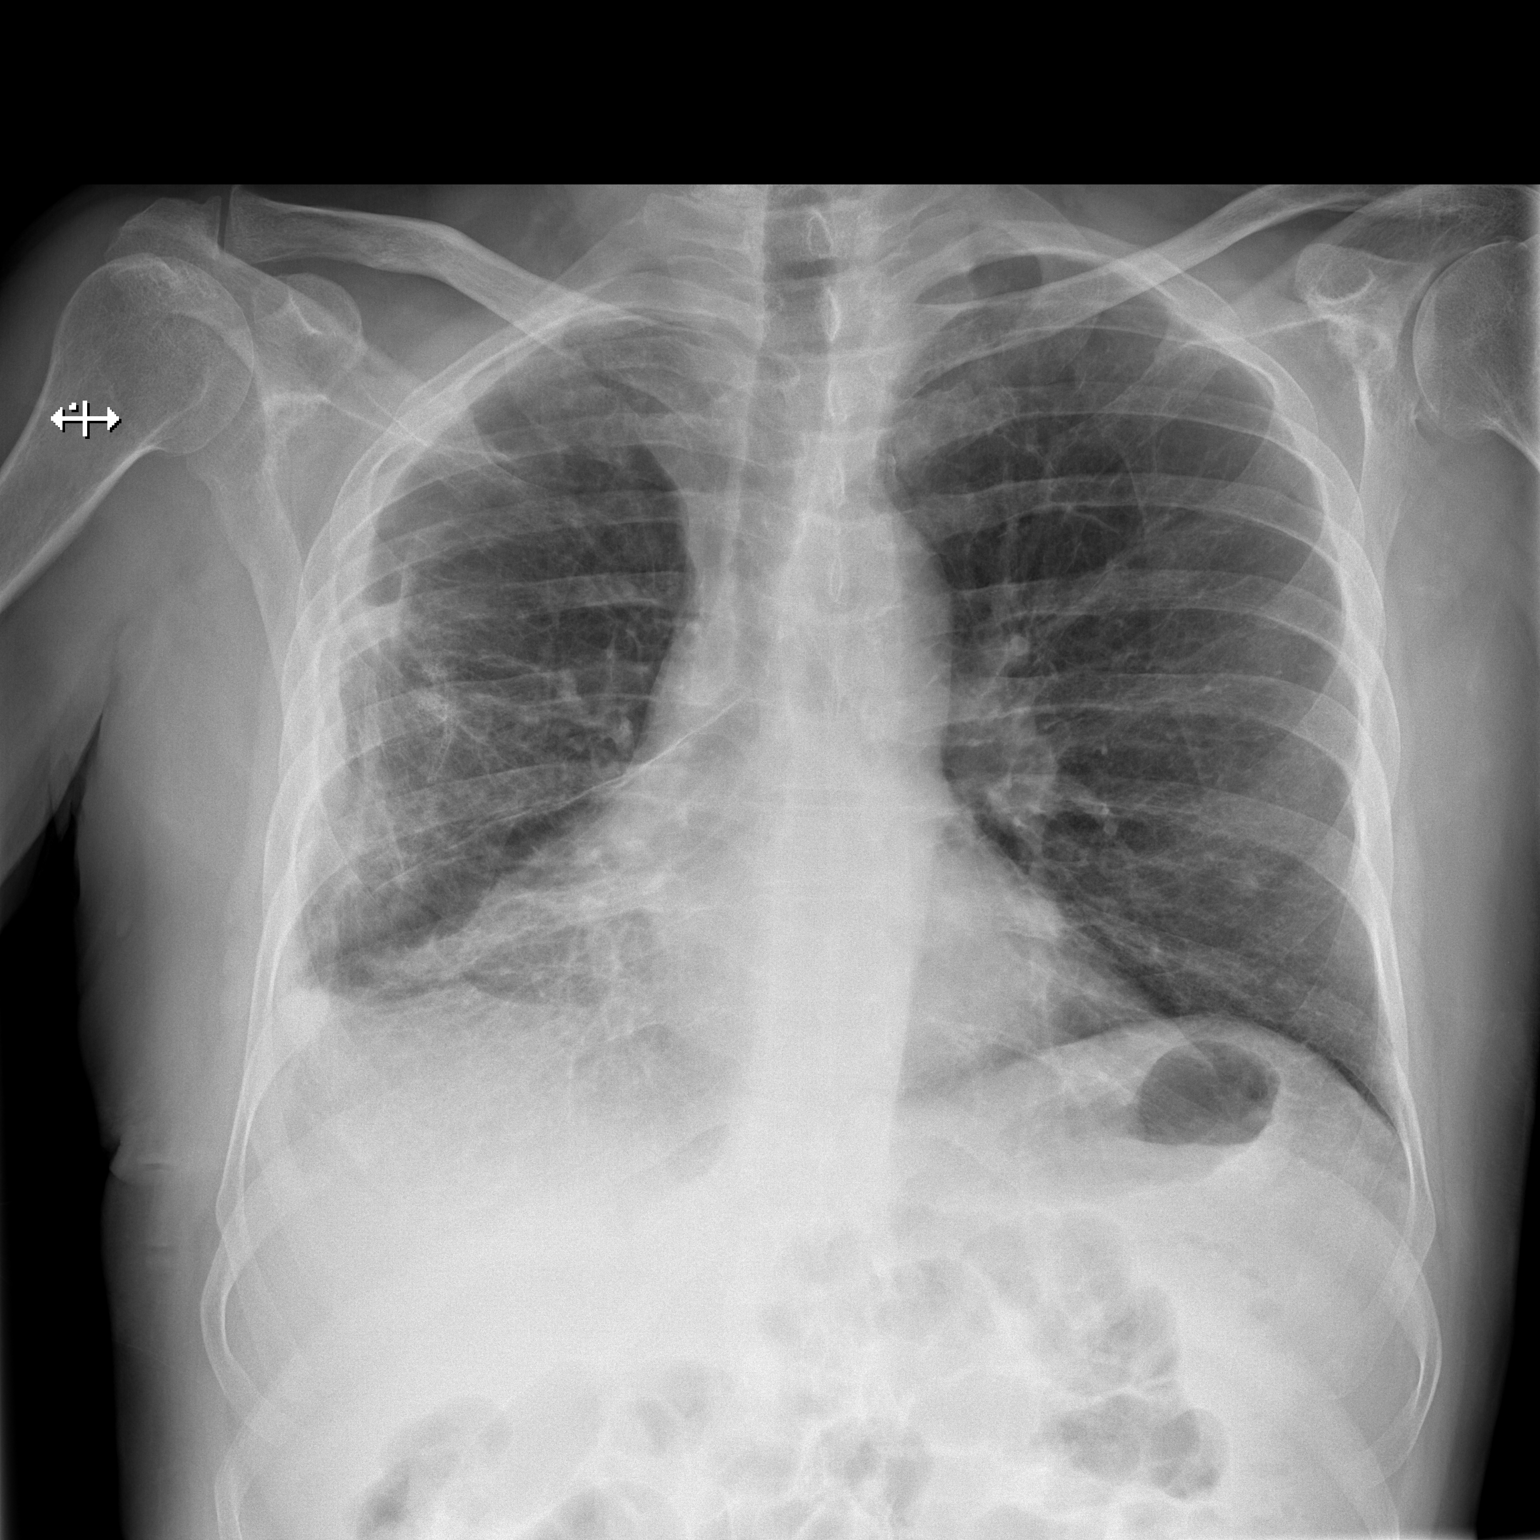
[im 2/2]
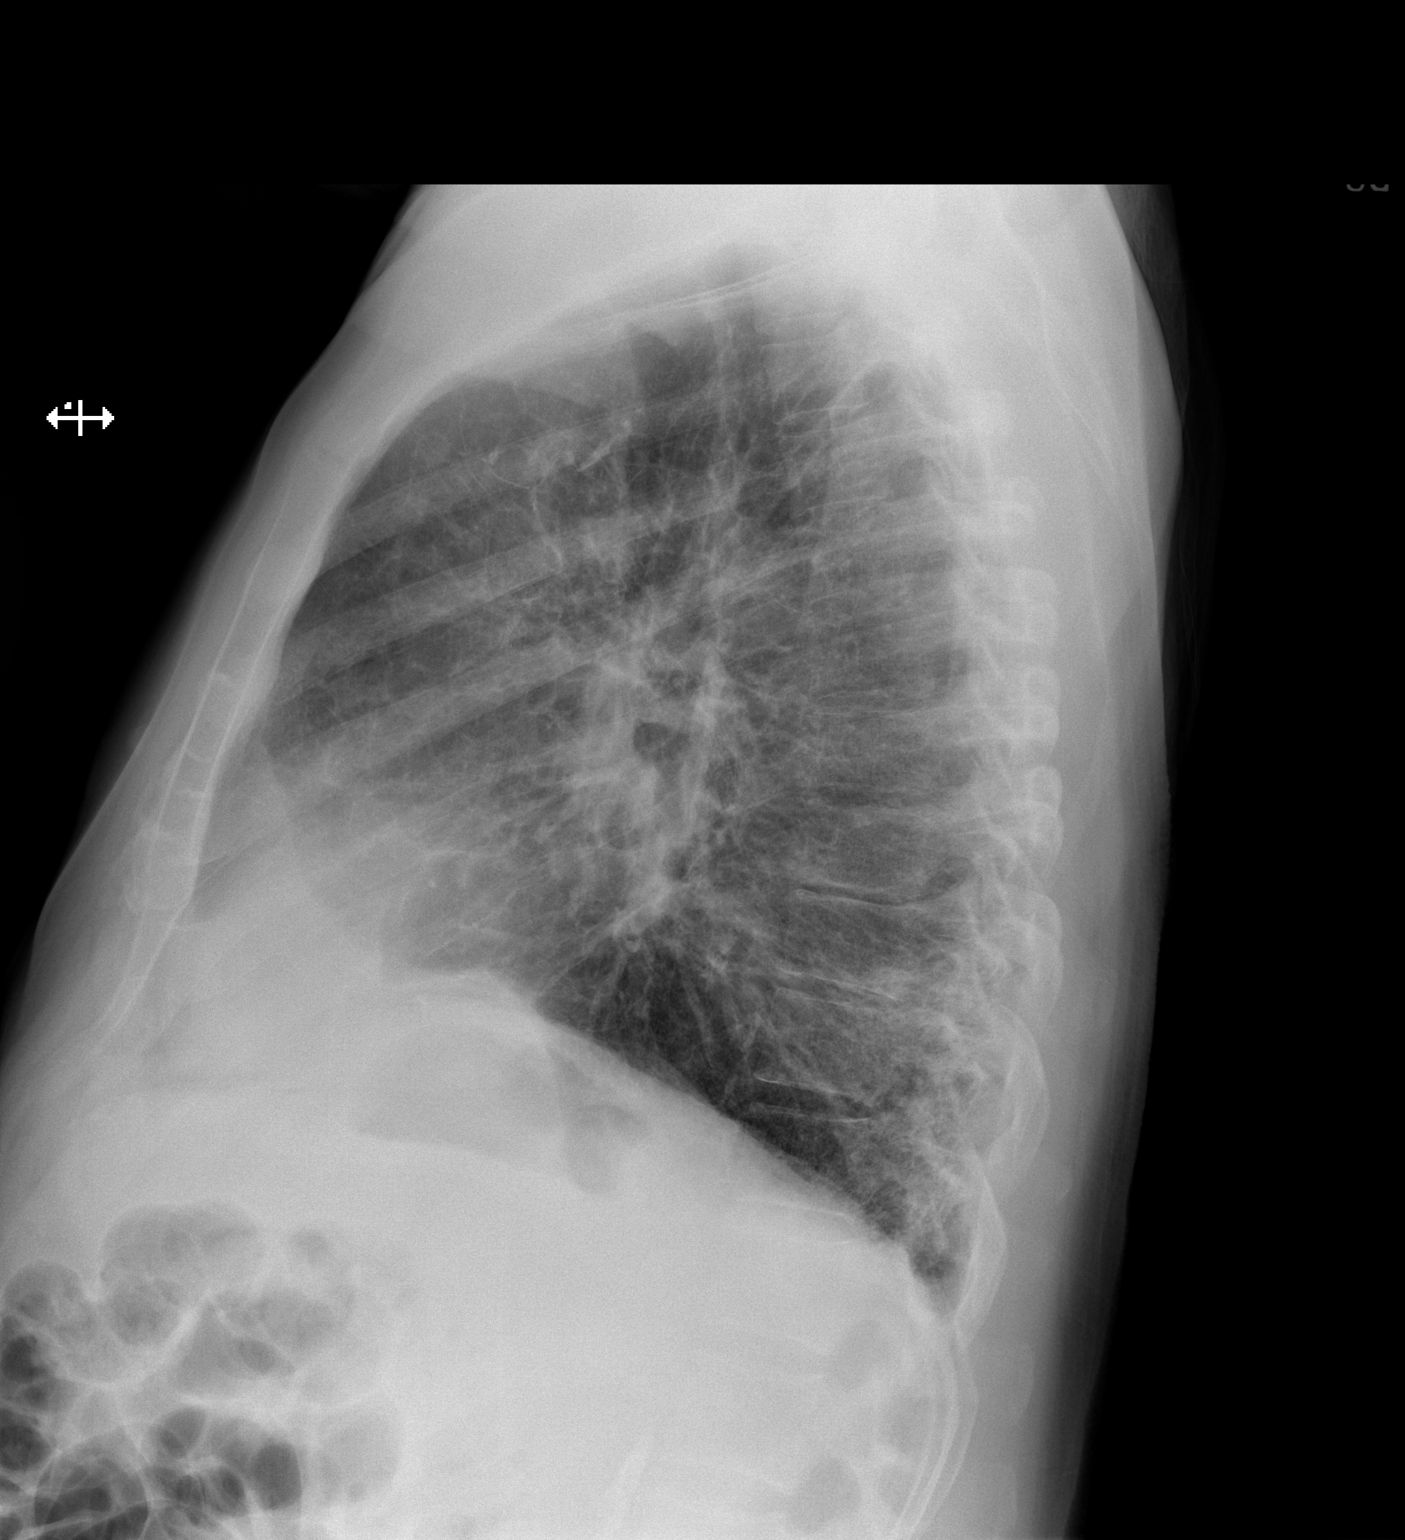

[2 of 2 positions shown; findings below may reference images not displayed]

FINDINGS: The right-sided chest tube has been removed. Heart size normal.
Lateral scarring is present. There is no residual pneumothorax.
Blunting of the right hemithorax or small effusion is again noted.
No definite pneumothorax remains. Left hemithorax is clear.
Degenerative changes are noted at the shoulders.
IMPRESSION: 1. Interval removal of right-sided chest tube without definite
pneumothorax.
2. Right lateral scarring.
3. Right pleural thickening or small pleural effusion.

## 2018-11-10 ENCOUNTER — Ambulatory Visit
Admission: RE | Admit: 2018-11-10 | Discharge: 2018-11-10 | Disposition: A | Discharge status: Home / Self Care | Payer: Medicare Other | Source: Ambulatory Visit | Attending: Radiation Oncology | Admitting: Radiation Oncology

## 2018-11-10 ENCOUNTER — Other Ambulatory Visit: Payer: Self-pay

## 2018-11-10 ENCOUNTER — Encounter: Payer: Self-pay | Admitting: Radiation Oncology

## 2018-11-10 VITALS — BP 149/94 | HR 97 | Temp 98.4°F | Resp 16 | Ht 69.0 in | Wt 156.9 lb

## 2018-11-10 DIAGNOSIS — K219 Gastro-esophageal reflux disease without esophagitis: Secondary | ICD-10-CM | POA: Insufficient documentation

## 2018-11-10 DIAGNOSIS — C7A8 Other malignant neuroendocrine tumors: Secondary | ICD-10-CM

## 2018-11-10 DIAGNOSIS — Z800 Family history of malignant neoplasm of digestive organs: Secondary | ICD-10-CM | POA: Insufficient documentation

## 2018-11-10 DIAGNOSIS — Z87891 Personal history of nicotine dependence: Secondary | ICD-10-CM | POA: Diagnosis not present

## 2018-11-10 DIAGNOSIS — I10 Essential (primary) hypertension: Secondary | ICD-10-CM | POA: Diagnosis not present

## 2018-11-10 DIAGNOSIS — C3411 Malignant neoplasm of upper lobe, right bronchus or lung: Secondary | ICD-10-CM | POA: Insufficient documentation

## 2018-11-10 DIAGNOSIS — E785 Hyperlipidemia, unspecified: Secondary | ICD-10-CM | POA: Diagnosis not present

## 2018-11-10 DIAGNOSIS — R0602 Shortness of breath: Secondary | ICD-10-CM | POA: Insufficient documentation

## 2018-11-10 DIAGNOSIS — Z79899 Other long term (current) drug therapy: Secondary | ICD-10-CM | POA: Insufficient documentation

## 2018-11-10 DIAGNOSIS — C3491 Malignant neoplasm of unspecified part of right bronchus or lung: Secondary | ICD-10-CM

## 2018-11-10 NOTE — Consult Note (Signed)
NEW PATIENT EVALUATION  Name: Joseph Barron  MRN: [id number]  Date:   11/10/2018     DOB: 02-Mar-1951   This 68 y.o. 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 Lung Cancer    DIAGNOSIS: The primary encounter diagnosis was Bronchogenic cancer of right lung ([place]). A diagnosis of Large cell neuroendocrine carcinoma ([place]) was also pertinent to this visit.   PREVIOUS INVESTIGATIONS:  CT scans and PET/CT scans reviewed Pathology reports reviewed Clinical notes reviewed  HPI: patient is a 68 year old male with the time of screening CT scan of his chest back in early 2019 showed a spiculated nodule measuring15 mm abutting the pleural surfacein the right upper lobe. PET CT scan showing hypermetabolic activity in this lesion.needle biopsy was positive for non-small cell lung cancer he underwent wedge resection of right upper lobe which showed a large cell neuroendocrine carcinomameasuring 1.4 cm with margins clear.patient has been under CT surveillance and recent CT scan performed in December 2019 were concerning for local recurrence with multinodular thickening in the right upper lobe along the minor fissure and to a lesser extent along the upper margin of the major fissure. PET CT scan also been straight hypermetabolic activity in this region consistent with recurrent disease. MRI of brain was unremarkable for metastatic disease. Patient continues to have some chronic shortness of breath cough no hemoptysis. He does not use any inhalers.patient was reevaluated by Dr. Faith Rogue do not think further surgery is indicated. Patient is not interested in systemic chemotherapy or immunotherapy. He is seen today for consideration of radiation therapy for salvage. Primary  symptoms as stated above.  PLANNED TREATMENT REGIMEN: I MRT radiation therapy  PAST MEDICAL HISTORY:  has a past medical history of Borderline diabetes mellitus (08/06/2017), GERD (gastroesophageal reflux disease), Hyperlipemia, Hypertension, Non-small cell lung cancer ([place]) (12/2017), Pulmonary nodules (01/13/2018), and Vaccine counseling (12/22/2017).    PAST SURGICAL HISTORY:  Past Surgical History:  Procedure Laterality Date  . COLONOSCOPY WITH PROPOFOL N/A 02/22/2018   Procedure: COLONOSCOPY WITH PROPOFOL;  Surgeon: Manya Silvas, MD;  Location: [facility] ENDOSCOPY;  Service: Endoscopy;  Laterality: N/A;  . EYE SURGERY Left child  . THORACOTOMY/LOBECTOMY Right 02/23/2018   Procedure: THORACOTOMY/ WEDGE RESECTION;  Surgeon: Nestor Lewandowsky, MD;  Location: [facility] ORS;  Service: Thoracic;  Laterality: Right;  Marland Kitchen VIDEO BRONCHOSCOPY N/A 02/23/2018   Procedure: PREOP BRONCHOSCOPY;  Surgeon: Nestor Lewandowsky, MD;  Location: [facility] ORS;  Service: Thoracic;  Laterality: N/A;    FAMILY HISTORY: family history includes Alcoholism in his father; Arthritis in his mother; Bowel Disease in his father; COPD in his father and sister; Coronary artery disease in his father; Diabetes in his brother; Liver cancer in his brother; Prostatitis in his brother; Thyroid disease in his sister and sister; Transient ischemic attack in his father.  SOCIAL HISTORY:  reports that he quit smoking about a year ago. His smoking use included cigarettes. He has a 50.00 pack-year smoking history. He quit smokeless tobacco use about 29 years ago.  His smokeless tobacco use included chew. He reports current alcohol use of about 9.0 standard drinks of alcohol per week. He reports that he does not use drugs.  ALLERGIES: Patient has no known allergies.  MEDICATIONS:  Current Outpatient Medications  Medication Sig Dispense Refill  .  atorvastatin (LIPITOR) 10 MG tablet Take 10 mg by mouth at bedtime.     Marland Kitchen lisinopril (PRINIVIL,ZESTRIL) 20 MG  tablet Take 20 mg by mouth daily.    Marland Kitchen omeprazole (PRILOSEC) 40 MG capsule Take by mouth daily.  11   No current facility-administered medications for this encounter.     ECOG PERFORMANCE STATUS:  1 - Symptomatic but completely ambulatory  REVIEW OF SYSTEMS: except for the cough fatigue and some atypical pain in his right anterior chest Patient denies any weight loss, fatigue, weakness, fever, chills or night sweats. Patient denies any loss of vision, blurred vision. Patient denies any ringing  of the ears or hearing loss. No irregular heartbeat. Patient denies heart murmur or history of fainting. Patient denies any chest pain or pain radiating to her upper extremities. Patient denies any shortness of breath, difficulty breathing at night, cough or hemoptysis. Patient denies any swelling in the lower legs. Patient denies any nausea vomiting, vomiting of blood, or coffee ground material in the vomitus. Patient denies any stomach pain. Patient states has had normal bowel movements no significant constipation or diarrhea. Patient denies any dysuria, hematuria or significant nocturia. Patient denies any problems walking, swelling in the joints or loss of balance. Patient denies any skin changes, loss of hair or loss of weight. Patient denies any excessive worrying or anxiety or significant depression. Patient denies any problems with insomnia. Patient denies excessive thirst, polyuria, polydipsia. Patient denies any swollen glands, patient denies easy bruising or easy bleeding. Patient denies any recent infections, allergies or URI. Patient "s visual fields have not changed significantly in recent time.    PHYSICAL EXAM: BP (!) 149/94 (BP Location: Left Arm, Patient Position: Sitting)   Pulse 97   Temp 98.4 F (36.9 C) (Tympanic)   Resp 16   Ht 5\' 9"  (1.753 m)   Wt 156 lb 13.7 oz (71.2 kg)   BMI 23.16 kg/m  Well-developed well-nourished patient in NAD. HEENT reveals PERLA, EOMI, discs not  visualized.  Oral cavity is clear. No oral mucosal lesions are identified. Neck is clear without evidence of cervical or supraclavicular adenopathy. Lungs are clear to A&P. Cardiac examination is essentially unremarkable with regular rate and rhythm without murmur rub or thrill. Abdomen is benign with no organomegaly or masses noted. Motor sensory and DTR levels are equal and symmetric in the upper and lower extremities. Cranial nerves II through XII are grossly intact. Proprioception is intact. No peripheral adenopathy or edema is identified. No motor or sensory levels are noted. Crude visual fields are within normal range.  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 I would use PET CT fusion study to delineate my tumor volume. I would choose I MRT to avoid critical structures such as normal lung volume heart esophagus and spinal cord. Risks and benefits of treatment including skin reaction fatigue alteration of blood counts possible radiation esophagitis and skin reaction all were described in detail to the patient. He seems to comprehend my treatment plan well. I have personally set up and ordered CT simulation for early next week. Patient seems to comprehend my treatment plan well.  I would like to take  this opportunity to thank you for allowing me to participate in the care of your patient.Noreene Filbert, MD

## 2018-11-12 ENCOUNTER — Ambulatory Visit: Payer: Medicare Other

## 2018-11-15 ENCOUNTER — Ambulatory Visit
Admission: RE | Admit: 2018-11-15 | Discharge: 2018-11-15 | Disposition: A | Discharge status: Home / Self Care | Payer: Medicare Other | Source: Ambulatory Visit | Attending: Radiation Oncology | Admitting: Radiation Oncology

## 2018-11-15 DIAGNOSIS — Z510 Encounter for antineoplastic radiation therapy: Secondary | ICD-10-CM | POA: Insufficient documentation

## 2018-11-15 DIAGNOSIS — C7A090 Malignant carcinoid tumor of the bronchus and lung: Secondary | ICD-10-CM | POA: Insufficient documentation

## 2018-11-17 DIAGNOSIS — Z510 Encounter for antineoplastic radiation therapy: Secondary | ICD-10-CM | POA: Diagnosis not present

## 2018-11-19 ENCOUNTER — Other Ambulatory Visit: Payer: Self-pay | Admitting: *Deleted

## 2018-11-19 DIAGNOSIS — C7A8 Other malignant neuroendocrine tumors: Secondary | ICD-10-CM

## 2018-11-24 ENCOUNTER — Ambulatory Visit
Admission: RE | Admit: 2018-11-24 | Discharge: 2018-11-24 | Disposition: A | Discharge status: Home / Self Care | Payer: Medicare Other | Source: Ambulatory Visit | Attending: Radiation Oncology | Admitting: Radiation Oncology

## 2018-11-25 ENCOUNTER — Ambulatory Visit
Admission: RE | Admit: 2018-11-25 | Discharge: 2018-11-25 | Disposition: A | Discharge status: Home / Self Care | Payer: Medicare Other | Source: Ambulatory Visit | Attending: Radiation Oncology | Admitting: Radiation Oncology

## 2018-11-25 DIAGNOSIS — Z510 Encounter for antineoplastic radiation therapy: Secondary | ICD-10-CM | POA: Diagnosis not present

## 2018-11-26 ENCOUNTER — Ambulatory Visit
Admission: RE | Admit: 2018-11-26 | Discharge: 2018-11-26 | Disposition: A | Discharge status: Home / Self Care | Payer: Medicare Other | Source: Ambulatory Visit | Attending: Radiation Oncology | Admitting: Radiation Oncology

## 2018-11-26 DIAGNOSIS — Z510 Encounter for antineoplastic radiation therapy: Secondary | ICD-10-CM | POA: Diagnosis not present

## 2018-11-29 ENCOUNTER — Ambulatory Visit
Admission: RE | Admit: 2018-11-29 | Discharge: 2018-11-29 | Disposition: A | Discharge status: Home / Self Care | Payer: Medicare Other | Source: Ambulatory Visit | Attending: Radiation Oncology | Admitting: Radiation Oncology

## 2018-11-29 DIAGNOSIS — C7A090 Malignant carcinoid tumor of the bronchus and lung: Secondary | ICD-10-CM | POA: Diagnosis not present

## 2018-11-29 DIAGNOSIS — Z510 Encounter for antineoplastic radiation therapy: Secondary | ICD-10-CM | POA: Insufficient documentation

## 2018-11-30 ENCOUNTER — Ambulatory Visit
Admission: RE | Admit: 2018-11-30 | Discharge: 2018-11-30 | Disposition: A | Discharge status: Home / Self Care | Payer: Medicare Other | Source: Ambulatory Visit | Attending: Radiation Oncology | Admitting: Radiation Oncology

## 2018-11-30 DIAGNOSIS — Z510 Encounter for antineoplastic radiation therapy: Secondary | ICD-10-CM | POA: Diagnosis not present

## 2018-12-01 ENCOUNTER — Ambulatory Visit
Admission: RE | Admit: 2018-12-01 | Discharge: 2018-12-01 | Disposition: A | Discharge status: Home / Self Care | Payer: Medicare Other | Source: Ambulatory Visit | Attending: Radiation Oncology | Admitting: Radiation Oncology

## 2018-12-01 DIAGNOSIS — Z510 Encounter for antineoplastic radiation therapy: Secondary | ICD-10-CM | POA: Diagnosis not present

## 2018-12-02 ENCOUNTER — Ambulatory Visit
Admission: RE | Admit: 2018-12-02 | Discharge: 2018-12-02 | Disposition: A | Discharge status: Home / Self Care | Payer: Medicare Other | Source: Ambulatory Visit | Attending: Radiation Oncology | Admitting: Radiation Oncology

## 2018-12-02 DIAGNOSIS — Z510 Encounter for antineoplastic radiation therapy: Secondary | ICD-10-CM | POA: Diagnosis not present

## 2018-12-03 ENCOUNTER — Ambulatory Visit
Admission: RE | Admit: 2018-12-03 | Discharge: 2018-12-03 | Disposition: A | Discharge status: Home / Self Care | Payer: Medicare Other | Source: Ambulatory Visit | Attending: Radiation Oncology | Admitting: Radiation Oncology

## 2018-12-03 DIAGNOSIS — Z510 Encounter for antineoplastic radiation therapy: Secondary | ICD-10-CM | POA: Diagnosis not present

## 2018-12-06 ENCOUNTER — Ambulatory Visit
Admission: RE | Admit: 2018-12-06 | Discharge: 2018-12-06 | Disposition: A | Discharge status: Home / Self Care | Payer: Medicare Other | Source: Ambulatory Visit | Attending: Radiation Oncology | Admitting: Radiation Oncology

## 2018-12-06 DIAGNOSIS — Z510 Encounter for antineoplastic radiation therapy: Secondary | ICD-10-CM | POA: Diagnosis not present

## 2018-12-07 ENCOUNTER — Ambulatory Visit
Admission: RE | Admit: 2018-12-07 | Discharge: 2018-12-07 | Disposition: A | Discharge status: Home / Self Care | Payer: Medicare Other | Source: Ambulatory Visit | Attending: Radiation Oncology | Admitting: Radiation Oncology

## 2018-12-07 DIAGNOSIS — Z510 Encounter for antineoplastic radiation therapy: Secondary | ICD-10-CM | POA: Diagnosis not present

## 2018-12-08 ENCOUNTER — Ambulatory Visit
Admission: RE | Admit: 2018-12-08 | Discharge: 2018-12-08 | Disposition: A | Discharge status: Home / Self Care | Payer: Medicare Other | Source: Ambulatory Visit | Attending: Radiation Oncology | Admitting: Radiation Oncology

## 2018-12-08 ENCOUNTER — Other Ambulatory Visit: Payer: Self-pay

## 2018-12-08 ENCOUNTER — Inpatient Hospital Stay: Payer: Medicare Other | Attending: Radiation Oncology

## 2018-12-08 DIAGNOSIS — I10 Essential (primary) hypertension: Secondary | ICD-10-CM | POA: Insufficient documentation

## 2018-12-08 DIAGNOSIS — C3411 Malignant neoplasm of upper lobe, right bronchus or lung: Secondary | ICD-10-CM | POA: Diagnosis present

## 2018-12-08 DIAGNOSIS — Z87891 Personal history of nicotine dependence: Secondary | ICD-10-CM | POA: Diagnosis not present

## 2018-12-08 DIAGNOSIS — Z79899 Other long term (current) drug therapy: Secondary | ICD-10-CM | POA: Diagnosis not present

## 2018-12-08 DIAGNOSIS — J439 Emphysema, unspecified: Secondary | ICD-10-CM | POA: Insufficient documentation

## 2018-12-08 DIAGNOSIS — E785 Hyperlipidemia, unspecified: Secondary | ICD-10-CM | POA: Diagnosis not present

## 2018-12-08 DIAGNOSIS — Z923 Personal history of irradiation: Secondary | ICD-10-CM | POA: Insufficient documentation

## 2018-12-08 DIAGNOSIS — C7A8 Other malignant neuroendocrine tumors: Secondary | ICD-10-CM

## 2018-12-08 DIAGNOSIS — E119 Type 2 diabetes mellitus without complications: Secondary | ICD-10-CM | POA: Insufficient documentation

## 2018-12-08 DIAGNOSIS — Z510 Encounter for antineoplastic radiation therapy: Secondary | ICD-10-CM | POA: Diagnosis not present

## 2018-12-08 LAB — CBC
HCT: 40.9 % (ref 39.0–52.0)
Hemoglobin: 13.7 g/dL (ref 13.0–17.0)
MCH: 30.7 pg (ref 26.0–34.0)
MCHC: 33.5 g/dL (ref 30.0–36.0)
MCV: 91.7 fL (ref 80.0–100.0)
Platelets: 285 10*3/uL (ref 150–400)
RBC: 4.46 MIL/uL (ref 4.22–5.81)
RDW: 13.4 % (ref 11.5–15.5)
WBC: 7.7 10*3/uL (ref 4.0–10.5)
nRBC: 0 % (ref 0.0–0.2)

## 2018-12-09 ENCOUNTER — Ambulatory Visit
Admission: RE | Admit: 2018-12-09 | Discharge: 2018-12-09 | Disposition: A | Discharge status: Home / Self Care | Payer: Medicare Other | Source: Ambulatory Visit | Attending: Radiation Oncology | Admitting: Radiation Oncology

## 2018-12-09 DIAGNOSIS — Z510 Encounter for antineoplastic radiation therapy: Secondary | ICD-10-CM | POA: Diagnosis not present

## 2018-12-10 ENCOUNTER — Ambulatory Visit
Admission: RE | Admit: 2018-12-10 | Discharge: 2018-12-10 | Disposition: A | Discharge status: Home / Self Care | Payer: Medicare Other | Source: Ambulatory Visit | Attending: Radiation Oncology | Admitting: Radiation Oncology

## 2018-12-10 DIAGNOSIS — Z510 Encounter for antineoplastic radiation therapy: Secondary | ICD-10-CM | POA: Diagnosis not present

## 2018-12-13 ENCOUNTER — Ambulatory Visit
Admission: RE | Admit: 2018-12-13 | Discharge: 2018-12-13 | Disposition: A | Discharge status: Home / Self Care | Payer: Medicare Other | Source: Ambulatory Visit | Attending: Radiation Oncology | Admitting: Radiation Oncology

## 2018-12-13 DIAGNOSIS — Z510 Encounter for antineoplastic radiation therapy: Secondary | ICD-10-CM | POA: Diagnosis not present

## 2018-12-14 ENCOUNTER — Ambulatory Visit
Admission: RE | Admit: 2018-12-14 | Discharge: 2018-12-14 | Disposition: A | Discharge status: Home / Self Care | Payer: Medicare Other | Source: Ambulatory Visit | Attending: Radiation Oncology | Admitting: Radiation Oncology

## 2018-12-14 ENCOUNTER — Other Ambulatory Visit: Payer: Self-pay | Admitting: *Deleted

## 2018-12-14 DIAGNOSIS — Z510 Encounter for antineoplastic radiation therapy: Secondary | ICD-10-CM | POA: Diagnosis not present

## 2018-12-14 MED ORDER — SUCRALFATE 1 G PO TABS: 1.0000 g | ORAL_TABLET | Freq: Three times a day (TID) | ORAL | 3 refills | Status: DC

## 2018-12-15 ENCOUNTER — Inpatient Hospital Stay: Payer: Medicare Other

## 2018-12-15 ENCOUNTER — Ambulatory Visit
Admission: RE | Admit: 2018-12-15 | Discharge: 2018-12-15 | Disposition: A | Discharge status: Home / Self Care | Payer: Medicare Other | Source: Ambulatory Visit | Attending: Radiation Oncology | Admitting: Radiation Oncology

## 2018-12-15 DIAGNOSIS — C7A8 Other malignant neuroendocrine tumors: Secondary | ICD-10-CM

## 2018-12-15 DIAGNOSIS — C3411 Malignant neoplasm of upper lobe, right bronchus or lung: Secondary | ICD-10-CM | POA: Diagnosis not present

## 2018-12-15 DIAGNOSIS — Z510 Encounter for antineoplastic radiation therapy: Secondary | ICD-10-CM | POA: Diagnosis not present

## 2018-12-15 LAB — CBC
HCT: 40.6 % (ref 39.0–52.0)
Hemoglobin: 13.7 g/dL (ref 13.0–17.0)
MCH: 31.1 pg (ref 26.0–34.0)
MCHC: 33.7 g/dL (ref 30.0–36.0)
MCV: 92.3 fL (ref 80.0–100.0)
Platelets: 259 10*3/uL (ref 150–400)
RBC: 4.4 MIL/uL (ref 4.22–5.81)
RDW: 13.5 % (ref 11.5–15.5)
WBC: 7.4 10*3/uL (ref 4.0–10.5)
nRBC: 0 % (ref 0.0–0.2)

## 2018-12-16 ENCOUNTER — Ambulatory Visit
Admission: RE | Admit: 2018-12-16 | Discharge: 2018-12-16 | Disposition: A | Discharge status: Home / Self Care | Payer: Medicare Other | Source: Ambulatory Visit | Attending: Radiation Oncology | Admitting: Radiation Oncology

## 2018-12-16 DIAGNOSIS — Z510 Encounter for antineoplastic radiation therapy: Secondary | ICD-10-CM | POA: Diagnosis not present

## 2018-12-17 ENCOUNTER — Ambulatory Visit: Payer: Medicare Other

## 2018-12-20 ENCOUNTER — Ambulatory Visit: Payer: Medicare Other

## 2018-12-20 DIAGNOSIS — Z510 Encounter for antineoplastic radiation therapy: Secondary | ICD-10-CM | POA: Diagnosis not present

## 2018-12-21 ENCOUNTER — Ambulatory Visit
Admission: RE | Admit: 2018-12-21 | Discharge: 2018-12-21 | Disposition: A | Discharge status: Home / Self Care | Payer: Medicare Other | Source: Ambulatory Visit | Attending: Radiation Oncology | Admitting: Radiation Oncology

## 2018-12-21 DIAGNOSIS — Z510 Encounter for antineoplastic radiation therapy: Secondary | ICD-10-CM | POA: Diagnosis not present

## 2018-12-22 ENCOUNTER — Ambulatory Visit
Admission: RE | Admit: 2018-12-22 | Discharge: 2018-12-22 | Disposition: A | Discharge status: Home / Self Care | Payer: Medicare Other | Source: Ambulatory Visit | Attending: Radiation Oncology | Admitting: Radiation Oncology

## 2018-12-22 ENCOUNTER — Inpatient Hospital Stay: Payer: Medicare Other

## 2018-12-22 DIAGNOSIS — Z510 Encounter for antineoplastic radiation therapy: Secondary | ICD-10-CM | POA: Diagnosis not present

## 2018-12-23 ENCOUNTER — Ambulatory Visit: Payer: Medicare Other | Admitting: Oncology

## 2018-12-23 ENCOUNTER — Other Ambulatory Visit: Payer: Medicare Other

## 2018-12-23 ENCOUNTER — Ambulatory Visit
Admission: RE | Admit: 2018-12-23 | Discharge: 2018-12-23 | Disposition: A | Discharge status: Home / Self Care | Payer: Medicare Other | Source: Ambulatory Visit | Attending: Radiation Oncology | Admitting: Radiation Oncology

## 2018-12-23 DIAGNOSIS — Z510 Encounter for antineoplastic radiation therapy: Secondary | ICD-10-CM | POA: Diagnosis not present

## 2018-12-24 ENCOUNTER — Other Ambulatory Visit: Payer: Self-pay

## 2018-12-24 ENCOUNTER — Ambulatory Visit
Admission: RE | Admit: 2018-12-24 | Discharge: 2018-12-24 | Disposition: A | Discharge status: Home / Self Care | Payer: Medicare Other | Source: Ambulatory Visit | Attending: Radiation Oncology | Admitting: Radiation Oncology

## 2018-12-24 ENCOUNTER — Encounter: Payer: Self-pay | Admitting: Oncology

## 2018-12-24 ENCOUNTER — Inpatient Hospital Stay (HOSPITAL_BASED_OUTPATIENT_CLINIC_OR_DEPARTMENT_OTHER): Payer: Medicare Other | Admitting: Oncology

## 2018-12-24 ENCOUNTER — Inpatient Hospital Stay: Payer: Medicare Other

## 2018-12-24 VITALS — BP 156/98 | HR 94 | Temp 96.0°F | Resp 18 | Wt 161.4 lb

## 2018-12-24 DIAGNOSIS — Z79899 Other long term (current) drug therapy: Secondary | ICD-10-CM

## 2018-12-24 DIAGNOSIS — E119 Type 2 diabetes mellitus without complications: Secondary | ICD-10-CM

## 2018-12-24 DIAGNOSIS — Z510 Encounter for antineoplastic radiation therapy: Secondary | ICD-10-CM | POA: Diagnosis not present

## 2018-12-24 DIAGNOSIS — C3411 Malignant neoplasm of upper lobe, right bronchus or lung: Secondary | ICD-10-CM | POA: Diagnosis not present

## 2018-12-24 DIAGNOSIS — I10 Essential (primary) hypertension: Secondary | ICD-10-CM

## 2018-12-24 DIAGNOSIS — Z87891 Personal history of nicotine dependence: Secondary | ICD-10-CM

## 2018-12-24 DIAGNOSIS — J439 Emphysema, unspecified: Secondary | ICD-10-CM

## 2018-12-24 DIAGNOSIS — E785 Hyperlipidemia, unspecified: Secondary | ICD-10-CM

## 2018-12-24 DIAGNOSIS — R12 Heartburn: Secondary | ICD-10-CM

## 2018-12-24 DIAGNOSIS — Z923 Personal history of irradiation: Secondary | ICD-10-CM

## 2018-12-24 DIAGNOSIS — C7A8 Other malignant neuroendocrine tumors: Secondary | ICD-10-CM

## 2018-12-24 LAB — CBC WITH DIFFERENTIAL/PLATELET
Abs Immature Granulocytes: 0.04 10*3/uL (ref 0.00–0.07)
Basophils Absolute: 0.1 10*3/uL (ref 0.0–0.1)
Basophils Relative: 1 %
Eosinophils Absolute: 0.3 10*3/uL (ref 0.0–0.5)
Eosinophils Relative: 4 %
HCT: 40.4 % (ref 39.0–52.0)
HEMOGLOBIN: 13.6 g/dL (ref 13.0–17.0)
Immature Granulocytes: 1 %
Lymphocytes Relative: 23 %
Lymphs Abs: 1.6 10*3/uL (ref 0.7–4.0)
MCH: 31 pg (ref 26.0–34.0)
MCHC: 33.7 g/dL (ref 30.0–36.0)
MCV: 92 fL (ref 80.0–100.0)
Monocytes Absolute: 1 10*3/uL (ref 0.1–1.0)
Monocytes Relative: 14 %
Neutro Abs: 4.1 10*3/uL (ref 1.7–7.7)
Neutrophils Relative %: 57 %
Platelets: 267 10*3/uL (ref 150–400)
RBC: 4.39 MIL/uL (ref 4.22–5.81)
RDW: 13.6 % (ref 11.5–15.5)
WBC: 7.1 10*3/uL (ref 4.0–10.5)
nRBC: 0 % (ref 0.0–0.2)

## 2018-12-24 LAB — COMPREHENSIVE METABOLIC PANEL
ALT: 29 U/L (ref 0–44)
AST: 28 U/L (ref 15–41)
Albumin: 3.9 g/dL (ref 3.5–5.0)
Alkaline Phosphatase: 81 U/L (ref 38–126)
Anion gap: 6 (ref 5–15)
BUN: 11 mg/dL (ref 8–23)
CO2: 27 mmol/L (ref 22–32)
Calcium: 9.4 mg/dL (ref 8.9–10.3)
Chloride: 104 mmol/L (ref 98–111)
Creatinine, Ser: 1.15 mg/dL (ref 0.61–1.24)
GFR calc Af Amer: >60 mL/min (ref >60)
GFR calc non Af Amer: >60 mL/min (ref >60)
Glucose, Bld: 101 mg/dL — ABNORMAL HIGH (ref 70–99)
Potassium: 4.6 mmol/L (ref 3.5–5.1)
Sodium: 137 mmol/L (ref 135–145)
Total Bilirubin: 0.5 mg/dL (ref 0.3–1.2)
Total Protein: 8 g/dL (ref 6.5–8.1)

## 2018-12-24 NOTE — Progress Notes (Signed)
Patient here for follow up. No concerns voiced.  °

## 2018-12-26 NOTE — Progress Notes (Signed)
Hematology/Oncology Follow up note [facility] [phone] [phone]   Patient Care Team: Dion Body, MD as PCP - General (Family Medicine) Telford Nab, RN as Registered Nurse  REFERRING PROVIDER: Dion Body, MD  Lung [place] VISIT Follow up for lung adenocarcinoma.  HISTORY OF PRESENTING ILLNESS:  Joseph Barron is a  68 y.o.  male with PMH listed below who was referred to me for evaluation of lung mass.  Patient had CT chest lung cancer screening done on January 12, 2018 which showed there is a spiculated nodule with a volume derived mean diameter of 15.2 mm, which abuts the pleural surface, highly concerning for primary bronchogenic neoplasm.  He is former smoker, quit recently. 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  Reports mild shortness of breath with climbing stairs otherwise feeling well.  He has a chronic cough productive with clear sputum.  Denies any weight loss, headache, abdominal pain, lower extremity swelling.  Today he is accompanied by his daughter to clinic.  Patient's case was discussed on tumor conference on January 14, 2018.  Given his heavy smoking history/COPD/emphysema, risks excised for biopsy. Consensus decision was to obtain a PET scan, pulmonary lung function testing.  If his lung mass is hypermetabolic on PET scan and no other involvement, he will have surgical evaluation regarding possibilities of resection as well as obtaining diagnosis.  # 01/20/2018 PET scan  1. 15.2 mm nodule of concern on recent lung cancer screening CT shows no appreciable change in the 8 day interval since that exam. As such, low level hypermetabolism on today's PET study is more likely related to neoplastic etiology than infection/inflammation. No evidence for hypermetabolic hilar or mediastinal lymph nodes. 2. Focal  hypermetabolic FDG accumulation in the distal sigmoid colon with really no marked FDG uptake in the colon elsewhere. CT imaging shows an apparent focal soft tissue lesion at this location. Advanced adenoma or neoplasm could have this appearance. Correlation with colorectal cancer screening history recommended and colonoscopy may be warranted. 3.  Emphysema. (ICD10-J43.9) 4.  Aortic Atherosclerois (ICD10-170.0) # Biopsy was done at [place]  On 02/03/2018.  A.Right Lung, Fine Needle Aspiration II (smears and cell block):  Non-small cell carcinoma with histologic changes and immunohistochemical profile consistent with lung adenocarcinoma, moderately differentiated.   Specimen Adequacy:Satisfactory for evaluation.        Note: Floaters identified on cell block.  B.Cytology core biopsy:  Non-small cell carcinoma with histologic changes and  immunohistochemical profile consistent with lung adenocarcinoma,  moderately differentiated.   # 02/23/2018 patient is s/p right thoracotomy and wedge resection. Post surgical course complicated with prolong air leak/pnemothorax.   # Surveillance CT chest abdomen pelvis done on 09/28/2018.  Images were independently reviewed by me and discussed with patient CT is concerning for local recurrence with progressive multinodular thickening in the right upper lobe along the minor fissure and to a lesser extent along the upper margin of major fissure.  # 10/05/2018 PET showed findings consistent with recurrent tumor along the surgical resection bed in the right upper lobe as detailed above. There's also nodularity and interstitial thickening more posteriorly and inferiorly which is likely interstitial or endobronchial spread of tumor. MRI brain 10/14/2018 negative for metastatic disease.  # Patient declined chemotherapy.He received IMRT to recurrent disease and will finish on 01/13/2019  INTERVAL HISTORY Joseph Barron is a 68 y.o.  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 Patient is currently receiving IMRT to recurrence disease.last treatment n 01/13/2019.  Reports feeling well at baseline  Chronic fatigue at baseline. SOB with exertion. No new complaints    Review of Systems  Constitutional: Positive for fatigue. Negative for appetite change, chills, fever and unexpected weight change.  HENT:   Negative for hearing loss and voice change.   Eyes: Negative for eye problems and icterus.  Respiratory: Positive for shortness of breath. Negative for chest tightness and cough.   Cardiovascular: Negative for chest pain and leg swelling.  Gastrointestinal: Negative for abdominal distention and abdominal pain.       Heart burn  Endocrine: Negative for hot flashes.  Genitourinary: Negative for difficulty urinating, dysuria and frequency.   Musculoskeletal: Negative for arthralgias.  Skin: Negative for itching and rash.  Neurological: Negative for light-headedness and numbness.  Hematological: Negative for adenopathy. Does not bruise/bleed easily.  Psychiatric/Behavioral: Negative for confusion.     MEDICAL HISTORY:  Past Medical History:  Diagnosis Date  . 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 GERD (gastroesophageal reflux disease)   . Hyperlipemia   . Hypertension   . Non-small cell lung cancer ([place]) 12/2017   surgical resection 02/23/2018.  . Pulmonary nodules 01/13/2018  . Vaccine counseling 12/22/2017    SURGICAL HISTORY: Past Surgical History:  Procedure Laterality Date  . COLONOSCOPY WITH PROPOFOL N/A 02/22/2018   Procedure: COLONOSCOPY WITH PROPOFOL;  Surgeon: Manya Silvas, MD;  [facility] ENDOSCOPY;  Service: Endoscopy;  Laterality: N/A;  . EYE SURGERY Left child  . THORACOTOMY/LOBECTOMY Right 02/23/2018   Procedure: THORACOTOMY/ WEDGE RESECTION;  Surgeon: Nestor Lewandowsky, MD;  Location: [facility] ORS;  Service: Thoracic;   Laterality: Right;  Marland Kitchen VIDEO BRONCHOSCOPY N/A 02/23/2018   Procedure: PREOP BRONCHOSCOPY;  Surgeon: Nestor Lewandowsky, MD;  Location: [facility] ORS;  Service: Thoracic;  Laterality: N/A;    SOCIAL HISTORY: Social History   Socioeconomic History  . Marital status: Married    Spouse name: Not on file  . Number of children: Not on file  . Years of education: Not on file  . Highest education level: Not on file  Occupational History  . Not on file  Social Needs  . Financial resource strain: Not on file  . Food insecurity:    Worry: Not on file    Inability: Not on file  . Transportation needs:    Medical: Not on file    Non-medical: Not on file  Tobacco Use  . Smoking status: Former Smoker    Packs/day: 1.00    Years: 50.00    Pack years: 50.00    Types: Cigarettes    Last attempt to quit: 11/17/2017    Years since quitting: 1.1  . Smokeless tobacco: Former Systems developer    Types: [place] date: 01/15/1989  Substance and Sexual Activity  . Alcohol use: Yes    Alcohol/week: 9.0 standard drinks    Types: 7 Cans of beer, 2 Shots of liquor per week  . Drug use: Never  . Sexual activity: Yes  Lifestyle  . Physical activity:    Days per week: Not on file    Minutes per session: Not on file  . Stress: Not on file  Relationships  . Social connections:    Talks on phone: Not on file    Gets together: Not on file    Attends religious service:  Not on file    Active member of club or organization: Not on file    Attends meetings of clubs or organizations: Not on file    Relationship status: Not on file  . Intimate partner violence:    Fear of current or ex partner: Not on file    Emotionally abused: Not on file    Physically abused: Not on file    Forced sexual activity: Not on file  Other Topics Concern  . Not on file  Social History Narrative  . Not on file    FAMILY HISTORY: Family History  Problem Relation Age of Onset  . Arthritis Mother   . COPD Father   . Coronary artery  disease Father   . Transient ischemic attack Father   . Alcoholism Father   . Bowel Disease Father   . COPD Sister   . Prostatitis Brother   . Thyroid disease Sister   . Thyroid disease Sister   . Liver cancer Brother   . Diabetes Brother     ALLERGIES:  has No Known Allergies.  MEDICATIONS:  Current Outpatient Medications  Medication Sig Dispense Refill  . atorvastatin (LIPITOR) 10 MG tablet Take 10 mg by mouth at bedtime.     Marland Kitchen lisinopril (PRINIVIL,ZESTRIL) 20 MG tablet Take 20 mg by mouth daily.    Marland Kitchen omeprazole (PRILOSEC) 40 MG capsule Take by mouth daily.  11  . sucralfate (CARAFATE) 1 g tablet Take 1 tablet (1 g total) by mouth 3 (three) times daily. Dissolve in 3-4 tbsp warm water, swish and swallow. 90 tablet 3   No current facility-administered medications for this visit.      PHYSICAL EXAMINATION: ECOG PERFORMANCE STATUS: 1 - Symptomatic but completely ambulatory Vitals:   12/24/18 1017  BP: (!) 156/98  Pulse: 94  Resp: 18  Temp: (!) 96 F (35.6 C)   Filed Weights   12/24/18 1017  Weight: 161 lb 6.4 oz (73.2 kg)    Physical Exam  Constitutional: He is oriented to person, place, and time. No distress.  HENT:  Head: Normocephalic and atraumatic.  Nose: Nose normal.  Mouth/Throat: Oropharynx is clear and moist. No oropharyngeal exudate.  Eyes: Pupils are equal, round, and reactive to light. EOM are normal. Right eye exhibits no discharge. Left eye exhibits no discharge. No scleral icterus.  Neck: Normal range of motion. Neck supple. No JVD present.  Cardiovascular: Normal rate, regular rhythm and normal heart sounds.  No murmur heard. Pulmonary/Chest: Effort normal. No respiratory distress. He has no wheezes. He has no rales. He exhibits no tenderness.  Decreased breath sound bilaterally.   Abdominal: Soft. Bowel sounds are normal. He exhibits no distension. There is no abdominal tenderness.  Musculoskeletal: Normal range of motion.        General: No  tenderness or edema.  Lymphadenopathy:    He has no cervical adenopathy.  Neurological: He is alert and oriented to person, place, and time. No cranial nerve deficit. He exhibits normal muscle tone. Coordination normal.  Skin: Skin is warm and dry. No rash noted. He is not diaphoretic. No erythema.  Psychiatric: Affect and judgment normal.     LABORATORY DATA:  I have reviewed the data as listed Lab Results  Component Value Date   WBC 7.1 12/24/2018   HGB 13.6 12/24/2018   HCT 40.4 12/24/2018   MCV 92.0 12/24/2018   PLT 267 12/24/2018   Recent Labs    07/01/18 1348 09/30/18 1437 12/24/18 0923  NA 138  138 137  K 4.4 4.6 4.6  CL 107 103 104  CO2 23 26 27   GLUCOSE 126* 94 101*  BUN 17 14 11   CREATININE 1.29* 1.10 1.15  CALCIUM 9.3 9.9 9.4  GFRNONAA 55* >60 >60  GFRAA >60 >60 >60  PROT 7.7 8.1 8.0  ALBUMIN 3.6 3.9 3.9  AST 27 27 28   ALT 27 28 29   ALKPHOS 90 98 81  BILITOT 0.3 0.5 0.5   Surgical Pathology  CASE: [id number]  PATIENT: Joseph Barron  Surgical Pathology Report  DIAGNOSIS:  A. LUNG, RIGHT UPPER LOBE; WEDGE RESECTION:  - LARGE CELL NEUROENDOCRINE CARCINOMA.  - SEE CANCER SUMMARY BELOW.  - CHANGE CONSISTENT WITH PRIOR FNA.  - REACTIVE SUBPLEURAL SMOOTH MUSCLE HYPERPLASIA.   B. PLEURA, RIGHT; BIOPSY:  - HYALINE PLEURAL PLAQUE WITH FOCAL REACTIVE MESOTHELIAL HYPERPLASIA.  - NEGATIVE FOR MALIGNANCY.     ASSESSMENT & PLAN:  1. Large cell neuroendocrine carcinoma ([place])   2. Pulmonary emphysema, unspecified emphysema type ([place])   3. Heart burn    #Large cell neuroendocrine carcinoma with recurrence disease.  Currently on IMRT treatment.  Declined chemotherapy/immunotherapy.  Will repeat CT chest around 03/11/2019 and CT Q3 month after that for surveillance.  Heartburn, likely GERD, esophagitis due to RT. Continue omeprazole and Sucralfate  Orders Placed This Encounter  Procedures  . CT Chest W Contrast    Standing Status:   Future     Standing Expiration Date:   12/24/2019    Order Specific Question:   ** REASON FOR EXAM (FREE TEXT)    Answer:   lung cancer follow up s/p radiation    Order Specific Question:   If indicated for the ordered procedure, I authorize the administration of contrast media per Radiology protocol    Answer:   Yes    Order Specific Question:   Preferred imaging location?    Answer:   [facility]    Order Specific Question:   Radiology Contrast Protocol - do NOT remove file path    Answer:   [email or web address]  . CBC with Differential/Platelet    Standing Status:   Future    Standing Expiration Date:   12/24/2019  . Comprehensive metabolic panel    Standing Status:   Future    Standing Expiration Date:   12/24/2019    Return of visit: after CT scan to discuss results.  We spent sufficient time to discuss many aspect of care, questions were answered to patient's satisfaction. Total face to face encounter time for this patient visit was 15 min. >50% of the time was  spent in counseling and coordination of care.     Earlie Server, MD, PhD Hematology Oncology [facility] at [facility] Pager- [phone] 12/24/2018

## 2018-12-27 ENCOUNTER — Ambulatory Visit
Admission: RE | Admit: 2018-12-27 | Discharge: 2018-12-27 | Disposition: A | Discharge status: Home / Self Care | Payer: Medicare Other | Source: Ambulatory Visit | Attending: Radiation Oncology | Admitting: Radiation Oncology

## 2018-12-27 DIAGNOSIS — Z510 Encounter for antineoplastic radiation therapy: Secondary | ICD-10-CM | POA: Diagnosis not present

## 2018-12-27 DIAGNOSIS — C7A090 Malignant carcinoid tumor of the bronchus and lung: Secondary | ICD-10-CM | POA: Diagnosis not present

## 2018-12-28 ENCOUNTER — Ambulatory Visit
Admission: RE | Admit: 2018-12-28 | Discharge: 2018-12-28 | Disposition: A | Discharge status: Home / Self Care | Payer: Medicare Other | Source: Ambulatory Visit | Attending: Radiation Oncology | Admitting: Radiation Oncology

## 2018-12-28 DIAGNOSIS — Z510 Encounter for antineoplastic radiation therapy: Secondary | ICD-10-CM | POA: Diagnosis not present

## 2018-12-29 ENCOUNTER — Inpatient Hospital Stay: Payer: Medicare Other | Attending: Radiation Oncology

## 2018-12-29 ENCOUNTER — Ambulatory Visit
Admission: RE | Admit: 2018-12-29 | Discharge: 2018-12-29 | Disposition: A | Discharge status: Home / Self Care | Payer: Medicare Other | Source: Ambulatory Visit | Attending: Radiation Oncology | Admitting: Radiation Oncology

## 2018-12-29 DIAGNOSIS — C7A8 Other malignant neuroendocrine tumors: Secondary | ICD-10-CM

## 2018-12-29 DIAGNOSIS — C3411 Malignant neoplasm of upper lobe, right bronchus or lung: Secondary | ICD-10-CM | POA: Insufficient documentation

## 2018-12-29 DIAGNOSIS — Z510 Encounter for antineoplastic radiation therapy: Secondary | ICD-10-CM | POA: Diagnosis not present

## 2018-12-29 LAB — CBC
HCT: 40.7 % (ref 39.0–52.0)
Hemoglobin: 13.6 g/dL (ref 13.0–17.0)
MCH: 31.3 pg (ref 26.0–34.0)
MCHC: 33.4 g/dL (ref 30.0–36.0)
MCV: 93.6 fL (ref 80.0–100.0)
Platelets: 285 10*3/uL (ref 150–400)
RBC: 4.35 MIL/uL (ref 4.22–5.81)
RDW: 13.8 % (ref 11.5–15.5)
WBC: 7 10*3/uL (ref 4.0–10.5)
nRBC: 0 % (ref 0.0–0.2)

## 2018-12-30 ENCOUNTER — Ambulatory Visit
Admission: RE | Admit: 2018-12-30 | Discharge: 2018-12-30 | Disposition: A | Discharge status: Home / Self Care | Payer: Medicare Other | Source: Ambulatory Visit | Attending: Radiation Oncology | Admitting: Radiation Oncology

## 2018-12-30 DIAGNOSIS — Z510 Encounter for antineoplastic radiation therapy: Secondary | ICD-10-CM | POA: Diagnosis not present

## 2018-12-31 ENCOUNTER — Ambulatory Visit
Admission: RE | Admit: 2018-12-31 | Discharge: 2018-12-31 | Disposition: A | Discharge status: Home / Self Care | Payer: Medicare Other | Source: Ambulatory Visit | Attending: Radiation Oncology | Admitting: Radiation Oncology

## 2018-12-31 DIAGNOSIS — Z510 Encounter for antineoplastic radiation therapy: Secondary | ICD-10-CM | POA: Diagnosis not present

## 2019-01-03 ENCOUNTER — Ambulatory Visit
Admission: RE | Admit: 2019-01-03 | Discharge: 2019-01-03 | Disposition: A | Discharge status: Home / Self Care | Payer: Medicare Other | Source: Ambulatory Visit | Attending: Radiation Oncology | Admitting: Radiation Oncology

## 2019-01-03 DIAGNOSIS — Z510 Encounter for antineoplastic radiation therapy: Secondary | ICD-10-CM | POA: Diagnosis not present

## 2019-01-04 ENCOUNTER — Ambulatory Visit
Admission: RE | Admit: 2019-01-04 | Discharge: 2019-01-04 | Disposition: A | Discharge status: Home / Self Care | Payer: Medicare Other | Source: Ambulatory Visit | Attending: Radiation Oncology | Admitting: Radiation Oncology

## 2019-01-04 DIAGNOSIS — Z510 Encounter for antineoplastic radiation therapy: Secondary | ICD-10-CM | POA: Diagnosis not present

## 2019-01-05 ENCOUNTER — Other Ambulatory Visit: Payer: Self-pay

## 2019-01-05 ENCOUNTER — Ambulatory Visit
Admission: RE | Admit: 2019-01-05 | Discharge: 2019-01-05 | Disposition: A | Discharge status: Home / Self Care | Payer: Medicare Other | Source: Ambulatory Visit | Attending: Radiation Oncology | Admitting: Radiation Oncology

## 2019-01-05 ENCOUNTER — Inpatient Hospital Stay: Payer: Medicare Other

## 2019-01-05 DIAGNOSIS — Z510 Encounter for antineoplastic radiation therapy: Secondary | ICD-10-CM | POA: Diagnosis not present

## 2019-01-05 DIAGNOSIS — C7A8 Other malignant neuroendocrine tumors: Secondary | ICD-10-CM

## 2019-01-05 DIAGNOSIS — C3411 Malignant neoplasm of upper lobe, right bronchus or lung: Secondary | ICD-10-CM | POA: Diagnosis not present

## 2019-01-05 LAB — CBC
HEMATOCRIT: 39.7 % (ref 39.0–52.0)
Hemoglobin: 13.4 g/dL (ref 13.0–17.0)
MCH: 31.6 pg (ref 26.0–34.0)
MCHC: 33.8 g/dL (ref 30.0–36.0)
MCV: 93.6 fL (ref 80.0–100.0)
Platelets: 289 10*3/uL (ref 150–400)
RBC: 4.24 MIL/uL (ref 4.22–5.81)
RDW: 13.9 % (ref 11.5–15.5)
WBC: 6.2 10*3/uL (ref 4.0–10.5)
nRBC: 0 % (ref 0.0–0.2)

## 2019-01-06 ENCOUNTER — Ambulatory Visit
Admission: RE | Admit: 2019-01-06 | Discharge: 2019-01-06 | Disposition: A | Discharge status: Home / Self Care | Payer: Medicare Other | Source: Ambulatory Visit | Attending: Radiation Oncology | Admitting: Radiation Oncology

## 2019-01-06 ENCOUNTER — Other Ambulatory Visit: Payer: Self-pay

## 2019-01-06 DIAGNOSIS — Z510 Encounter for antineoplastic radiation therapy: Secondary | ICD-10-CM | POA: Diagnosis not present

## 2019-01-07 ENCOUNTER — Other Ambulatory Visit: Payer: Self-pay

## 2019-01-07 ENCOUNTER — Ambulatory Visit
Admission: RE | Admit: 2019-01-07 | Discharge: 2019-01-07 | Disposition: A | Discharge status: Home / Self Care | Payer: Medicare Other | Source: Ambulatory Visit | Attending: Radiation Oncology | Admitting: Radiation Oncology

## 2019-01-07 DIAGNOSIS — Z510 Encounter for antineoplastic radiation therapy: Secondary | ICD-10-CM | POA: Diagnosis not present

## 2019-01-10 ENCOUNTER — Other Ambulatory Visit: Payer: Self-pay

## 2019-01-10 ENCOUNTER — Ambulatory Visit
Admission: RE | Admit: 2019-01-10 | Discharge: 2019-01-10 | Disposition: A | Discharge status: Home / Self Care | Payer: Medicare Other | Source: Ambulatory Visit | Attending: Radiation Oncology | Admitting: Radiation Oncology

## 2019-01-10 DIAGNOSIS — Z510 Encounter for antineoplastic radiation therapy: Secondary | ICD-10-CM | POA: Diagnosis not present

## 2019-01-11 ENCOUNTER — Ambulatory Visit
Admission: RE | Admit: 2019-01-11 | Discharge: 2019-01-11 | Disposition: A | Discharge status: Home / Self Care | Payer: Medicare Other | Source: Ambulatory Visit | Attending: Radiation Oncology | Admitting: Radiation Oncology

## 2019-01-11 DIAGNOSIS — Z510 Encounter for antineoplastic radiation therapy: Secondary | ICD-10-CM | POA: Diagnosis not present

## 2019-01-12 ENCOUNTER — Ambulatory Visit: Payer: Medicare Other

## 2019-01-12 ENCOUNTER — Other Ambulatory Visit: Payer: Self-pay

## 2019-01-12 ENCOUNTER — Ambulatory Visit
Admission: RE | Admit: 2019-01-12 | Discharge: 2019-01-12 | Disposition: A | Discharge status: Home / Self Care | Payer: Medicare Other | Source: Ambulatory Visit | Attending: Radiation Oncology | Admitting: Radiation Oncology

## 2019-01-12 DIAGNOSIS — Z510 Encounter for antineoplastic radiation therapy: Secondary | ICD-10-CM | POA: Diagnosis not present

## 2019-01-13 ENCOUNTER — Ambulatory Visit
Admission: RE | Admit: 2019-01-13 | Discharge: 2019-01-13 | Disposition: A | Discharge status: Home / Self Care | Payer: Medicare Other | Source: Ambulatory Visit | Attending: Radiation Oncology | Admitting: Radiation Oncology

## 2019-01-13 ENCOUNTER — Other Ambulatory Visit: Payer: Self-pay

## 2019-01-13 DIAGNOSIS — Z510 Encounter for antineoplastic radiation therapy: Secondary | ICD-10-CM | POA: Diagnosis not present

## 2019-01-28 ENCOUNTER — Encounter: Payer: Self-pay | Admitting: Oncology

## 2019-01-28 ENCOUNTER — Ambulatory Visit
Admission: RE | Admit: 2019-01-28 | Discharge: 2019-01-28 | Disposition: A | Discharge status: Home / Self Care | Payer: Medicare Other | Attending: Oncology | Admitting: Oncology

## 2019-01-28 ENCOUNTER — Other Ambulatory Visit: Payer: Self-pay

## 2019-01-28 ENCOUNTER — Ambulatory Visit
Admission: RE | Admit: 2019-01-28 | Discharge: 2019-01-28 | Disposition: A | Discharge status: Home / Self Care | Payer: Medicare Other | Source: Ambulatory Visit | Attending: Oncology | Admitting: Oncology

## 2019-01-28 ENCOUNTER — Telehealth: Payer: Self-pay | Admitting: *Deleted

## 2019-01-28 ENCOUNTER — Inpatient Hospital Stay: Payer: Medicare Other | Attending: Oncology | Admitting: Oncology

## 2019-01-28 VITALS — BP 102/72 | HR 109 | Temp 99.0°F | Resp 18 | Wt 154.9 lb

## 2019-01-28 DIAGNOSIS — Z801 Family history of malignant neoplasm of trachea, bronchus and lung: Secondary | ICD-10-CM | POA: Diagnosis not present

## 2019-01-28 DIAGNOSIS — R05 Cough: Secondary | ICD-10-CM | POA: Diagnosis present

## 2019-01-28 DIAGNOSIS — C7A8 Other malignant neuroendocrine tumors: Secondary | ICD-10-CM

## 2019-01-28 DIAGNOSIS — I700 Atherosclerosis of aorta: Secondary | ICD-10-CM | POA: Diagnosis not present

## 2019-01-28 DIAGNOSIS — R059 Cough, unspecified: Secondary | ICD-10-CM

## 2019-01-28 DIAGNOSIS — E785 Hyperlipidemia, unspecified: Secondary | ICD-10-CM | POA: Diagnosis not present

## 2019-01-28 DIAGNOSIS — Z79899 Other long term (current) drug therapy: Secondary | ICD-10-CM

## 2019-01-28 DIAGNOSIS — Z77090 Contact with and (suspected) exposure to asbestos: Secondary | ICD-10-CM | POA: Insufficient documentation

## 2019-01-28 DIAGNOSIS — K219 Gastro-esophageal reflux disease without esophagitis: Secondary | ICD-10-CM | POA: Diagnosis not present

## 2019-01-28 DIAGNOSIS — I10 Essential (primary) hypertension: Secondary | ICD-10-CM | POA: Diagnosis not present

## 2019-01-28 DIAGNOSIS — J439 Emphysema, unspecified: Secondary | ICD-10-CM | POA: Diagnosis not present

## 2019-01-28 DIAGNOSIS — Z87891 Personal history of nicotine dependence: Secondary | ICD-10-CM | POA: Insufficient documentation

## 2019-01-28 MED ORDER — DM-GUAIFENESIN ER 30-600 MG PO TB12: 1.0000 | ORAL_TABLET | Freq: Two times a day (BID) | ORAL | 0 refills | Status: DC

## 2019-01-28 NOTE — Progress Notes (Signed)
Patient here with complaints of worsening cough. He states he finished radiation therapy on 3/19 and cough has gotten worse since then, some shortness of breath. Pt coughs up clear mucous. It is worse at night. Pulse ox is 91% on room air, does not wear oxygen at home.

## 2019-01-28 NOTE — Telephone Encounter (Signed)
Daughter called reporting that patient cough is worse since completing radiation therapy. She is concerned and asking what needs to be done for him, is it just part of treatment side effects or does he need to be evaluated? Please Arnoldo Hooker

## 2019-01-28 NOTE — Telephone Encounter (Signed)
Pr Dr Tasia Catchings patient to come for CXR then see her afterwards. Appts accepted by daughter will get CXR at 1 and see physician at 130

## 2019-01-28 NOTE — Progress Notes (Addendum)
Hematology/Oncology Follow up note [facility] [phone] [phone]   Patient Care Team: Dion Body, MD as PCP - General (Family Medicine) Telford Nab, RN as Registered Nurse  REFERRING PROVIDER: Dion Body, MD  Lung [place] VISIT Follow up for lung adenocarcinoma.  HISTORY OF PRESENTING ILLNESS:  Joseph Barron is a  68 y.o.  male with PMH listed below who was referred to me for evaluation of lung mass.  Patient had CT chest lung cancer screening done on January 12, 2018 which showed there is a spiculated nodule with a volume derived mean diameter of 15.2 mm, which abuts the pleural surface, highly concerning for primary bronchogenic neoplasm.  He is former smoker, quit recently. 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  Reports mild shortness of breath with climbing stairs otherwise feeling well.  He has a chronic cough productive with clear sputum.  Denies any weight loss, headache, abdominal pain, lower extremity swelling.  Today he is accompanied by his daughter to clinic.  Patient's case was discussed on tumor conference on January 14, 2018.  Given his heavy smoking history/COPD/emphysema, risks excised for biopsy. Consensus decision was to obtain a PET scan, pulmonary lung function testing.  If his lung mass is hypermetabolic on PET scan and no other involvement, he will have surgical evaluation regarding possibilities of resection as well as obtaining diagnosis.  # 01/20/2018 PET scan showed low level hypermetabolism of right upper lobe lung nodule.  # 02/23/2018 patient is s/p right thoracotomy and wedge resection. Post surgical course complicated with prolong air leak/pnemothorax.  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   #  09/28/2018. CT chest abdomen pelvis  showed local recurrence with progressive multinodular thickening in the right upper lobe along the minor fissure and to a lesser extent along the upper margin of major fissure.  # 10/05/2018 PET showed findings consistent with recurrent tumor along the surgical resection bed in the right upper lobe as detailed above. There's also nodularity and interstitial thickening more posteriorly and inferiorly which is likely interstitial or endobronchial spread of tumor. MRI brain 10/14/2018 negative for metastatic disease.  # Patient declined chemotherapy.He received IMRT to recurrent disease and will finish on 01/13/2019  INTERVAL HISTORY Joseph Barron is a 68 y.o. male who has above history reviewed by me today present for acute visit for evaluation of worsening cough.  He finished radiation on 01/13/2019.  Daughter called today and reports that patient has worsened cough and wants to be advised.  Patient was added on for evaluation.  Patient reports cough has become worsen since the finish of radiation. He has chronic cough which is worse now.  Coughs up whitish sputum.  Chronic SOB, worse with exertion.  He went to play 18 hole golf recently and felt sob after that.  Appetite is good. Denies fever, chills, chest pain or abdominal pain.  Heart burn is better   Review of Systems  Constitutional: Positive for fatigue. Negative for appetite change, chills, fever and unexpected weight change.  HENT:   Negative for hearing loss and voice change.   Eyes: Negative for eye problems and icterus.  Respiratory: Positive for cough and shortness of breath. Negative for chest tightness.   Cardiovascular: Negative for chest pain and leg swelling.  Gastrointestinal: Negative for abdominal distention and abdominal pain.  Endocrine: Negative for hot  flashes.  Genitourinary: Negative for difficulty urinating, dysuria and frequency.   Musculoskeletal: Negative for arthralgias.  Skin: Negative for itching and rash.   Neurological: Negative for light-headedness and numbness.  Hematological: Negative for adenopathy. Does not bruise/bleed easily.  Psychiatric/Behavioral: Negative for confusion.     MEDICAL HISTORY:  Past Medical History:  Diagnosis Date  . 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 GERD (gastroesophageal reflux disease)   . Hyperlipemia   . Hypertension   . Non-small cell lung cancer ([place]) 12/2017   surgical resection 02/23/2018.  . Pulmonary nodules 01/13/2018  . Vaccine counseling 12/22/2017    SURGICAL HISTORY: Past Surgical History:  Procedure Laterality Date  . COLONOSCOPY WITH PROPOFOL N/A 02/22/2018   Procedure: COLONOSCOPY WITH PROPOFOL;  Surgeon: Manya Silvas, MD;  Location: [facility] ENDOSCOPY;  Service: Endoscopy;  Laterality: N/A;  . EYE SURGERY Left child  . THORACOTOMY/LOBECTOMY Right 02/23/2018   Procedure: THORACOTOMY/ WEDGE RESECTION;  Surgeon: Nestor Lewandowsky, MD;  Location: [facility] ORS;  Service: Thoracic;  Laterality: Right;  Marland Kitchen VIDEO BRONCHOSCOPY N/A 02/23/2018   Procedure: PREOP BRONCHOSCOPY;  Surgeon: Nestor Lewandowsky, MD;  Location: [facility] ORS;  Service: Thoracic;  Laterality: N/A;    SOCIAL HISTORY: Social History   Socioeconomic History  . Marital status: Married    Spouse name: Not on file  . Number of children: Not on file  . Years of education: Not on file  . Highest education level: Not on file  Occupational History  . Not on file  Social Needs  . Financial resource strain: Not on file  . Food insecurity:    Worry: Not on file    Inability: Not on file  . Transportation needs:    Medical: Not on file    Non-medical: Not on file  Tobacco Use  . Smoking status: Former Smoker    Packs/day: 1.00    Years: 50.00    Pack years: 50.00    Types: Cigarettes    Last attempt to quit: 11/17/2017    Years since quitting: 1.1  . Smokeless tobacco: Former Systems developer    Types: [place] date: 01/15/1989  Substance and Sexual Activity  . Alcohol use: Yes     Alcohol/week: 9.0 standard drinks    Types: 7 Cans of beer, 2 Shots of liquor per week  . Drug use: Never  . Sexual activity: Yes  Lifestyle  . Physical activity:    Days per week: Not on file    Minutes per session: Not on file  . Stress: Not on file  Relationships  . Social connections:    Talks on phone: Not on file    Gets together: Not on file    Attends religious service: Not on file    Active member of club or organization: Not on file    Attends meetings of clubs or organizations: Not on file    Relationship status: Not on file  . Intimate partner violence:    Fear of current or ex partner: Not on file    Emotionally abused: Not on file    Physically abused: Not on file    Forced sexual activity: Not on file  Other Topics Concern  . Not on file  Social History Narrative  . Not on file    FAMILY HISTORY: Family History  Problem Relation Age of Onset  . Arthritis Mother   . COPD Father   . Coronary artery disease Father   . Transient ischemic attack Father   .  Alcoholism Father   . Bowel Disease Father   . COPD Sister   . Prostatitis Brother   . Thyroid disease Sister   . Thyroid disease Sister   . Liver cancer Brother   . Diabetes Brother     ALLERGIES:  has No Known Allergies.  MEDICATIONS:  Current Outpatient Medications  Medication Sig Dispense Refill  . atorvastatin (LIPITOR) 10 MG tablet Take 10 mg by mouth at bedtime.     Marland Kitchen lisinopril (PRINIVIL,ZESTRIL) 20 MG tablet Take 20 mg by mouth daily.    Marland Kitchen omeprazole (PRILOSEC) 40 MG capsule Take by mouth daily.  11  . sucralfate (CARAFATE) 1 g tablet Take 1 tablet (1 g total) by mouth 3 (three) times daily. Dissolve in 3-4 tbsp warm water, swish and swallow. 90 tablet 3  . dextromethorphan-guaiFENesin (MUCINEX DM) 30-600 MG 12hr tablet Take 1 tablet by mouth 2 (two) times daily. 60 tablet 0   No current facility-administered medications for this visit.      PHYSICAL EXAMINATION: ECOG PERFORMANCE  STATUS: 1 - Symptomatic but completely ambulatory Vitals:   01/28/19 1329  BP: 102/72  Pulse: (!) 109  Resp: 18  Temp: 99 F (37.2 C)  SpO2: 91%   Filed Weights   01/28/19 1329  Weight: 154 lb 14.4 oz (70.3 kg)    Physical Exam  Constitutional: He is oriented to person, place, and time. No distress.  HENT:  Head: Normocephalic and atraumatic.  Nose: Nose normal.  Mouth/Throat: Oropharynx is clear and moist. No oropharyngeal exudate.  Eyes: Pupils are equal, round, and reactive to light. EOM are normal. Right eye exhibits no discharge. Left eye exhibits no discharge. No scleral icterus.  Neck: Normal range of motion. Neck supple. No JVD present.  Cardiovascular: Normal rate, regular rhythm and normal heart sounds.  No murmur heard. Pulmonary/Chest: Effort normal. No respiratory distress. He has no wheezes. He has no rales. He exhibits no tenderness.  Decreased breath sound bilaterally.   Abdominal: Soft. Bowel sounds are normal. He exhibits no distension. There is no abdominal tenderness.  Musculoskeletal: Normal range of motion.        General: No tenderness or edema.  Lymphadenopathy:    He has no cervical adenopathy.  Neurological: He is alert and oriented to person, place, and time. No cranial nerve deficit. He exhibits normal muscle tone. Coordination normal.  Skin: Skin is warm and dry. No rash noted. He is not diaphoretic. No erythema.  Psychiatric: Affect and judgment normal.     LABORATORY DATA:  I have reviewed the data as listed Lab Results  Component Value Date   WBC 6.2 01/05/2019   HGB 13.4 01/05/2019   HCT 39.7 01/05/2019   MCV 93.6 01/05/2019   PLT 289 01/05/2019   Recent Labs    07/01/18 1348 09/30/18 1437 12/24/18 0923  NA 138 138 137  K 4.4 4.6 4.6  CL 107 103 104  CO2 23 26 27   GLUCOSE 126* 94 101*  BUN 17 14 11   CREATININE 1.29* 1.10 1.15  CALCIUM 9.3 9.9 9.4  GFRNONAA 55* >60 >60  GFRAA >60 >60 >60  PROT 7.7 8.1 8.0  ALBUMIN 3.6  3.9 3.9  AST 27 27 28   ALT 27 28 29   ALKPHOS 90 98 81  BILITOT 0.3 0.5 0.5   Surgical Pathology  CASE: [id number]  PATIENT: [facility]  Surgical Pathology Report  DIAGNOSIS:  A. LUNG, RIGHT UPPER LOBE; WEDGE RESECTION:  - LARGE CELL NEUROENDOCRINE CARCINOMA.  - SEE  CANCER SUMMARY BELOW.  - CHANGE CONSISTENT WITH PRIOR FNA.  - REACTIVE SUBPLEURAL SMOOTH MUSCLE HYPERPLASIA.   B. PLEURA, RIGHT; BIOPSY:  - HYALINE PLEURAL PLAQUE WITH FOCAL REACTIVE MESOTHELIAL HYPERPLASIA.  - NEGATIVE FOR MALIGNANCY.     ASSESSMENT & PLAN:  1. Large cell neuroendocrine carcinoma ([place])   2. Cough    #Large cell neuroendocrine carcinoma with recurrence disease.  S/p IMRT treatment.  Declined chemotherapy/immunotherapy.  Cough: CXR was obtained and independently reviewed by me.  Chronic changes on the right with decrease of bulky soft tissue along the suture line.  No new infiltrates.  Discussed with patient and daughter Winfield Rast the phone], cough is likely worsened by radiation.  Mucinext DM Rx sent to pharmacy.    Plan  CT chest around 03/11/2019 and CT Q3 month after that for surveillance.   Return of visit: after CT scan.   We spent sufficient time to discuss many aspect of care, questions were answered to patient's satisfaction. Total face to face encounter time for this patient visit was 15 min. >50% of the time was  spent in counseling and coordination of care.     Earlie Server, MD, PhD Hematology Oncology [facility] at [facility] Pager- [phone] 12/24/2018

## 2019-02-10 ENCOUNTER — Encounter: Payer: Self-pay | Admitting: Radiation Oncology

## 2019-02-10 ENCOUNTER — Ambulatory Visit
Admission: RE | Admit: 2019-02-10 | Discharge: 2019-02-10 | Disposition: A | Discharge status: Home / Self Care | Payer: Medicare Other | Source: Ambulatory Visit | Attending: Radiation Oncology | Admitting: Radiation Oncology

## 2019-02-10 ENCOUNTER — Other Ambulatory Visit: Payer: Self-pay

## 2019-02-10 VITALS — BP 141/87 | HR 116 | Temp 97.3°F | Resp 18 | Wt 150.7 lb

## 2019-02-10 DIAGNOSIS — C3411 Malignant neoplasm of upper lobe, right bronchus or lung: Secondary | ICD-10-CM | POA: Insufficient documentation

## 2019-02-10 DIAGNOSIS — C3491 Malignant neoplasm of unspecified part of right bronchus or lung: Secondary | ICD-10-CM

## 2019-02-10 DIAGNOSIS — Z923 Personal history of irradiation: Secondary | ICD-10-CM | POA: Diagnosis not present

## 2019-02-10 DIAGNOSIS — C7A8 Other malignant neuroendocrine tumors: Secondary | ICD-10-CM

## 2019-02-10 NOTE — Progress Notes (Signed)
Radiation Oncology Follow up Note  Name: Joseph Barron   Date:   02/10/2019 MRN:  [id number] DOB: 02-17-1951    This 68 y.o. 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  REFERRING PROVIDER: Dion Body, MD  HPI: Patient is a 68 year old male now at 1 month having completed radiation therapy for a recurrent large cell neuroendocrine tumor of the right lung status post resection back in April 2019.Marland Kitchen  He is seen today in routine follow-up doing fairly well.  He did have a exacerbation of cough which we expected.  Chest x-ray is obtained and shows decrease in the bulky soft tissue along the suture line with no new infiltrates.  He was started on Mucinex which is helped considerably.  He specifically denies hemoptysis or chest pain.  COMPLICATIONS OF TREATMENT: none  FOLLOW UP COMPLIANCE: keeps appointments   PHYSICAL EXAM:  BP (!) 141/87 (BP Location: Left Arm, Patient Position: Sitting)   Pulse (!) 116   Temp (!) 97.3 F (36.3 C) (Tympanic)   Resp 18   Wt 150 lb 11 oz (68.3 kg)   BMI 22.25 kg/m  Well-developed well-nourished patient in NAD. HEENT reveals PERLA, EOMI, discs not visualized.  Oral cavity is clear. No oral mucosal lesions are identified. Neck is clear without evidence of cervical or supraclavicular adenopathy. Lungs are clear to A&P. Cardiac examination is essentially unremarkable with regular rate and rhythm without murmur rub or thrill. Abdomen is benign with no organomegaly or masses noted. Motor sensory and DTR levels are equal and symmetric in the upper and lower extremities. Cranial nerves II through XII are grossly intact. Proprioception is intact. No peripheral adenopathy or edema is identified. No motor or sensory levels are noted. Crude visual fields are within normal range.  RADIOLOGY RESULTS:  Plain film the chest reviewed and compatible with above-stated findings  PLAN: Present time he is stable continues to play golf quite actively.  He is having no dysphasia.  Cough has improved.  There is been tumor response based on recent chest x-ray results.  I have asked to see back in 3 to 4 months.  He is already scheduled for follow-up CT scans.  Patient knows to call at anytime with any concerns.  I would like to take this opportunity to thank you for allowing me to participate in the care of your patient.Noreene Filbert, MD

## 2019-02-23 ENCOUNTER — Telehealth: Payer: Self-pay | Admitting: Internal Medicine

## 2019-02-23 NOTE — Telephone Encounter (Signed)
Received record request from [facility]. Request has been faxed to [employer].

## 2019-02-25 ENCOUNTER — Telehealth: Payer: Self-pay

## 2019-02-25 NOTE — Telephone Encounter (Signed)
Received another request for records from [facility] for pathology records. Request faxed to [employer].

## 2019-03-07 ENCOUNTER — Other Ambulatory Visit: Payer: Self-pay | Admitting: Radiation Oncology

## 2019-03-11 ENCOUNTER — Ambulatory Visit
Admission: RE | Admit: 2019-03-11 | Discharge: 2019-03-11 | Disposition: A | Discharge status: Home / Self Care | Payer: Medicare Other | Source: Ambulatory Visit | Attending: Oncology | Admitting: Oncology

## 2019-03-11 ENCOUNTER — Other Ambulatory Visit: Payer: Self-pay

## 2019-03-11 DIAGNOSIS — C7A8 Other malignant neuroendocrine tumors: Secondary | ICD-10-CM

## 2019-03-11 MED ORDER — IOHEXOL 300 MG/ML  SOLN
75.0000 mL | Freq: Once | INTRAMUSCULAR | Status: AC | PRN
  Administered 2019-03-11: 10:00:00 75 mL via INTRAVENOUS

## 2019-03-14 ENCOUNTER — Other Ambulatory Visit: Payer: Self-pay

## 2019-03-14 ENCOUNTER — Inpatient Hospital Stay: Payer: Medicare Other | Attending: Oncology | Admitting: *Deleted

## 2019-03-14 DIAGNOSIS — C7A1 Malignant poorly differentiated neuroendocrine tumors: Secondary | ICD-10-CM | POA: Insufficient documentation

## 2019-03-14 DIAGNOSIS — C7A8 Other malignant neuroendocrine tumors: Secondary | ICD-10-CM

## 2019-03-14 LAB — CBC WITH DIFFERENTIAL/PLATELET
Abs Immature Granulocytes: 0.04 10*3/uL (ref 0.00–0.07)
Basophils Absolute: 0.1 10*3/uL (ref 0.0–0.1)
Basophils Relative: 1 %
Eosinophils Absolute: 0.3 10*3/uL (ref 0.0–0.5)
Eosinophils Relative: 4 %
HCT: 39.5 % (ref 39.0–52.0)
Hemoglobin: 13 g/dL (ref 13.0–17.0)
Immature Granulocytes: 1 %
Lymphocytes Relative: 20 %
Lymphs Abs: 1.5 10*3/uL (ref 0.7–4.0)
MCH: 31.3 pg (ref 26.0–34.0)
MCHC: 32.9 g/dL (ref 30.0–36.0)
MCV: 95 fL (ref 80.0–100.0)
Monocytes Absolute: 0.9 10*3/uL (ref 0.1–1.0)
Monocytes Relative: 12 %
Neutro Abs: 4.7 10*3/uL (ref 1.7–7.7)
Neutrophils Relative %: 62 %
Platelets: 291 10*3/uL (ref 150–400)
RBC: 4.16 MIL/uL — ABNORMAL LOW (ref 4.22–5.81)
RDW: 13.4 % (ref 11.5–15.5)
WBC: 7.5 10*3/uL (ref 4.0–10.5)
nRBC: 0 % (ref 0.0–0.2)

## 2019-03-14 LAB — COMPREHENSIVE METABOLIC PANEL
ALT: 27 U/L (ref 0–44)
AST: 29 U/L (ref 15–41)
Albumin: 3.8 g/dL (ref 3.5–5.0)
Alkaline Phosphatase: 109 U/L (ref 38–126)
Anion gap: 7 (ref 5–15)
BUN: 12 mg/dL (ref 8–23)
CO2: 27 mmol/L (ref 22–32)
Calcium: 9.2 mg/dL (ref 8.9–10.3)
Chloride: 104 mmol/L (ref 98–111)
Creatinine, Ser: 1.17 mg/dL (ref 0.61–1.24)
GFR calc Af Amer: >60 mL/min (ref >60)
GFR calc non Af Amer: >60 mL/min (ref >60)
Glucose, Bld: 117 mg/dL — ABNORMAL HIGH (ref 70–99)
Potassium: 4.6 mmol/L (ref 3.5–5.1)
Sodium: 138 mmol/L (ref 135–145)
Total Bilirubin: 0.6 mg/dL (ref 0.3–1.2)
Total Protein: 7.9 g/dL (ref 6.5–8.1)

## 2019-03-15 ENCOUNTER — Inpatient Hospital Stay (HOSPITAL_BASED_OUTPATIENT_CLINIC_OR_DEPARTMENT_OTHER): Payer: Medicare Other | Admitting: Oncology

## 2019-03-15 ENCOUNTER — Inpatient Hospital Stay: Payer: Medicare Other

## 2019-03-15 ENCOUNTER — Encounter: Payer: Self-pay | Admitting: Oncology

## 2019-03-15 ENCOUNTER — Other Ambulatory Visit: Payer: Self-pay

## 2019-03-15 DIAGNOSIS — R222 Localized swelling, mass and lump, trunk: Secondary | ICD-10-CM | POA: Diagnosis not present

## 2019-03-15 DIAGNOSIS — R05 Cough: Secondary | ICD-10-CM | POA: Diagnosis not present

## 2019-03-15 DIAGNOSIS — J439 Emphysema, unspecified: Secondary | ICD-10-CM

## 2019-03-15 DIAGNOSIS — C7A8 Other malignant neuroendocrine tumors: Secondary | ICD-10-CM | POA: Diagnosis not present

## 2019-03-15 DIAGNOSIS — R059 Cough, unspecified: Secondary | ICD-10-CM

## 2019-03-15 NOTE — Progress Notes (Signed)
Called patient today for Telehealth visit via [place].  Patient c/o continued cough at night and morning, completed mucinex, which was helping.

## 2019-03-19 NOTE — Progress Notes (Signed)
HEMATOLOGY-ONCOLOGY TeleHEALTH VISIT PROGRESS NOTE  I connected with Joseph Barron on 03/19/19 at  2:30 PM EDT by video enabled telemedicine visit and verified that I am speaking with the correct person using two identifiers. I discussed the limitations, risks, security and privacy concerns of performing an evaluation and management service by telemedicine and the availability of in-person appointments. I also discussed with the patient that there may be a patient responsible charge related to this service. The patient expressed understanding and agreed to proceed.   Other persons participating in the visit and their role in the encounter:  Geraldine Solar, [place], check in patient    Patient's location: Home  Provider's location: work Chief Complaint: Follow-up for management of large cell neuroendocrine carcinoma of lung.   INTERVAL HISTORY Joseph Barron is a 68 y.o. male who has above history reviewed by me today presents for follow up visit for management of large cell neuroendocrine carcinoma of lung. Problems and complaints are listed below:  Patient reports doing okay.  Continues to have cough, nonproductive.  Chronic shortness of breath exacerbated with exertion.  No alleviating factors.. Appetite is okay.  Denies any headache, fever, chills, chest pain or abdominal pain.  Review of Systems  Constitutional: Positive for fatigue. Negative for appetite change, chills, fever and unexpected weight change.  HENT:   Negative for hearing loss and voice change.   Eyes: Negative for eye problems and icterus.  Respiratory: Positive for cough and shortness of breath. Negative for chest tightness.   Cardiovascular: Negative for chest pain and leg swelling.  Gastrointestinal: Negative for abdominal distention and abdominal pain.  Endocrine: Negative for hot flashes.  Genitourinary: Negative for difficulty urinating, dysuria and frequency.   Musculoskeletal: Negative for arthralgias.  Skin:  Negative for itching and rash.  Neurological: Negative for light-headedness and numbness.  Hematological: Negative for adenopathy. Does not bruise/bleed easily.  Psychiatric/Behavioral: Negative for confusion.    Past Medical History:  Diagnosis Date  . 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 GERD (gastroesophageal reflux disease)   . Hyperlipemia   . Hypertension   . Non-small cell lung cancer ([place]) 12/2017   surgical resection 02/23/2018.  . Pulmonary nodules 01/13/2018  . Vaccine counseling 12/22/2017   Past Surgical History:  Procedure Laterality Date  . COLONOSCOPY WITH PROPOFOL N/A 02/22/2018   Procedure: COLONOSCOPY WITH PROPOFOL;  Surgeon: Manya Silvas, MD;  Location: [facility] ENDOSCOPY;  Service: Endoscopy;  Laterality: N/A;  . EYE SURGERY Left child  . THORACOTOMY/LOBECTOMY Right 02/23/2018   Procedure: THORACOTOMY/ WEDGE RESECTION;  Surgeon: Nestor Lewandowsky, MD;  Location: [facility] ORS;  Service: Thoracic;  Laterality: Right;  Marland Kitchen VIDEO BRONCHOSCOPY N/A 02/23/2018   Procedure: PREOP BRONCHOSCOPY;  Surgeon: Nestor Lewandowsky, MD;  Location: [facility] ORS;  Service: Thoracic;  Laterality: N/A;    Family History  Problem Relation Age of Onset  . Arthritis Mother   . COPD Father   . Coronary artery disease Father   . Transient ischemic attack Father   . Alcoholism Father   . Bowel Disease Father   . COPD Sister   . Prostatitis Brother   . Thyroid disease Sister   . Thyroid disease Sister   . Liver cancer Brother   . Diabetes Brother     Social History   Socioeconomic History  . Marital status: Married    Spouse name: Not on file  . Number of children: Not on file  . Years of education: Not on file  . Highest education level: Not  on file  Occupational History  . Not on file  Social Needs  . Financial resource strain: Not on file  . Food insecurity:    Worry: Not on file    Inability: Not on file  . Transportation needs:    Medical: Not on file    Non-medical: Not on file   Tobacco Use  . Smoking status: Former Smoker    Packs/day: 1.00    Years: 50.00    Pack years: 50.00    Types: Cigarettes    Last attempt to quit: 11/17/2017    Years since quitting: 1.3  . Smokeless tobacco: Former Systems developer    Types: [place] date: 01/15/1989  Substance and Sexual Activity  . Alcohol use: Yes    Alcohol/week: 9.0 standard drinks    Types: 7 Cans of beer, 2 Shots of liquor per week  . Drug use: Never  . Sexual activity: Yes  Lifestyle  . Physical activity:    Days per week: Not on file    Minutes per session: Not on file  . Stress: Not on file  Relationships  . Social connections:    Talks on phone: Not on file    Gets together: Not on file    Attends religious service: Not on file    Active member of club or organization: Not on file    Attends meetings of clubs or organizations: Not on file    Relationship status: Not on file  . Intimate partner violence:    Fear of current or ex partner: Not on file    Emotionally abused: Not on file    Physically abused: Not on file    Forced sexual activity: Not on file  Other Topics Concern  . Not on file  Social History Narrative  . Not on file    Current Outpatient Medications on File Prior to Visit  Medication Sig Dispense Refill  . atorvastatin (LIPITOR) 10 MG tablet Take 10 mg by mouth at bedtime.     Marland Kitchen lisinopril (PRINIVIL,ZESTRIL) 20 MG tablet Take 20 mg by mouth daily.    Marland Kitchen omeprazole (PRILOSEC) 40 MG capsule Take by mouth daily.  11  . sucralfate (CARAFATE) 1 g tablet Take 1 tablet (1 g total) by mouth 3 (three) times daily. Dissolve in 3-4 tbsp warm water, swish and swallow. 90 tablet 3   No current facility-administered medications on file prior to visit.     No Known Allergies     Observations/Objective: Today's Vitals   03/15/19 1422  PainSc: 0-No pain   There is no height or weight on file to calculate BMI.  Physical Exam  Constitutional: He is oriented to person, place, and time. No  distress.  HENT:  Head: Normocephalic and atraumatic.  Pulmonary/Chest: Effort normal.  Neurological: He is alert and oriented to person, place, and time.  Psychiatric: Affect normal.    CBC    Component Value Date/Time   WBC 7.5 03/14/2019 1333   RBC 4.16 (L) 03/14/2019 1333   HGB 13.0 03/14/2019 1333   HCT 39.5 03/14/2019 1333   PLT 291 03/14/2019 1333   MCV 95.0 03/14/2019 1333   MCH 31.3 03/14/2019 1333   MCHC 32.9 03/14/2019 1333   RDW 13.4 03/14/2019 1333   LYMPHSABS 1.5 03/14/2019 1333   MONOABS 0.9 03/14/2019 1333   EOSABS 0.3 03/14/2019 1333   BASOSABS 0.1 03/14/2019 1333    CMP     Component Value Date/Time   NA 138  03/14/2019 1333   K 4.6 03/14/2019 1333   CL 104 03/14/2019 1333   CO2 27 03/14/2019 1333   GLUCOSE 117 (H) 03/14/2019 1333   BUN 12 03/14/2019 1333   CREATININE 1.17 03/14/2019 1333   CALCIUM 9.2 03/14/2019 1333   PROT 7.9 03/14/2019 1333   ALBUMIN 3.8 03/14/2019 1333   AST 29 03/14/2019 1333   ALT 27 03/14/2019 1333   ALKPHOS 109 03/14/2019 1333   BILITOT 0.6 03/14/2019 1333   GFRNONAA >60 03/14/2019 1333   GFRAA >60 03/14/2019 1333  RADIOGRAPHIC STUDIES: I have personally reviewed the radiological images as listed and agreed with the findings in the report. 03/11/2019 CT chest with contrast Showed interval development of extensive fibrotic changes within the right middle lung reflecting radiation changes. There are 2 new subpleural nodule overlying the posterior right lower lobe which are nonspecific.  Findings concerning for pleural spread of disease. New infrahilar lymph node measuring 1.2 cm. Atherosclerosis and emphysema.  Assessment and Plan: 1. Large cell neuroendocrine carcinoma ([place])   2. Pleural nodule   3. Cough   4. Pulmonary emphysema, unspecified emphysema type ([place])     CT scan was independently reviewed by me and discussed with patient. Concerning for disease progression. I recommend patient to proceed with PET  scan. Patient previously declined any systemic treatment.  After lengthy discussion, he agrees with proceeding with PET scan for further evaluation and then discuss about his options.  Pulmonary emphysema/shortness of breath/cough recommend patient continue follow-up with pulmonology.  Follow Up Instructions: Follow-up after scan to go over results and discuss management.  Orders Placed This Encounter  Procedures  . NM PET Image Restag (PS) Skull Base To Thigh    Standing Status:   Future    Standing Expiration Date:   03/14/2020    Order Specific Question:   ** REASON FOR EXAM (FREE TEXT)    Answer:   cough, history of large cell neuroendocrine carcinoma of lung, s/p RT, new pleural nodules. ? progression    Order Specific Question:   If indicated for the ordered procedure, I authorize the administration of a radiopharmaceutical per Radiology protocol    Answer:   Yes    Order Specific Question:   Preferred imaging location?    Answer:   [facility]    Order Specific Question:   Radiology Contrast Protocol - do NOT remove file path    Answer:   [email or web address]    I discussed the assessment and treatment plan with the patient. The patient was provided an opportunity to ask questions and all were answered. The patient agreed with the plan and demonstrated an understanding of the instructions.  The patient was advised to call back or seek an in-person evaluation if the symptoms worsen or if the condition fails to improve as anticipated.   Earlie Server, MD 03/19/2019 3:00 PM

## 2019-03-22 ENCOUNTER — Other Ambulatory Visit: Payer: Self-pay

## 2019-03-22 ENCOUNTER — Ambulatory Visit
Admission: RE | Admit: 2019-03-22 | Discharge: 2019-03-22 | Disposition: A | Discharge status: Home / Self Care | Payer: Medicare Other | Source: Ambulatory Visit | Attending: Oncology | Admitting: Oncology

## 2019-03-22 DIAGNOSIS — I2510 Atherosclerotic heart disease of native coronary artery without angina pectoris: Secondary | ICD-10-CM | POA: Insufficient documentation

## 2019-03-22 DIAGNOSIS — C7A8 Other malignant neuroendocrine tumors: Secondary | ICD-10-CM | POA: Diagnosis present

## 2019-03-22 DIAGNOSIS — J439 Emphysema, unspecified: Secondary | ICD-10-CM | POA: Diagnosis not present

## 2019-03-22 DIAGNOSIS — K760 Fatty (change of) liver, not elsewhere classified: Secondary | ICD-10-CM | POA: Insufficient documentation

## 2019-03-22 DIAGNOSIS — R222 Localized swelling, mass and lump, trunk: Secondary | ICD-10-CM | POA: Insufficient documentation

## 2019-03-22 DIAGNOSIS — I700 Atherosclerosis of aorta: Secondary | ICD-10-CM | POA: Insufficient documentation

## 2019-03-22 LAB — GLUCOSE, CAPILLARY: Glucose-Capillary: 105 mg/dL — ABNORMAL HIGH (ref 70–99)

## 2019-03-22 MED ORDER — FLUDEOXYGLUCOSE F - 18 (FDG) INJECTION
7.8000 | Freq: Once | INTRAVENOUS | Status: AC | PRN
  Administered 2019-03-22: 8.08 via INTRAVENOUS

## 2019-03-25 ENCOUNTER — Telehealth: Payer: Self-pay | Admitting: Oncology

## 2019-03-28 ENCOUNTER — Inpatient Hospital Stay: Payer: Medicare Other | Attending: Oncology | Admitting: Oncology

## 2019-03-28 ENCOUNTER — Other Ambulatory Visit: Payer: Self-pay

## 2019-03-28 ENCOUNTER — Encounter: Payer: Self-pay | Admitting: Oncology

## 2019-03-28 DIAGNOSIS — C7A1 Malignant poorly differentiated neuroendocrine tumors: Secondary | ICD-10-CM

## 2019-03-28 DIAGNOSIS — Z7189 Other specified counseling: Secondary | ICD-10-CM

## 2019-03-28 DIAGNOSIS — C7A8 Other malignant neuroendocrine tumors: Secondary | ICD-10-CM

## 2019-03-28 NOTE — Progress Notes (Signed)
Patient contacted for telehealth visit. Pt concerned because he is having a lot of aching pain to left shoulder.

## 2019-03-28 NOTE — Progress Notes (Signed)
HEMATOLOGY-ONCOLOGY TeleHEALTH VISIT PROGRESS NOTE  I connected with Joseph Barron on 03/28/19 at  8:30 AM EDT by video enabled telemedicine visit and verified that I am speaking with the correct person using two identifiers. I discussed the limitations, risks, security and privacy concerns of performing an evaluation and management service by telemedicine and the availability of in-person appointments. I also discussed with the patient that there may be a patient responsible charge related to this service. The patient expressed understanding and agreed to proceed.   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   INTERVAL HISTORY Joseph Barron is a 68 y.o. male who has above history reviewed by me today presents for follow up visit for management of large cell neuroendocrine carcinoma of lung. Problems and complaints are listed below:  During the interval patient has had PET scan done present to discuss PET scan results and management plan. He continues to have cough, nonproductive.  Chronic shortness of breath exacerbated with exertion. No alleviating factors. Appetite is fine.  Denies any headache, fever, chills.  Denies chest pain or abdominal pain .  Review of Systems  Constitutional: Positive for fatigue. Negative for appetite change, chills, fever and unexpected weight change.  HENT:   Negative for hearing loss and voice change.   Eyes: Negative for eye problems and icterus.  Respiratory: Positive for cough and shortness of breath. Negative for chest tightness.   Cardiovascular: Negative for chest pain and leg swelling.  Gastrointestinal: Negative for abdominal distention and abdominal pain.  Endocrine: Negative for hot flashes.  Genitourinary: Negative for difficulty  urinating, dysuria and frequency.   Musculoskeletal: Negative for arthralgias.  Skin: Negative for itching and rash.  Neurological: Negative for light-headedness and numbness.  Hematological: Negative for adenopathy. Does not bruise/bleed easily.  Psychiatric/Behavioral: Negative for confusion.    Past Medical History:  Diagnosis Date  . 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 GERD (gastroesophageal reflux disease)   . Hyperlipemia   . Hypertension   . Non-small cell lung cancer ([place]) 12/2017   surgical resection 02/23/2018.  . Pulmonary nodules 01/13/2018  . Vaccine counseling 12/22/2017   Past Surgical History:  Procedure Laterality Date  . COLONOSCOPY WITH PROPOFOL N/A 02/22/2018   Procedure: COLONOSCOPY WITH PROPOFOL;  Surgeon: Manya Silvas, MD;  Location: [facility] ENDOSCOPY;  Service: Endoscopy;  Laterality: N/A;  . EYE SURGERY Left child  . THORACOTOMY/LOBECTOMY Right 02/23/2018   Procedure: THORACOTOMY/ WEDGE RESECTION;  Surgeon: Nestor Lewandowsky, MD;  Location: [facility] ORS;  Service: Thoracic;  Laterality: Right;  Marland Kitchen VIDEO BRONCHOSCOPY N/A 02/23/2018   Procedure: PREOP BRONCHOSCOPY;  Surgeon: Nestor Lewandowsky, MD;  Location: [facility] ORS;  Service: Thoracic;  Laterality: N/A;    Family History  Problem Relation Age of Onset  . Arthritis Mother   . COPD Father   . Coronary artery disease Father   . Transient ischemic attack Father   . Alcoholism Father   . Bowel Disease Father   . COPD Sister   . Prostatitis Brother   . Thyroid disease Sister   . Thyroid disease Sister   . Liver cancer Brother   . Diabetes Brother     Social History   Socioeconomic History  . Marital status: Married    Spouse name: Not on file  . Number of children: Not  on file  . Years of education: Not on file  . Highest education level: Not on file  Occupational History  . Not on file  Social Needs  . Financial resource strain: Not on file  . Food insecurity:    Worry: Not on file    Inability: Not  on file  . Transportation needs:    Medical: Not on file    Non-medical: Not on file  Tobacco Use  . Smoking status: Former Smoker    Packs/day: 1.00    Years: 50.00    Pack years: 50.00    Types: Cigarettes    Last attempt to quit: 11/17/2017    Years since quitting: 1.3  . Smokeless tobacco: Former Systems developer    Types: [place] date: 01/15/1989  Substance and Sexual Activity  . Alcohol use: Yes    Alcohol/week: 9.0 standard drinks    Types: 7 Cans of beer, 2 Shots of liquor per week  . Drug use: Never  . Sexual activity: Yes  Lifestyle  . Physical activity:    Days per week: Not on file    Minutes per session: Not on file  . Stress: Not on file  Relationships  . Social connections:    Talks on phone: Not on file    Gets together: Not on file    Attends religious service: Not on file    Active member of club or organization: Not on file    Attends meetings of clubs or organizations: Not on file    Relationship status: Not on file  . Intimate partner violence:    Fear of current or ex partner: Not on file    Emotionally abused: Not on file    Physically abused: Not on file    Forced sexual activity: Not on file  Other Topics Concern  . Not on file  Social History Narrative  . Not on file    Current Outpatient Medications on File Prior to Visit  Medication Sig Dispense Refill  . atorvastatin (LIPITOR) 10 MG tablet Take 10 mg by mouth at bedtime.     Marland Kitchen lisinopril (PRINIVIL,ZESTRIL) 20 MG tablet Take 20 mg by mouth daily.    Marland Kitchen omeprazole (PRILOSEC) 40 MG capsule Take by mouth daily.  11  . sucralfate (CARAFATE) 1 g tablet Take 1 tablet (1 g total) by mouth 3 (three) times daily. Dissolve in 3-4 tbsp warm water, swish and swallow. (Patient not taking: Reported on 03/28/2019) 90 tablet 3   No current facility-administered medications on file prior to visit.     No Known Allergies     Observations/Objective: Today's Vitals   03/28/19 0836  PainSc: 0-No pain   There is  no height or weight on file to calculate BMI.  Physical Exam  Constitutional: He is oriented to person, place, and time. No distress.  HENT:  Head: Normocephalic and atraumatic.  Pulmonary/Chest: Effort normal.  Neurological: He is alert and oriented to person, place, and time.  Psychiatric: Affect normal.    CBC    Component Value Date/Time   WBC 7.5 03/14/2019 1333   RBC 4.16 (L) 03/14/2019 1333   HGB 13.0 03/14/2019 1333   HCT 39.5 03/14/2019 1333   PLT 291 03/14/2019 1333   MCV 95.0 03/14/2019 1333   MCH 31.3 03/14/2019 1333   MCHC 32.9 03/14/2019 1333   RDW 13.4 03/14/2019 1333   LYMPHSABS 1.5 03/14/2019 1333   MONOABS 0.9 03/14/2019 1333   EOSABS 0.3 03/14/2019  1333   BASOSABS 0.1 03/14/2019 1333    CMP     Component Value Date/Time   NA 138 03/14/2019 1333   K 4.6 03/14/2019 1333   CL 104 03/14/2019 1333   CO2 27 03/14/2019 1333   GLUCOSE 117 (H) 03/14/2019 1333   BUN 12 03/14/2019 1333   CREATININE 1.17 03/14/2019 1333   CALCIUM 9.2 03/14/2019 1333   PROT 7.9 03/14/2019 1333   ALBUMIN 3.8 03/14/2019 1333   AST 29 03/14/2019 1333   ALT 27 03/14/2019 1333   ALKPHOS 109 03/14/2019 1333   BILITOT 0.6 03/14/2019 1333   GFRNONAA >60 03/14/2019 1333   GFRAA >60 03/14/2019 1333  RADIOGRAPHIC STUDIES: I have personally reviewed the radiological images as listed and agreed with the findings in the report. 03/11/2019 CT chest with contrast Showed interval development of extensive fibrotic changes within the right middle lung reflecting radiation changes. There are 2 new subpleural nodule overlying the posterior right lower lobe.  Findings concerning for pleural spread of disease. New infrahilar lymph node measuring 1.2 cm. Atherosclerosis and emphysema.  Wound  03/22/2019 PET scan.  1. Hypermetabolism corresponding to the area of right lower lobe pleural-based nodularity. This is most consistent with metastatic disease. 2. Decrease in size and hypermetabolism of  nodule along the right upper lobe surgical sutures when compared to 10/05/2018 PET. 3. Patchy hypermetabolism throughout the right upper lobe, corresponding to areas of interstitial thickening and consolidation. Although this could all be radiation induced, concurrent lymphangitic tumor spread cannot be excluded. 4. No evidence of extrathoracic metastatic disease. 5. Hepatic steatosis.6. Aortic atherosclerosis (ICD10-I70.0), coronary artery continue. Here atherosclerosis and emphysema (ICD10-J43.9).    Assessment and Plan: 1. Large cell neuroendocrine carcinoma ([place])   2. Goals of care, counseling/discussion     PET scan images were independently reviewed by me and discussed with patient.  Patient has developed new hypermetabolic area right lower lobe pleural based nodularity.  Consistent with metastatic disease. Decrease in size and hypermetabolic him of nodule along right upper lobe surgical suture. Patchy hypermetabolism throughout right upper lobe, corresponding to the area of interstitial thickening and consolidation.  Questionable radiation-induced versus concurrent lymphangitic tumor spread cannot be excluded. No evidence of extra thoracic metastatic disease.  I discussed with patient about disease recurrence.  I recommend patient to start systemic chemotherapy. Patient declined as he feels nothing he has done so far has stopped cancer coming back.  He is not willing to proceed with any further chemotherapy treatment. I recommend him to repeat MRI brain which he agreed. Goal of care was discussed.  If he is not willing to proceed with any additional systemic chemotherapy, I recommend patient to establish care with palliative care and discuss hospice.  Patient does not want anything to be scheduled before April 16, 2019.  Okay to talk to palliative care after that date.   Follow Up Instructions: 4 weeks.  Orders Placed This Encounter  Procedures  . MR Brain W Wo Contrast     Standing Status:   Future    Standing Expiration Date:   03/27/2020    Order Specific Question:   ** REASON FOR EXAM (FREE TEXT)    Answer:   recurrent lung cancer follow up    Order Specific Question:   If indicated for the ordered procedure, I authorize the administration of contrast media per Radiology protocol    Answer:   Yes    Order Specific Question:   What is the patient's sedation requirement?  Answer:   No Sedation    Order Specific Question:   Does the patient have a pacemaker or implanted devices?    Answer:   No    Order Specific Question:   Use SRS Protocol?    Answer:   No    Order Specific Question:   Radiology Contrast Protocol - do NOT remove file path    Answer:   [email or web address]    Order Specific Question:   Preferred imaging location?    Answer:   [facility] (table limit-400lbs)  . Ambulatory Referral to Palliative Care    Referral Priority:   Routine    Referral Type:   Consultation    Referred to Provider:   Borders, Kirt Boys, NP    Number of Visits Requested:   1    I discussed the assessment and treatment plan with the patient. The patient was provided an opportunity to ask questions and all were answered. The patient agreed with the plan and demonstrated an understanding of the instructions.  The patient was advised to call back or seek an in-person evaluation if the symptoms worsen or if the condition fails to improve as anticipated.   Earlie Server, MD

## 2019-03-31 ENCOUNTER — Telehealth: Payer: Self-pay | Admitting: *Deleted

## 2019-03-31 ENCOUNTER — Other Ambulatory Visit: Payer: Medicare Other

## 2019-03-31 NOTE — Telephone Encounter (Signed)
I do not know how to release results

## 2019-03-31 NOTE — Telephone Encounter (Signed)
Patient requests his PET scan results be released to My Chart

## 2019-03-31 NOTE — Telephone Encounter (Signed)
Please release. Thanks.

## 2019-03-31 NOTE — Telephone Encounter (Signed)
Ok to release

## 2019-04-01 NOTE — Progress Notes (Signed)
Tumor Board Documentation  Joseph Barron was presented by Dr Tasia Catchings at our Tumor Board on 03/31/2019, which included representatives from medical oncology, radiation oncology, surgical, radiology, pathology, navigation, internal medicine, research.  Joseph Barron currently presents as a current patient, for new tumor(s) with history of the following treatments: active survellience, neoadjuvant radiation.  Additionally, we reviewed previous medical and familial history, history of present illness, and recent lab results along with all available histopathologic and imaging studies. The tumor board considered available treatment options and made the following recommendations: Additional screening Repeat CT in 3 months  The following procedures/referrals were also placed: No orders of the defined types were placed in this encounter.   Clinical Trial Status: not discussed   Staging used: Clinical Stage  AJCC Staging: T: cT1b N: NX M: M0 Group: Stage 1 A2 NET of Lung   National site-specific guidelines   were discussed with respect to the case.  Tumor board is a meeting of clinicians from various specialty areas who evaluate and discuss patients for whom a multidisciplinary approach is being considered. Final determinations in the plan of care are those of the provider(s). The responsibility for follow up of recommendations given during tumor board is that of the provider.   Today's extended care, comprehensive team conference, Joseph Barron was not present for the discussion and was not examined.   Multidisciplinary Tumor Board is a multidisciplinary case peer review process.  Decisions discussed in the Multidisciplinary Tumor Board reflect the opinions of the specialists present at the conference without having examined the patient.  Ultimately, treatment and diagnostic decisions rest with the primary provider(s) and the patient.

## 2019-04-03 ENCOUNTER — Ambulatory Visit
Admission: RE | Admit: 2019-04-03 | Discharge: 2019-04-03 | Disposition: A | Discharge status: Home / Self Care | Payer: Medicare Other | Source: Ambulatory Visit | Attending: Oncology | Admitting: Oncology

## 2019-04-03 ENCOUNTER — Other Ambulatory Visit: Payer: Self-pay

## 2019-04-03 DIAGNOSIS — C7A8 Other malignant neuroendocrine tumors: Secondary | ICD-10-CM | POA: Diagnosis present

## 2019-04-03 MED ORDER — GADOBUTROL 1 MMOL/ML IV SOLN
6.0000 mL | Freq: Once | INTRAVENOUS | Status: AC | PRN
  Administered 2019-04-03: 6 mL via INTRAVENOUS

## 2019-04-25 ENCOUNTER — Other Ambulatory Visit: Payer: Self-pay | Admitting: *Deleted

## 2019-04-25 DIAGNOSIS — C7A8 Other malignant neuroendocrine tumors: Secondary | ICD-10-CM

## 2019-04-27 ENCOUNTER — Inpatient Hospital Stay (HOSPITAL_BASED_OUTPATIENT_CLINIC_OR_DEPARTMENT_OTHER): Payer: Medicare Other | Admitting: Hospice and Palliative Medicine

## 2019-04-27 ENCOUNTER — Other Ambulatory Visit: Payer: Self-pay

## 2019-04-27 ENCOUNTER — Encounter: Payer: Self-pay | Admitting: Oncology

## 2019-04-27 ENCOUNTER — Inpatient Hospital Stay (HOSPITAL_BASED_OUTPATIENT_CLINIC_OR_DEPARTMENT_OTHER): Payer: Medicare Other | Admitting: Oncology

## 2019-04-27 ENCOUNTER — Inpatient Hospital Stay: Payer: Medicare Other | Attending: Oncology

## 2019-04-27 VITALS — BP 161/98 | HR 103 | Temp 97.4°F | Resp 18 | Wt 152.1 lb

## 2019-04-27 DIAGNOSIS — Z79899 Other long term (current) drug therapy: Secondary | ICD-10-CM | POA: Insufficient documentation

## 2019-04-27 DIAGNOSIS — M25512 Pain in left shoulder: Secondary | ICD-10-CM

## 2019-04-27 DIAGNOSIS — Z515 Encounter for palliative care: Secondary | ICD-10-CM | POA: Diagnosis not present

## 2019-04-27 DIAGNOSIS — C7A8 Other malignant neuroendocrine tumors: Secondary | ICD-10-CM

## 2019-04-27 DIAGNOSIS — C7A1 Malignant poorly differentiated neuroendocrine tumors: Secondary | ICD-10-CM

## 2019-04-27 DIAGNOSIS — K219 Gastro-esophageal reflux disease without esophagitis: Secondary | ICD-10-CM | POA: Insufficient documentation

## 2019-04-27 DIAGNOSIS — E785 Hyperlipidemia, unspecified: Secondary | ICD-10-CM

## 2019-04-27 DIAGNOSIS — R222 Localized swelling, mass and lump, trunk: Secondary | ICD-10-CM

## 2019-04-27 DIAGNOSIS — I10 Essential (primary) hypertension: Secondary | ICD-10-CM

## 2019-04-27 DIAGNOSIS — Z8673 Personal history of transient ischemic attack (TIA), and cerebral infarction without residual deficits: Secondary | ICD-10-CM

## 2019-04-27 DIAGNOSIS — Z87891 Personal history of nicotine dependence: Secondary | ICD-10-CM

## 2019-04-27 DIAGNOSIS — J439 Emphysema, unspecified: Secondary | ICD-10-CM

## 2019-04-27 DIAGNOSIS — Z7189 Other specified counseling: Secondary | ICD-10-CM

## 2019-04-27 DIAGNOSIS — C3491 Malignant neoplasm of unspecified part of right bronchus or lung: Secondary | ICD-10-CM

## 2019-04-27 DIAGNOSIS — C3481 Malignant neoplasm of overlapping sites of right bronchus and lung: Secondary | ICD-10-CM

## 2019-04-27 LAB — COMPREHENSIVE METABOLIC PANEL
ALT: 23 U/L (ref 0–44)
AST: 26 U/L (ref 15–41)
Albumin: 4 g/dL (ref 3.5–5.0)
Alkaline Phosphatase: 94 U/L (ref 38–126)
Anion gap: 10 (ref 5–15)
BUN: 19 mg/dL (ref 8–23)
CO2: 25 mmol/L (ref 22–32)
Calcium: 9.6 mg/dL (ref 8.9–10.3)
Chloride: 102 mmol/L (ref 98–111)
Creatinine, Ser: 1.16 mg/dL (ref 0.61–1.24)
GFR calc Af Amer: >60 mL/min (ref >60)
GFR calc non Af Amer: >60 mL/min (ref >60)
Glucose, Bld: 98 mg/dL (ref 70–99)
Potassium: 4.4 mmol/L (ref 3.5–5.1)
Sodium: 137 mmol/L (ref 135–145)
Total Bilirubin: 0.5 mg/dL (ref 0.3–1.2)
Total Protein: 8.5 g/dL — ABNORMAL HIGH (ref 6.5–8.1)

## 2019-04-27 LAB — CBC WITH DIFFERENTIAL/PLATELET
Abs Immature Granulocytes: 0.05 10*3/uL (ref 0.00–0.07)
Basophils Absolute: 0.1 10*3/uL (ref 0.0–0.1)
Basophils Relative: 1 %
Eosinophils Absolute: 0.3 10*3/uL (ref 0.0–0.5)
Eosinophils Relative: 3 %
HCT: 40.3 % (ref 39.0–52.0)
Hemoglobin: 13.6 g/dL (ref 13.0–17.0)
Immature Granulocytes: 1 %
Lymphocytes Relative: 21 %
Lymphs Abs: 2 10*3/uL (ref 0.7–4.0)
MCH: 31.2 pg (ref 26.0–34.0)
MCHC: 33.7 g/dL (ref 30.0–36.0)
MCV: 92.4 fL (ref 80.0–100.0)
Monocytes Absolute: 1 10*3/uL (ref 0.1–1.0)
Monocytes Relative: 11 %
Neutro Abs: 6.2 10*3/uL (ref 1.7–7.7)
Neutrophils Relative %: 63 %
Platelets: 315 10*3/uL (ref 150–400)
RBC: 4.36 MIL/uL (ref 4.22–5.81)
RDW: 13.1 % (ref 11.5–15.5)
WBC: 9.6 10*3/uL (ref 4.0–10.5)
nRBC: 0 % (ref 0.0–0.2)

## 2019-04-27 MED ORDER — TRAMADOL HCL 50 MG PO TABS: 25.0000 mg | ORAL_TABLET | Freq: Four times a day (QID) | ORAL | 0 refills | Status: DC | PRN

## 2019-04-27 NOTE — Progress Notes (Signed)
Patient here for follow up. Pt complains of left shoulder/ arm pain that doesn't let him sleep. Pt brought FMLA paperwork for daughter Lattie Haw, paperwork emailed to [place].

## 2019-04-27 NOTE — Progress Notes (Signed)
[facility]  [phone] [phone]   Name: Joseph Barron Date: 04/27/2019 MRN: [id number]  DOB: 1951-03-29  Patient Care Team: Dion Body, MD as PCP - General (Family Medicine) Telford Nab, RN as Registered Nurse    REASON FOR CONSULTATION: Palliative Care consult requested for this 68 y.o. 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  PET scan on 03/22/2019 revealed a hypermetabolic right lower lobe nodularity consistent with metastatic disease and patchy hypermetabolism throughout the right upper lobe, which was unclear if it was related to radiation changes or lymphangitic tumor spread.  Systemic chemotherapy was recommended but patient declined.  He was referred to palliative care to help address goals and manage ongoing symptoms.   SOCIAL HISTORY:     reports that he quit smoking about 17 months ago. His smoking use included cigarettes. He has a 50.00 pack-year smoking history. He quit smokeless tobacco use about 30 years ago.  His smokeless tobacco use included chew. He reports current alcohol use of about 9.0 standard drinks of alcohol per week. He reports that he does not use drugs.  Patient is divorced.  He currently lives at home with his daughter and son-in-law.  He has a stepson whom he raised.  ADVANCE DIRECTIVES:  Does not have  CODE STATUS:   PAST MEDICAL HISTORY: Past Medical History:  Diagnosis Date  . 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 GERD (gastroesophageal reflux disease)   . Hyperlipemia   . Hypertension   . Non-small cell lung cancer ([place]) 12/2017   surgical resection 02/23/2018.  . Pulmonary nodules 01/13/2018  . Vaccine counseling 12/22/2017    PAST SURGICAL HISTORY:  Past Surgical History:  Procedure Laterality Date  . COLONOSCOPY WITH PROPOFOL N/A 02/22/2018   Procedure:  COLONOSCOPY WITH PROPOFOL;  Surgeon: Manya Silvas, MD;  Location: [facility] ENDOSCOPY;  Service: Endoscopy;  Laterality: N/A;  . EYE SURGERY Left child  . THORACOTOMY/LOBECTOMY Right 02/23/2018   Procedure: THORACOTOMY/ WEDGE RESECTION;  Surgeon: Nestor Lewandowsky, MD;  Location: [facility] ORS;  Service: Thoracic;  Laterality: Right;  Marland Kitchen VIDEO BRONCHOSCOPY N/A 02/23/2018   Procedure: PREOP BRONCHOSCOPY;  Surgeon: Nestor Lewandowsky, MD;  Location: [facility] ORS;  Service: Thoracic;  Laterality: N/A;    HEMATOLOGY/ONCOLOGY HISTORY:  Oncology History  Large cell neuroendocrine carcinoma ([place])  02/08/2018 Initial Diagnosis   Primary adenocarcinoma of right lung ([place])   02/08/2018 Cancer Staging   Staging form: Lung, AJCC 8th Edition - Clinical: Stage IA2 (cT1b, cN0, cM0) - Signed by Earlie Server, MD on 02/08/2018   03/26/2018 Cancer Staging   Staging form: Lung, AJCC 8th Edition - Pathologic stage from 03/26/2018: Stage Unknown (pT1b, pNX, cM0) - Signed by Earlie Server, MD on 03/27/2018     ALLERGIES:  has No Known Allergies.  MEDICATIONS:  Current Outpatient Medications  Medication Sig Dispense Refill  . atorvastatin (LIPITOR) 10 MG tablet Take 10 mg by mouth at bedtime.     Marland Kitchen lisinopril (PRINIVIL,ZESTRIL) 20 MG tablet Take 20 mg by mouth daily.    Marland Kitchen omeprazole (PRILOSEC) 40 MG capsule Take by mouth daily.  11  . sucralfate (CARAFATE) 1 g tablet Take 1 tablet (1 g total) by mouth 3 (three) times daily. Dissolve in 3-4 tbsp warm water, swish and swallow. (Patient not taking: Reported on 03/28/2019) 90 tablet 3   No current facility-administered medications for this visit.     VITAL  SIGNS: There were no vitals taken for this visit. There were no vitals filed for this visit.  Estimated body mass index is 22.46 kg/m as calculated from the following:   Height as of 11/10/18: 5' 9"  (1.753 m).   Weight as of an earlier encounter on 04/27/19: 152 lb 1.6 oz (69 kg).  LABS: CBC:    Component Value Date/Time   WBC 9.6  04/27/2019 1002   HGB 13.6 04/27/2019 1002   HCT 40.3 04/27/2019 1002   PLT 315 04/27/2019 1002   MCV 92.4 04/27/2019 1002   NEUTROABS 6.2 04/27/2019 1002   LYMPHSABS 2.0 04/27/2019 1002   MONOABS 1.0 04/27/2019 1002   EOSABS 0.3 04/27/2019 1002   BASOSABS 0.1 04/27/2019 1002   Comprehensive Metabolic Panel:    Component Value Date/Time   NA 137 04/27/2019 1002   K 4.4 04/27/2019 1002   CL 102 04/27/2019 1002   CO2 25 04/27/2019 1002   BUN 19 04/27/2019 1002   CREATININE 1.16 04/27/2019 1002   GLUCOSE 98 04/27/2019 1002   CALCIUM 9.6 04/27/2019 1002   AST 26 04/27/2019 1002   ALT 23 04/27/2019 1002   ALKPHOS 94 04/27/2019 1002   BILITOT 0.5 04/27/2019 1002   PROT 8.5 (H) 04/27/2019 1002   ALBUMIN 4.0 04/27/2019 1002    RADIOGRAPHIC STUDIES: Mr Jeri Cos VO Contrast  Result Date: 04/03/2019 CLINICAL DATA:  Large-cell neuroendocrine carcinoma of the lung. Staging. EXAM: MRI HEAD WITHOUT AND WITH CONTRAST TECHNIQUE: Multiplanar, multiecho pulse sequences of the brain and surrounding structures were obtained without and with intravenous contrast. CONTRAST:  6 cc Gadavist intravenous COMPARISON:  10/14/2018 FINDINGS: Brain: No indication of metastatic disease. No infarction, hemorrhage, hydrocephalus, extra-axial collection or mass lesion. Remote lacunar infarct in the right thalamus. Minor chronic microvascular ischemic change in the cerebral white matter. Vascular: Major flow voids and vascular enhancements are preserved Skull and upper cervical spine: Negative for marrow lesion Sinuses/Orbits: Negative period Other: Probable retention cyst in the left oropharynx. No abnormal activity in this area on recent PET. IMPRESSION: Negative for intracranial metastasis. Electronically Signed   By: Monte Fantasia M.D.   On: 04/03/2019 19:47    PERFORMANCE STATUS (ECOG) : 1 - Symptomatic but completely ambulatory  Review of Systems Unless otherwise noted, a complete review of systems is  negative.  Physical Exam General: NAD, frail appearing, thin Cardiovascular: regular rate and rhythm Pulmonary: clear ant fields Abdomen: soft, nontender, + bowel sounds GU: no suprapubic tenderness Extremities: no edema, no joint deformities Skin: no rashes Neurological: Weakness but otherwise nonfocal  IMPRESSION: I met with patient today following his appointment with Dr. Tasia Catchings.  Introduced palliative care services and attempted establish therapeutic rapport.  Patient reports that he is doing reasonably well.  His only distressing symptom is chronic left shoulder and arm pain.  He has been offered pain medication in the past but has been reluctant due to concerns regarding addiction.  Patient has been smoking THC at bedtime to help him sleep and says that it significantly helps his pain.  We spoke at length today about pain management strategies.  Patient would be agreeable to trial of tramadol.  PDMP reviewed.  Patient says that his primary focus is on quality of life.  He thinks he would likely forego chemotherapy as he believes it would only extend his life for a short duration without meaningful improvement in quality.  Plan is for repeat imaging in August and then further discussion about treatment options and final determination  of the plan.  We did discuss option of home-based palliative care versus hospice depending upon his goals for treatment.  Patient says that he is familiar with hospice from the care of a sister.  Functionally, patient says that he is currently independent with all care.  He lives at home with his daughter, who provides him supportive needed.  Patient still drives and plays golf with friends.  We discussed advance care planning.  Patient would want his daughter to be his decision maker if necessary.  He does not currently have a healthcare power of attorney or living will, although I reviewed those documents with him today to take home and complete.  Patient does  not think that he would be interested in heroics at end-of-life.  I reviewed with him a MOST Form, which he also took home to discuss with his daughter.   PLAN: -Best supportive care -Trial of tramadol 25 to 50 mg every 6 hours as needed for pain (#45) -Prophylactic bowel regimen -MOST form reviewed and to be completed at future visit -ACP documents reviewed -RTC in 2 months   Patient expressed understanding and was in agreement with this plan. He also understands that He can call the clinic at any time with any questions, concerns, or complaints.     Time Total: 45 minutes  Visit consisted of counseling and education dealing with the complex and emotionally intense issues of symptom management and palliative care in the setting of serious and potentially life-threatening illness.Greater than 50%  of this time was spent counseling and coordinating care related to the above assessment and plan.  Signed by: Altha Harm, PhD, NP-C [phone] (Work Cell)

## 2019-04-27 NOTE — Progress Notes (Signed)
Hematology/Oncology Follow up note [facility] [phone] [phone]   Patient Care Team: Dion Body, MD as PCP - General (Family Medicine) Telford Nab, RN as Registered Nurse  REFERRING PROVIDER: Dion Body, MD  Lung [place] VISIT Follow up for lung adenocarcinoma.  HISTORY OF PRESENTING ILLNESS:  Joseph Barron is a  68 y.o.  male with PMH listed below who was referred to me for evaluation of lung mass.  Patient had CT chest lung cancer screening done on January 12, 2018 which showed there is a spiculated nodule with a volume derived mean diameter of 15.2 mm, which abuts the pleural surface, highly concerning for primary bronchogenic neoplasm.  He is former smoker, quit recently. 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  Reports mild shortness of breath with climbing stairs otherwise feeling well.  He has a chronic cough productive with clear sputum.  Denies any weight loss, headache, abdominal pain, lower extremity swelling.  Today he is accompanied by his daughter to clinic.  Patient's case was discussed on tumor conference on January 14, 2018.  Given his heavy smoking history/COPD/emphysema, risks excised for biopsy. Consensus decision was to obtain a PET scan, pulmonary lung function testing.  If his lung mass is hypermetabolic on PET scan and no other involvement, he will have surgical evaluation regarding possibilities of resection as well as obtaining diagnosis.  # 01/20/2018 PET scan showed low level hypermetabolism of right upper lobe lung nodule.  # 02/23/2018 patient is s/p right thoracotomy and wedge resection. Post surgical course complicated with prolong air leak/pnemothorax.  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   #  09/28/2018. CT chest abdomen pelvis  showed local recurrence with progressive multinodular thickening in the right upper lobe along the minor fissure and to a lesser extent along the upper margin of major fissure.  # 10/05/2018 PET showed findings consistent with recurrent tumor along the surgical resection bed in the right upper lobe as detailed above. There's also nodularity and interstitial thickening more posteriorly and inferiorly which is likely interstitial or endobronchial spread of tumor. MRI brain 10/14/2018 negative for metastatic disease.  # Patient declined chemotherapy.s/p IMRT to recurrent disease and finish 01/13/2019  INTERVAL HISTORY Joseph Barron is a 68 y.o. male who has above history reviewed by me today present for discussion of image results and management plan.  Daughter was called and put on speaker to participate in discussion.  Continues to have cough, shortness of breath with minimal exertion.  Appetite is ok.  Denies any fever, chills, chest pain or abdominal pain.    Review of Systems  Constitutional: Positive for fatigue. Negative for appetite change, chills, fever and unexpected weight change.  HENT:   Negative for hearing loss and voice change.   Eyes: Negative for eye problems and icterus.  Respiratory: Positive for cough and shortness of breath. Negative for chest tightness.   Cardiovascular: Negative for chest pain and leg swelling.  Gastrointestinal: Negative for abdominal distention and abdominal pain.  Endocrine: Negative for hot flashes.  Genitourinary: Negative for difficulty urinating, dysuria and frequency.   Musculoskeletal: Negative for arthralgias.  Skin: Negative for itching and rash.  Neurological: Negative for light-headedness and numbness.  Hematological: Negative for adenopathy. Does not bruise/bleed easily.  Psychiatric/Behavioral: Negative for confusion.     MEDICAL HISTORY:  Past Medical History:  Diagnosis  Date   Borderline diabetes mellitus 08/06/2017   GERD  (gastroesophageal reflux disease)    Hyperlipemia    Hypertension    Non-small cell lung cancer ([place]) 12/2017   surgical resection 02/23/2018.   Pulmonary nodules 01/13/2018   Vaccine counseling 12/22/2017    SURGICAL HISTORY: Past Surgical History:  Procedure Laterality Date   COLONOSCOPY WITH PROPOFOL N/A 02/22/2018   Procedure: COLONOSCOPY WITH PROPOFOL;  Surgeon: Manya Silvas, MD;  Location: [facility] ENDOSCOPY;  Service: Endoscopy;  Laterality: N/A;   EYE SURGERY Left child   THORACOTOMY/LOBECTOMY Right 02/23/2018   Procedure: THORACOTOMY/ WEDGE RESECTION;  Surgeon: Nestor Lewandowsky, MD;  Location: [facility] ORS;  Service: Thoracic;  Laterality: Right;   VIDEO BRONCHOSCOPY N/A 02/23/2018   Procedure: PREOP BRONCHOSCOPY;  Surgeon: Nestor Lewandowsky, MD;  Location: [facility] ORS;  Service: Thoracic;  Laterality: N/A;    SOCIAL HISTORY: Social History   Socioeconomic History   Marital status: Married    Spouse name: Not on file   Number of children: Not on file   Years of education: Not on file   Highest education level: Not on file  Occupational History   Not on file  Social Needs   Financial resource strain: Not on file   Food insecurity    Worry: Not on file    Inability: Not on file   Transportation needs    Medical: Not on file    Non-medical: Not on file  Tobacco Use   Smoking status: Former Smoker    Packs/day: 1.00    Years: 50.00    Pack years: 50.00    Types: Cigarettes    Quit date: 11/17/2017    Years since quitting: 1.4   Smokeless tobacco: Former Systems developer    Types: Chew    Quit date: 01/15/1989  Substance and Sexual Activity   Alcohol use: Yes    Alcohol/week: 9.0 standard drinks    Types: 7 Cans of beer, 2 Shots of liquor per week   Drug use: Never   Sexual activity: Yes  Lifestyle   Physical activity    Days per week: Not on file    Minutes per session: Not on file   Stress: Not on file  Relationships   Social connections    Talks on  phone: Not on file    Gets together: Not on file    Attends religious service: Not on file    Active member of club or organization: Not on file    Attends meetings of clubs or organizations: Not on file    Relationship status: Not on file   Intimate partner violence    Fear of current or ex partner: Not on file    Emotionally abused: Not on file    Physically abused: Not on file    Forced sexual activity: Not on file  Other Topics Concern   Not on file  Social History Narrative   Not on file    FAMILY HISTORY: Family History  Problem Relation Age of Onset   Arthritis Mother    COPD Father    Coronary artery disease Father    Transient ischemic attack Father    Alcoholism Father    Bowel Disease Father    COPD Sister    Prostatitis Brother    Thyroid disease Sister    Thyroid disease Sister    Liver cancer Brother    Diabetes Brother     ALLERGIES:  has No Known Allergies.  MEDICATIONS:  Current Outpatient Medications  Medication Sig Dispense Refill   atorvastatin (LIPITOR) 10 MG tablet Take 10 mg by mouth at bedtime.      lisinopril (PRINIVIL,ZESTRIL) 20 MG tablet Take 20 mg by mouth daily.     omeprazole (PRILOSEC) 40 MG capsule Take by mouth daily.  11   sucralfate (CARAFATE) 1 g tablet Take 1 tablet (1 g total) by mouth 3 (three) times daily. Dissolve in 3-4 tbsp warm water, swish and swallow. (Patient not taking: Reported on 03/28/2019) 90 tablet 3   traMADol (ULTRAM) 50 MG tablet Take 0.5-1 tablets (25-50 mg total) by mouth every 6 (six) hours as needed (pain). 45 tablet 0   No current facility-administered medications for this visit.      PHYSICAL EXAMINATION: ECOG PERFORMANCE STATUS: 1 - Symptomatic but completely ambulatory Vitals:   04/27/19 1019  BP: (!) 161/98  Pulse: (!) 103  Resp: 18  Temp: (!) 97.4 F (36.3 C)   Filed Weights   04/27/19 1019  Weight: 152 lb 1.6 oz (69 kg)    Physical Exam  Constitutional: He is oriented  to person, place, and time. No distress.  HENT:  Head: Normocephalic and atraumatic.  Mouth/Throat: No oropharyngeal exudate.  Eyes: Pupils are equal, round, and reactive to light. EOM are normal. Right eye exhibits no discharge. Left eye exhibits no discharge. No scleral icterus.  Neck: Normal range of motion. Neck supple. No JVD present.  Cardiovascular: Normal rate, regular rhythm and normal heart sounds.  No murmur heard. Pulmonary/Chest: Effort normal. No respiratory distress. He has no wheezes. He has no rales. He exhibits no tenderness.  Decreased breath sound bilaterally.   Abdominal: Soft. Bowel sounds are normal. He exhibits no distension. There is no abdominal tenderness.  Musculoskeletal: Normal range of motion.        General: No tenderness or edema.  Lymphadenopathy:    He has no cervical adenopathy.  Neurological: He is alert and oriented to person, place, and time.  Skin: Skin is warm and dry. No rash noted. He is not diaphoretic. No erythema.  Psychiatric: Affect normal.     LABORATORY DATA:  I have reviewed the data as listed Lab Results  Component Value Date   WBC 9.6 04/27/2019   HGB 13.6 04/27/2019   HCT 40.3 04/27/2019   MCV 92.4 04/27/2019   PLT 315 04/27/2019   Recent Labs    12/24/18 0923 03/14/19 1333 04/27/19 1002  NA 137 138 137  K 4.6 4.6 4.4  CL 104 104 102  CO2 27 27 25   GLUCOSE 101* 117* 98  BUN 11 12 19   CREATININE 1.15 1.17 1.16  CALCIUM 9.4 9.2 9.6  GFRNONAA >60 >60 >60  GFRAA >60 >60 >60  PROT 8.0 7.9 8.5*  ALBUMIN 3.9 3.8 4.0  AST 28 29 26   ALT 29 27 23   ALKPHOS 81 109 94  BILITOT 0.5 0.6 0.5   Surgical Pathology  CASE: [id number]  PATIENT: Yaron Cornfield  Surgical Pathology Report  DIAGNOSIS:  A. LUNG, RIGHT UPPER LOBE; WEDGE RESECTION:  - LARGE CELL NEUROENDOCRINE CARCINOMA.  - SEE CANCER SUMMARY BELOW.  - CHANGE CONSISTENT WITH PRIOR FNA.  - REACTIVE SUBPLEURAL SMOOTH MUSCLE HYPERPLASIA.   B. 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  - NEGATIVE FOR MALIGNANCY.   RADIOGRAPHIC STUDIES: I have personally reviewed the radiological images as listed and agreed with the findings in the report.  Dg Chest 2 View  Result Date: 01/28/2019 CLINICAL DATA:  Worsening cough, history of lung carcinoma EXAM: CHEST - 2 VIEW COMPARISON:  PET-CT from 10/05/2018 FINDINGS: Cardiac shadow is within normal limits. The left lung is well aerated without focal abnormality. Postsurgical changes are noted on the right similar to that seen on prior PET-CT although the bulky soft tissue density seen along the suture line has regressed in the interval from the prior exam. No sizable effusion is noted. No acute infiltrate is seen. IMPRESSION: Chronic changes on the right with decrease of bulky soft tissue along the suture line when compared with the prior study. Electronically Signed   By: Inez Catalina M.D.   On: 01/28/2019 13:22   Ct Chest W Contrast  Result Date: 03/11/2019 CLINICAL DATA:  Large-cell neuroendocrine carcinoma of the lung. Follow-up exam. EXAM: CT CHEST WITH CONTRAST TECHNIQUE: Multidetector CT imaging of the chest was performed during intravenous contrast administration. CONTRAST:  32mL OMNIPAQUE IOHEXOL 300 MG/ML  SOLN COMPARISON:  09/28/2018 FINDINGS: Cardiovascular: Normal heart size. No pericardial effusion. Aortic atherosclerosis. RCA coronary artery calcifications noted. Mediastinum/Nodes: Normal appearance of the thyroid gland. The trachea appears patent and is midline. Normal appearance of the esophagus. No axillary or supraclavicular adenopathy. No mediastinal adenopathy. The index left hilar lymph node measures 1.1 cm, image 91/2. Unchanged. Index right hilar lymph node measures 1 cm, image 73/2. Unchanged. New right infrahilar lymph nodes are identified, image 75/2. These measure up to 1.2 cm, image 74/2. Lungs/Pleura: Advanced changes of centrilobular and  paraseptal emphysema. Extensive right midlung fibrosis within a geographic distribution compatible with changes secondary to external beam radiation. Index pleural nodularity along the suture line measures 1.1 cm, image 68/3. Previously 1.8 cm. Index nodule along the suture line measures 1.0 by 1.0 cm, image 77/3. Previously 1.8 x 1.3 cm. New subpleural nodularity overlying the posterior right lower is identified. Index pleural base nodule measures 1.2 x 1.0 cm, image 95/2. Adjacent nodule measures 1.2 by 0.8 cm, image 94/2. Upper Abdomen: No acute abnormality. Musculoskeletal: Mild curvature of the thoracic spine. No aggressive lytic or sclerotic bone lesions. IMPRESSION: 1. There is been interval development of extensive fibrotic change within the right midlung reflecting changes due to external beam radiation. The previously referenced nodule associated with suture line in the right lung have decreased in size in the interval reflecting response to therapy. 2. There are 2 new subpleural nodules overlying the posterior right lower lobe which are nonspecific. Findings concerning for pleural spread of disease. Consider follow-up exam in 3-6 months to assess for interval change in the appearance of these nodules. 3. New right infrahilar lymph node measures 1.2 cm. 4. Aortic Atherosclerosis (ICD10-I70.0) and Emphysema (ICD10-J43.9). Electronically Signed   By: Kerby Moors M.D.   On: 03/11/2019 14:13   Mr Jeri Cos JJ Contrast  Result Date: 04/03/2019 CLINICAL DATA:  Large-cell neuroendocrine carcinoma of the lung. Staging. EXAM: MRI HEAD WITHOUT AND WITH CONTRAST TECHNIQUE: Multiplanar, multiecho pulse sequences of the brain and surrounding structures were obtained without and with intravenous contrast. CONTRAST:  6 cc Gadavist intravenous COMPARISON:  10/14/2018 FINDINGS: Brain: No indication of metastatic disease. No infarction, hemorrhage, hydrocephalus, extra-axial collection or mass lesion. Remote lacunar  infarct in the right thalamus. Minor chronic microvascular ischemic change in the cerebral white matter. Vascular: Major flow voids and vascular enhancements are preserved Skull and upper cervical spine: Negative for marrow lesion Sinuses/Orbits: Negative period Other: Probable retention cyst in the left oropharynx. No abnormal activity in this area on recent PET. IMPRESSION: Negative for intracranial metastasis. Electronically  Signed   By: Monte Fantasia M.D.   On: 04/03/2019 19:47   Nm Pet Image Restag (ps) Skull Base To Thigh  Result Date: 03/22/2019 CLINICAL DATA:  Subsequent treatment strategy for history of large cell neuroendocrine carcinoma. Status post radiation therapy. New pleural nodules and borderline right hilar adenopathy. EXAM: NUCLEAR MEDICINE PET SKULL BASE TO THIGH TECHNIQUE: 8.1 mCi F-18 FDG was injected intravenously. Full-ring PET imaging was performed from the skull base to thigh after the radiotracer. CT data was obtained and used for attenuation correction and anatomic localization. Fasting blood glucose: 105 mg/dl COMPARISON:  Most recent PET 10/05/2018.  Chest CT 03/11/2019. FINDINGS: Mediastinal blood pool activity: SUV max 2.4 Liver activity: SUV max NA NECK: No areas of abnormal hypermetabolism. Incidental CT findings: Bilateral carotid atherosclerosis. No cervical adenopathy. CHEST: Heterogeneous hypermetabolism throughout the right upper lobe, corresponding to areas of interstitial thickening and patchy consolidation. The nodule along the suture line within the right upper lobe measures 9 mm and a S.U.V. max of 3.6 on image 99/3. Compare 1.9 cm and a S.U.V. max of 6.8 on the prior PET. No thoracic nodal hypermetabolism. Right lower lobe pleural-based nodularity corresponds to hypermetabolism. This is at the site of 2 adjacent nodules on the 03/11/2019 exam, but appears more confluent today. Measures 2.9 x 1.3 cm and a S.U.V. max of 5.8 on image 113/3. Incidental CT findings:  Deferred to recent diagnostic CT. Centrilobular emphysema. Aortic and coronary artery atherosclerosis. Right pleural thickening. ABDOMEN/PELVIS: No abdominopelvic parenchymal or nodal hypermetabolism. Incidental CT findings: Normal adrenal glands. Mild renal cortical thinning bilaterally. Abdominal aortic atherosclerosis. Mild hepatic steatosis. Mild prostatomegaly. Small bilateral hydroceles. SKELETON: No abnormal marrow activity. Incidental CT findings: 6 lateral right rib nonacute fracture. IMPRESSION: 1. Hypermetabolism corresponding to the area of right lower lobe pleural-based nodularity. This is most consistent with metastatic disease. 2. Decrease in size and hypermetabolism of nodule along the right upper lobe surgical sutures when compared to 10/05/2018 PET. 3. Patchy hypermetabolism throughout the right upper lobe, corresponding to areas of interstitial thickening and consolidation. Although this could all be radiation induced, concurrent lymphangitic tumor spread cannot be excluded. 4. No evidence of extrathoracic metastatic disease. 5. Hepatic steatosis. 6. Aortic atherosclerosis (ICD10-I70.0), coronary artery atherosclerosis and emphysema (ICD10-J43.9). Electronically Signed   By: Abigail Miyamoto M.D.   On: 03/22/2019 13:36    ASSESSMENT & PLAN:  1. Large cell neuroendocrine carcinoma ([place])   2. Goals of care, counseling/discussion   3. Pleural nodule    #Large cell neuroendocrine carcinoma with recurrence disease.  PET scan and MRI brain were independently reviewed and discussed with patient. Negative for CNS metastasis. Hypermetabolic tumor corresponding to the area of right lower lobe pleural-based nodularity.  Most consistent with metastatic disease.- Decreased size and metabolic activity of right upper lobe nodule. Patchy hypermetabolic seen throughout the right upper lobe corresponding to interstitial thickening and consolidation.  Questionable radiation changes versus lymphangitic tumor  spread.  I had a lengthy discussion with patient today. He adamantly refused chemotherapy or immunotherapy. Goal of care was discussed.  Recommend patient to establish care with palliative care. Patient wants to have repeat imaging 3 months and reconsider his position.  Return of visit: 3 months We spent sufficient time to discuss many aspect of care, questions were answered to patient's satisfaction. Total face to face encounter time for this patient visit was 25 min. >50% of the time was  spent in counseling and coordination of care.     Earlie Server, MD,  PhD

## 2019-05-13 ENCOUNTER — Encounter: Payer: Self-pay | Admitting: *Deleted

## 2019-05-17 IMAGING — CT NM PET TUM IMG RESTAG (PS) SKULL BASE T - THIGH
1 of 10 series · 1 of 25 positions shown · non-contrast
Comparison: PET-CT 01/20/2018 and chest CT 09/28/2018

CLINICAL DATA: Subsequent treatment strategy for lung cancer
(large-cell neuroendocrine tumor). Suspected recurrent tumor on
recent chest CT.

EXAM:
NUCLEAR MEDICINE PET SKULL BASE TO THIGH
TECHNIQUE: 8.28 mCi F-18 FDG was injected intravenously. Full-ring PET imaging
was performed from the skull base to thigh after the radiotracer. CT
data was obtained and used for attenuation correction and anatomic
localization.
Fasting blood glucose: 108 mg/dl

[Series 4: pet wb (ac) · axial · 5.0mm · 3.13mm/px · 1 of 329 slices shown]
[im 165/329]
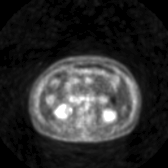

[1 of 25 positions shown; findings below may reference images not displayed]

FINDINGS: Mediastinal blood pool activity: SUV max

NECK: No hypermetabolic lymph nodes in the neck.

Incidental CT findings: none

CHEST: The anterior nodularity along the surgical resection site in
the right upper lobe is hypermetabolic with SUV max of 6.79 and
consistent with recurrent tumor. More posteriorly there's also
nodularity which has an SUV max of 4.27. Multiple adjacent small
pulmonary nodules along the bronchovascular bundles could be
alveolar spread of tumor. This area is mildly diffusely
hypermetabolic with SUV max of 4.22.

7.5 mm subpleural nodule posteriorly in the right upper lobe on
image number 89 is not hypermetabolic.

No enlarged or hypermetabolic mediastinal or hilar lymph nodes to
suggest metastatic adenopathy.

No supraclavicular or axillary lymphadenopathy.

Incidental CT findings: Stable severe emphysematous changes and
pulmonary scarring.

ABDOMEN/PELVIS: No abnormal hypermetabolic activity within the
liver, pancreas, adrenal glands, or spleen. No hypermetabolic lymph
nodes in the abdomen or pelvis.

Incidental CT findings: none

SKELETON: No focal hypermetabolic activity to suggest skeletal
metastasis.

Incidental CT findings: none
IMPRESSION: 1. PET-CT findings consistent with recurrent tumor along the
surgical resection bed in the right upper lobe as detailed above.
There's also nodularity and interstitial thickening more posteriorly
and inferiorly which is likely interstitial or endobronchial spread
of tumor.
2. No enlarged or hypermetabolic mediastinal or hilar lymph nodes.
3. No findings for abdominal/pelvic metastatic disease.

## 2019-05-30 ENCOUNTER — Telehealth: Payer: Self-pay | Admitting: *Deleted

## 2019-05-30 ENCOUNTER — Other Ambulatory Visit: Payer: Self-pay | Admitting: Hospice and Palliative Medicine

## 2019-05-30 ENCOUNTER — Encounter: Payer: Medicare Other | Admitting: Hospice and Palliative Medicine

## 2019-05-30 MED ORDER — OXYCODONE HCL 5 MG PO TABS: 5.0000 mg | ORAL_TABLET | Freq: Four times a day (QID) | ORAL | 0 refills | Status: DC | PRN

## 2019-05-30 NOTE — Telephone Encounter (Signed)
Joseph Barron, we can start pain medication. He may have cancer progression, and needs to decide what he wants to do, treatment vs hospice.

## 2019-05-30 NOTE — Telephone Encounter (Signed)
Daughter Lattie Haw called reporting that patient is in a lot of pain and cannot sleep even with the Trazodone and would like something done for the patient. Please call [phone]

## 2019-05-30 NOTE — Telephone Encounter (Signed)
Please schedule him to see me and Vonna Kotyk.  Thanks.

## 2019-05-30 NOTE — Progress Notes (Signed)
Will start oxycodone 5mg  Q6H PRN for uncontrolled pain. Plan to see patient back in clinic later on Wednesday of this week for re-eval. Case discussed with Dr. Tasia Catchings.

## 2019-05-31 NOTE — Telephone Encounter (Signed)
Lattie Haw informed that prescription was sent to pharmacy for patient

## 2019-06-01 ENCOUNTER — Other Ambulatory Visit: Payer: Self-pay

## 2019-06-01 ENCOUNTER — Inpatient Hospital Stay (HOSPITAL_BASED_OUTPATIENT_CLINIC_OR_DEPARTMENT_OTHER): Payer: Medicare Other | Admitting: Hospice and Palliative Medicine

## 2019-06-01 ENCOUNTER — Ambulatory Visit
Admission: RE | Admit: 2019-06-01 | Discharge: 2019-06-01 | Disposition: A | Discharge status: Home / Self Care | Payer: Medicare Other | Source: Ambulatory Visit | Attending: Hospice and Palliative Medicine | Admitting: Hospice and Palliative Medicine

## 2019-06-01 ENCOUNTER — Telehealth: Payer: Self-pay | Admitting: Hospice and Palliative Medicine

## 2019-06-01 ENCOUNTER — Encounter: Payer: Self-pay | Admitting: Oncology

## 2019-06-01 ENCOUNTER — Inpatient Hospital Stay: Payer: Medicare Other | Attending: Oncology | Admitting: Oncology

## 2019-06-01 VITALS — BP 116/74 | HR 96 | Temp 97.8°F | Resp 18 | Ht 69.0 in | Wt 151.9 lb

## 2019-06-01 DIAGNOSIS — G893 Neoplasm related pain (acute) (chronic): Secondary | ICD-10-CM | POA: Diagnosis not present

## 2019-06-01 DIAGNOSIS — Z87891 Personal history of nicotine dependence: Secondary | ICD-10-CM | POA: Insufficient documentation

## 2019-06-01 DIAGNOSIS — Z7189 Other specified counseling: Secondary | ICD-10-CM | POA: Diagnosis not present

## 2019-06-01 DIAGNOSIS — I700 Atherosclerosis of aorta: Secondary | ICD-10-CM | POA: Insufficient documentation

## 2019-06-01 DIAGNOSIS — Z8249 Family history of ischemic heart disease and other diseases of the circulatory system: Secondary | ICD-10-CM | POA: Diagnosis not present

## 2019-06-01 DIAGNOSIS — E785 Hyperlipidemia, unspecified: Secondary | ICD-10-CM | POA: Diagnosis not present

## 2019-06-01 DIAGNOSIS — I10 Essential (primary) hypertension: Secondary | ICD-10-CM | POA: Insufficient documentation

## 2019-06-01 DIAGNOSIS — C7A1 Malignant poorly differentiated neuroendocrine tumors: Secondary | ICD-10-CM | POA: Insufficient documentation

## 2019-06-01 DIAGNOSIS — R05 Cough: Secondary | ICD-10-CM | POA: Insufficient documentation

## 2019-06-01 DIAGNOSIS — Z79899 Other long term (current) drug therapy: Secondary | ICD-10-CM | POA: Diagnosis not present

## 2019-06-01 DIAGNOSIS — Z515 Encounter for palliative care: Secondary | ICD-10-CM

## 2019-06-01 DIAGNOSIS — C7A8 Other malignant neuroendocrine tumors: Secondary | ICD-10-CM | POA: Diagnosis present

## 2019-06-01 DIAGNOSIS — I2510 Atherosclerotic heart disease of native coronary artery without angina pectoris: Secondary | ICD-10-CM | POA: Insufficient documentation

## 2019-06-01 DIAGNOSIS — M25512 Pain in left shoulder: Secondary | ICD-10-CM | POA: Diagnosis not present

## 2019-06-01 DIAGNOSIS — Z66 Do not resuscitate: Secondary | ICD-10-CM | POA: Diagnosis not present

## 2019-06-01 DIAGNOSIS — R0602 Shortness of breath: Secondary | ICD-10-CM | POA: Insufficient documentation

## 2019-06-01 DIAGNOSIS — K219 Gastro-esophageal reflux disease without esophagitis: Secondary | ICD-10-CM | POA: Diagnosis not present

## 2019-06-01 NOTE — Progress Notes (Signed)
Hematology/Oncology Follow up note [facility] [phone] [phone]   Patient Care Team: Dion Body, MD as PCP - General (Family Medicine) Telford Nab, RN as Registered Nurse  REFERRING PROVIDER: Dion Body, MD  Lung [place] VISIT Follow up for lung adenocarcinoma.  HISTORY OF PRESENTING ILLNESS:  Joseph Barron is a  68 y.o.  male with PMH listed below who was referred to me for evaluation of lung mass.  Patient had CT chest lung cancer screening done on January 12, 2018 which showed there is a spiculated nodule with a volume derived mean diameter of 15.2 mm, which abuts the pleural surface, highly concerning for primary bronchogenic neoplasm.  He is former smoker, quit recently. 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  Reports mild shortness of breath with climbing stairs otherwise feeling well.  He has a chronic cough productive with clear sputum.  Denies any weight loss, headache, abdominal pain, lower extremity swelling.  Today he is accompanied by his daughter to clinic.  Patient's case was discussed on tumor conference on January 14, 2018.  Given his heavy smoking history/COPD/emphysema, risks excised for biopsy. Consensus decision was to obtain a PET scan, pulmonary lung function testing.  If his lung mass is hypermetabolic on PET scan and no other involvement, he will have surgical evaluation regarding possibilities of resection as well as obtaining diagnosis.  # 01/20/2018 PET scan showed low level hypermetabolism of right upper lobe lung nodule.  # 02/23/2018 patient is s/p right thoracotomy and wedge resection. Post surgical course complicated with prolong air leak/pnemothorax.  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   #  09/28/2018. CT chest abdomen pelvis  showed local recurrence with progressive multinodular thickening in the right upper lobe along the minor fissure and to a lesser extent along the upper margin of major fissure.  # 10/05/2018 PET showed findings consistent with recurrent tumor along the surgical resection bed in the right upper lobe as detailed above. There's also nodularity and interstitial thickening more posteriorly and inferiorly which is likely interstitial or endobronchial spread of tumor. MRI brain 10/14/2018 negative for metastatic disease.  # Patient declined chemotherapy.s/p IMRT to recurrent disease and finish 01/13/2019  INTERVAL HISTORY Joseph Barron is a 68 y.o. male who has above history reviewed by me today for evaluation of pain.   Daughter had called and reported that patient has increased pain. Daughter was called during the clinical encounter. Patient reports that the left shoulder worsening pain starting few weeks ago, progressively getting worse.    range of motion is limited due to the pain.   Denies any fever, chills, abdominal pain. He has a chronic cough and shortness of breath, shortness of breath exacerbated with minimal exertion. Appetite is okay.  Weight has been stable.    Review of Systems  Constitutional: Positive for fatigue. Negative for appetite change, chills, fever and unexpected weight change.  HENT:   Negative for hearing loss and voice change.   Eyes: Negative for eye problems and icterus.  Respiratory: Positive for cough and shortness of breath. Negative for chest tightness.   Cardiovascular: Negative for chest pain and leg swelling.  Gastrointestinal: Negative for abdominal distention and abdominal pain.  Endocrine: Negative for hot flashes.  Genitourinary: Negative for difficulty urinating, dysuria and frequency.   Musculoskeletal: Negative for arthralgias.  Left shoulder pain.   Skin: Negative for itching and rash.  Neurological: Negative for light-headedness and numbness.   Hematological: Negative for adenopathy. Does not bruise/bleed easily.  Psychiatric/Behavioral: Negative for confusion.     MEDICAL HISTORY:  Past Medical History:  Diagnosis Date   Borderline diabetes mellitus 08/06/2017   GERD (gastroesophageal reflux disease)    Hyperlipemia    Hypertension    Non-small cell lung cancer ([place]) 12/2017   surgical resection 02/23/2018.   Pulmonary nodules 01/13/2018   Vaccine counseling 12/22/2017    SURGICAL HISTORY: Past Surgical History:  Procedure Laterality Date   COLONOSCOPY WITH PROPOFOL N/A 02/22/2018   Procedure: COLONOSCOPY WITH PROPOFOL;  Surgeon: Manya Silvas, MD;  Location: [facility] ENDOSCOPY;  Service: Endoscopy;  Laterality: N/A;   EYE SURGERY Left child   THORACOTOMY/LOBECTOMY Right 02/23/2018   Procedure: THORACOTOMY/ WEDGE RESECTION;  Surgeon: Nestor Lewandowsky, MD;  Location: [facility] ORS;  Service: Thoracic;  Laterality: Right;   VIDEO BRONCHOSCOPY N/A 02/23/2018   Procedure: PREOP BRONCHOSCOPY;  Surgeon: Nestor Lewandowsky, MD;  Location: [facility] ORS;  Service: Thoracic;  Laterality: N/A;    SOCIAL HISTORY: Social History   Socioeconomic History   Marital status: Married    Spouse name: Not on file   Number of children: Not on file   Years of education: Not on file   Highest education level: Not on file  Occupational History   Not on file  Social Needs   Financial resource strain: Not on file   Food insecurity    Worry: Not on file    Inability: Not on file   Transportation needs    Medical: Not on file    Non-medical: Not on file  Tobacco Use   Smoking status: Former Smoker    Packs/day: 1.00    Years: 50.00    Pack years: 50.00    Types: Cigarettes    Quit date: 11/17/2017    Years since quitting: 1.5   Smokeless tobacco: Former Systems developer    Types: Chew    Quit date: 01/15/1989  Substance and Sexual Activity   Alcohol use: Yes    Comment: very seldom now   Drug use: Yes    Types: Marijuana     Comment: marijuana at night to help sleep at time   Sexual activity: Yes  Lifestyle   Physical activity    Days per week: Not on file    Minutes per session: Not on file   Stress: Not on file  Relationships   Social connections    Talks on phone: Not on file    Gets together: Not on file    Attends religious service: Not on file    Active member of club or organization: Not on file    Attends meetings of clubs or organizations: Not on file    Relationship status: Not on file   Intimate partner violence    Fear of current or ex partner: Not on file    Emotionally abused: Not on file    Physically abused: Not on file    Forced sexual activity: Not on file  Other Topics Concern   Not on file  Social History Narrative   Not on file    FAMILY HISTORY: Family History  Problem Relation Age of Onset   Arthritis Mother    COPD Father    Coronary artery disease Father    Transient ischemic attack Father    Alcoholism Father    Bowel Disease Father  COPD Sister    Prostatitis Brother    Thyroid disease Sister    Thyroid disease Sister    Liver cancer Brother    Diabetes Brother     ALLERGIES:  has No Known Allergies.  MEDICATIONS:  Current Outpatient Medications  Medication Sig Dispense Refill   atorvastatin (LIPITOR) 10 MG tablet Take 10 mg by mouth at bedtime.      lisinopril (PRINIVIL,ZESTRIL) 20 MG tablet Take 20 mg by mouth daily.     omeprazole (PRILOSEC) 40 MG capsule Take by mouth daily.  11   oxyCODONE (OXY IR/ROXICODONE) 5 MG immediate release tablet Take 1 tablet (5 mg total) by mouth every 6 (six) hours as needed for severe pain. 30 tablet 0   No current facility-administered medications for this visit.      PHYSICAL EXAMINATION: ECOG PERFORMANCE STATUS: 1 - Symptomatic but completely ambulatory Vitals:   06/01/19 0911  BP: 116/74  Pulse: 96  Resp: 18  Temp: 97.8 F (36.6 C)   Filed Weights   06/01/19 0911  Weight: 151 lb  14.4 oz (68.9 kg)    Physical Exam  Constitutional: He is oriented to person, place, and time. No distress.  HENT:  Head: Normocephalic and atraumatic.  Nose: Nose normal.  Mouth/Throat: Oropharynx is clear and moist. No oropharyngeal exudate.  Eyes: Pupils are equal, round, and reactive to light. EOM are normal. Right eye exhibits no discharge. Left eye exhibits no discharge. No scleral icterus.  Neck: Normal range of motion. Neck supple. No JVD present.  Cardiovascular: Normal rate, regular rhythm and normal heart sounds.  No murmur heard. Pulmonary/Chest: Effort normal. No respiratory distress. He has no wheezes. He has no rales. He exhibits no tenderness.  Decreased breath sound bilaterally.   Abdominal: Soft. Bowel sounds are normal. He exhibits no distension. There is no abdominal tenderness.  Musculoskeletal: Normal range of motion.        General: No tenderness or edema.     Comments: Left upper extremity/shoulder range of motion limited due to pain.  No erythema or swelling.  Lymphadenopathy:    He has no cervical adenopathy.  Neurological: He is alert and oriented to person, place, and time. No cranial nerve deficit. He exhibits normal muscle tone. Coordination normal.  Skin: Skin is warm and dry. No rash noted. He is not diaphoretic. No erythema.  Psychiatric: Affect normal.     LABORATORY DATA:  I have reviewed the data as listed Lab Results  Component Value Date   WBC 9.6 04/27/2019   HGB 13.6 04/27/2019   HCT 40.3 04/27/2019   MCV 92.4 04/27/2019   PLT 315 04/27/2019   Recent Labs    12/24/18 0923 03/14/19 1333 04/27/19 1002  NA 137 138 137  K 4.6 4.6 4.4  CL 104 104 102  CO2 27 27 25   GLUCOSE 101* 117* 98  BUN 11 12 19   CREATININE 1.15 1.17 1.16  CALCIUM 9.4 9.2 9.6  GFRNONAA >60 >60 >60  GFRAA >60 >60 >60  PROT 8.0 7.9 8.5*  ALBUMIN 3.9 3.8 4.0  AST 28 29 26   ALT 29 27 23   ALKPHOS 81 109 94  BILITOT 0.5 0.6 0.5   Surgical Pathology  CASE:  [id number]  PATIENT: Bereket Stauch  Surgical Pathology Report  DIAGNOSIS:  A. LUNG, RIGHT UPPER LOBE; WEDGE RESECTION:  - LARGE CELL NEUROENDOCRINE CARCINOMA.  - SEE CANCER SUMMARY BELOW.  - CHANGE CONSISTENT WITH PRIOR FNA.  - REACTIVE SUBPLEURAL SMOOTH MUSCLE HYPERPLASIA.  B. PLEURA, RIGHT; BIOPSY:  - HYALINE PLEURAL PLAQUE WITH FOCAL REACTIVE MESOTHELIAL HYPERPLASIA.  - NEGATIVE FOR MALIGNANCY.   RADIOGRAPHIC STUDIES: I have personally reviewed the radiological images as listed and agreed with the findings in the report.  Ct Chest W Contrast  Result Date: 03/11/2019 CLINICAL DATA:  Large-cell neuroendocrine carcinoma of the lung. Follow-up exam. EXAM: CT CHEST WITH CONTRAST TECHNIQUE: Multidetector CT imaging of the chest was performed during intravenous contrast administration. CONTRAST:  13mL OMNIPAQUE IOHEXOL 300 MG/ML  SOLN COMPARISON:  09/28/2018 FINDINGS: Cardiovascular: Normal heart size. No pericardial effusion. Aortic atherosclerosis. RCA coronary artery calcifications noted. Mediastinum/Nodes: Normal appearance of the thyroid gland. The trachea appears patent and is midline. Normal appearance of the esophagus. No axillary or supraclavicular adenopathy. No mediastinal adenopathy. The index left hilar lymph node measures 1.1 cm, image 91/2. Unchanged. Index right hilar lymph node measures 1 cm, image 73/2. Unchanged. New right infrahilar lymph nodes are identified, image 75/2. These measure up to 1.2 cm, image 74/2. Lungs/Pleura: Advanced changes of centrilobular and paraseptal emphysema. Extensive right midlung fibrosis within a geographic distribution compatible with changes secondary to external beam radiation. Index pleural nodularity along the suture line measures 1.1 cm, image 68/3. Previously 1.8 cm. Index nodule along the suture line measures 1.0 by 1.0 cm, image 77/3. Previously 1.8 x 1.3 cm. New subpleural nodularity overlying the posterior right lower is identified.  Index pleural base nodule measures 1.2 x 1.0 cm, image 95/2. Adjacent nodule measures 1.2 by 0.8 cm, image 94/2. Upper Abdomen: No acute abnormality. Musculoskeletal: Mild curvature of the thoracic spine. No aggressive lytic or sclerotic bone lesions. IMPRESSION: 1. There is been interval development of extensive fibrotic change within the right midlung reflecting changes due to external beam radiation. The previously referenced nodule associated with suture line in the right lung have decreased in size in the interval reflecting response to therapy. 2. There are 2 new subpleural nodules overlying the posterior right lower lobe which are nonspecific. Findings concerning for pleural spread of disease. Consider follow-up exam in 3-6 months to assess for interval change in the appearance of these nodules. 3. New right infrahilar lymph node measures 1.2 cm. 4. Aortic Atherosclerosis (ICD10-I70.0) and Emphysema (ICD10-J43.9). Electronically Signed   By: Kerby Moors M.D.   On: 03/11/2019 14:13   Mr Jeri Cos EP Contrast  Result Date: 04/03/2019 CLINICAL DATA:  Large-cell neuroendocrine carcinoma of the lung. Staging. EXAM: MRI HEAD WITHOUT AND WITH CONTRAST TECHNIQUE: Multiplanar, multiecho pulse sequences of the brain and surrounding structures were obtained without and with intravenous contrast. CONTRAST:  6 cc Gadavist intravenous COMPARISON:  10/14/2018 FINDINGS: Brain: No indication of metastatic disease. No infarction, hemorrhage, hydrocephalus, extra-axial collection or mass lesion. Remote lacunar infarct in the right thalamus. Minor chronic microvascular ischemic change in the cerebral white matter. Vascular: Major flow voids and vascular enhancements are preserved Skull and upper cervical spine: Negative for marrow lesion Sinuses/Orbits: Negative period Other: Probable retention cyst in the left oropharynx. No abnormal activity in this area on recent PET. IMPRESSION: Negative for intracranial metastasis.  Electronically Signed   By: Monte Fantasia M.D.   On: 04/03/2019 19:47   Nm Pet Image Restag (ps) Skull Base To Thigh  Result Date: 03/22/2019 CLINICAL DATA:  Subsequent treatment strategy for history of large cell neuroendocrine carcinoma. Status post radiation therapy. New pleural nodules and borderline right hilar adenopathy. EXAM: NUCLEAR MEDICINE PET SKULL BASE TO THIGH TECHNIQUE: 8.1 mCi F-18 FDG was injected intravenously. Full-ring PET imaging was  performed from the skull base to thigh after the radiotracer. CT data was obtained and used for attenuation correction and anatomic localization. Fasting blood glucose: 105 mg/dl COMPARISON:  Most recent PET 10/05/2018.  Chest CT 03/11/2019. FINDINGS: Mediastinal blood pool activity: SUV max 2.4 Liver activity: SUV max NA NECK: No areas of abnormal hypermetabolism. Incidental CT findings: Bilateral carotid atherosclerosis. No cervical adenopathy. CHEST: Heterogeneous hypermetabolism throughout the right upper lobe, corresponding to areas of interstitial thickening and patchy consolidation. The nodule along the suture line within the right upper lobe measures 9 mm and a S.U.V. max of 3.6 on image 99/3. Compare 1.9 cm and a S.U.V. max of 6.8 on the prior PET. No thoracic nodal hypermetabolism. Right lower lobe pleural-based nodularity corresponds to hypermetabolism. This is at the site of 2 adjacent nodules on the 03/11/2019 exam, but appears more confluent today. Measures 2.9 x 1.3 cm and a S.U.V. max of 5.8 on image 113/3. Incidental CT findings: Deferred to recent diagnostic CT. Centrilobular emphysema. Aortic and coronary artery atherosclerosis. Right pleural thickening. ABDOMEN/PELVIS: No abdominopelvic parenchymal or nodal hypermetabolism. Incidental CT findings: Normal adrenal glands. Mild renal cortical thinning bilaterally. Abdominal aortic atherosclerosis. Mild hepatic steatosis. Mild prostatomegaly. Small bilateral hydroceles. SKELETON: No abnormal  marrow activity. Incidental CT findings: 6 lateral right rib nonacute fracture. IMPRESSION: 1. Hypermetabolism corresponding to the area of right lower lobe pleural-based nodularity. This is most consistent with metastatic disease. 2. Decrease in size and hypermetabolism of nodule along the right upper lobe surgical sutures when compared to 10/05/2018 PET. 3. Patchy hypermetabolism throughout the right upper lobe, corresponding to areas of interstitial thickening and consolidation. Although this could all be radiation induced, concurrent lymphangitic tumor spread cannot be excluded. 4. No evidence of extrathoracic metastatic disease. 5. Hepatic steatosis. 6. Aortic atherosclerosis (ICD10-I70.0), coronary artery atherosclerosis and emphysema (ICD10-J43.9). Electronically Signed   By: Abigail Miyamoto M.D.   On: 03/22/2019 13:36   Dg Shoulder Left  Result Date: 06/01/2019 CLINICAL DATA:  68 year old male with shoulder pain, history of large cell neuroendocrine carcinoma of the right lung. EXAM: LEFT SHOULDER - 2+ VIEW COMPARISON:  PET-CT 03/22/2019. FINDINGS: Bone mineralization is within normal limits for age. Superior subluxation of the left humeral head is associated with left glenohumeral joint space loss, glenoid and humeral head degenerative spurring. The proximal left humerus appears intact. Left clavicle and scapula appear intact. Negative visible left ribs and lung parenchyma. No acute osseous abnormality identified. IMPRESSION: Advanced left glenohumeral joint degeneration. No acute osseous abnormality identified. Electronically Signed   By: Genevie Ann M.D.   On: 06/01/2019 15:32    ASSESSMENT & PLAN:  1. Large cell neuroendocrine carcinoma ([place])   2. Goals of care, counseling/discussion   3. Acute pain of left shoulder    #Left shoulder pain, possible acute inflammation versus metastasis.  Obtain left shoulder x-ray.  #Large cell neuroendocrine carcinoma with recurrence disease.  Patient has declined  chemotherapy.  Original plan was to proceed with repeat scan at the end of August and rediscuss. Today I had a lengthy discussion with patient and his daughter Lattie Haw.  Goal of care was discussed.  Patient adamantly declines palliative chemotherapy.  He is not interested in any treatment at this point. He has decided to forego future image surveillance and treatment.  He would like to prophylax on comfort and quality of life.  He will see palliative care service today for further discussion.  #Neoplasm related pain, he has been switched to oxycodone 5 mg every 4-6  hours as needed.  He will continue to follow-up with palliative care for symptom control.  CODE STATUS, DNR/DNI.   Continue supportive care. Follow-up with palliative care  We spent sufficient time to discuss many aspect of care, questions were answered to patient's satisfaction. Total face to face encounter time for this patient visit was 25 min. >50% of the time was  spent in counseling and coordination of care.     Earlie Server, MD, PhD

## 2019-06-01 NOTE — Progress Notes (Signed)
[place]  [phone] [phone]   Name: Joseph Barron Date: 06/01/2019 MRN: [id number]  DOB: October 13, 1951  Patient Care Team: Dion Body, MD as PCP - General (Family Medicine) Telford Nab, RN as Registered Nurse    REASON FOR CONSULTATION: Palliative Care consult requested for this 68 y.o. 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  PET scan on 03/22/2019 revealed a hypermetabolic right lower lobe nodularity consistent with metastatic disease and patchy hypermetabolism throughout the right upper lobe, which was unclear if it was related to radiation changes or lymphangitic tumor spread.  Systemic chemotherapy was recommended but patient declined.  He was referred to palliative care to help address goals and manage ongoing symptoms.   SOCIAL HISTORY:     reports that he quit smoking about 18 months ago. His smoking use included cigarettes. He has a 50.00 pack-year smoking history. He quit smokeless tobacco use about 30 years ago.  His smokeless tobacco use included chew. He reports current alcohol use. He reports current drug use. Drug: Marijuana.  Patient is divorced.  He currently lives at home with his daughter and son-in-law.  He has a stepson whom he raised.  ADVANCE DIRECTIVES:  Does not have  CODE STATUS: DNR/Comfort care (MOST form completed on 06/01/19)  PAST MEDICAL HISTORY: Past Medical History:  Diagnosis Date  . 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 GERD (gastroesophageal reflux disease)   . Hyperlipemia   . Hypertension   . Non-small cell lung cancer ([place]) 12/2017   surgical resection 02/23/2018.  . Pulmonary nodules 01/13/2018  . Vaccine counseling 12/22/2017    PAST SURGICAL HISTORY:  Past Surgical History:  Procedure Laterality Date  . COLONOSCOPY WITH PROPOFOL N/A 02/22/2018   Procedure: COLONOSCOPY WITH PROPOFOL;  Surgeon: Manya Silvas, MD;  Location: [facility] ENDOSCOPY;  Service: Endoscopy;  Laterality: N/A;  . EYE SURGERY Left child  . THORACOTOMY/LOBECTOMY Right 02/23/2018   Procedure: THORACOTOMY/ WEDGE RESECTION;  Surgeon: Nestor Lewandowsky, MD;  Location: [facility] ORS;  Service: Thoracic;  Laterality: Right;  Marland Kitchen VIDEO BRONCHOSCOPY N/A 02/23/2018   Procedure: PREOP BRONCHOSCOPY;  Surgeon: Nestor Lewandowsky, MD;  Location: [facility] ORS;  Service: Thoracic;  Laterality: N/A;    HEMATOLOGY/ONCOLOGY HISTORY:  Oncology History  Large cell neuroendocrine carcinoma ([place])  02/08/2018 Initial Diagnosis   Primary adenocarcinoma of right lung ([place])   02/08/2018 Cancer Staging   Staging form: Lung, AJCC 8th Edition - Clinical: Stage IA2 (cT1b, cN0, cM0) - Signed by Earlie Server, MD on 02/08/2018   03/26/2018 Cancer Staging   Staging form: Lung, AJCC 8th Edition - Pathologic stage from 03/26/2018: Stage Unknown (pT1b, pNX, cM0) - Signed by Earlie Server, MD on 03/27/2018     ALLERGIES:  has No Known Allergies.  MEDICATIONS:  Current Outpatient Medications  Medication Sig Dispense Refill  . atorvastatin (LIPITOR) 10 MG tablet Take 10 mg by mouth at bedtime.     Marland Kitchen lisinopril (PRINIVIL,ZESTRIL) 20 MG tablet Take 20 mg by mouth daily.    Marland Kitchen omeprazole (PRILOSEC) 40 MG capsule Take by mouth daily.  11  . oxyCODONE (OXY IR/ROXICODONE) 5 MG immediate release tablet Take 1 tablet (5 mg total) by mouth every 6 (six) hours as needed for severe pain. 30 tablet 0   No current facility-administered medications for this visit.     VITAL SIGNS: There were no vitals taken for this visit. There were no  vitals filed for this visit.  Estimated body mass index is 22.43 kg/m as calculated from the following:   Height as of an earlier encounter on 06/01/19: 5' 9"  (1.753 m).   Weight as of an earlier encounter on 06/01/19: 151 lb 14.4 oz (68.9 kg).  LABS: CBC:    Component Value Date/Time   WBC 9.6  04/27/2019 1002   HGB 13.6 04/27/2019 1002   HCT 40.3 04/27/2019 1002   PLT 315 04/27/2019 1002   MCV 92.4 04/27/2019 1002   NEUTROABS 6.2 04/27/2019 1002   LYMPHSABS 2.0 04/27/2019 1002   MONOABS 1.0 04/27/2019 1002   EOSABS 0.3 04/27/2019 1002   BASOSABS 0.1 04/27/2019 1002   Comprehensive Metabolic Panel:    Component Value Date/Time   NA 137 04/27/2019 1002   K 4.4 04/27/2019 1002   CL 102 04/27/2019 1002   CO2 25 04/27/2019 1002   BUN 19 04/27/2019 1002   CREATININE 1.16 04/27/2019 1002   GLUCOSE 98 04/27/2019 1002   CALCIUM 9.6 04/27/2019 1002   AST 26 04/27/2019 1002   ALT 23 04/27/2019 1002   ALKPHOS 94 04/27/2019 1002   BILITOT 0.5 04/27/2019 1002   PROT 8.5 (H) 04/27/2019 1002   ALBUMIN 4.0 04/27/2019 1002    RADIOGRAPHIC STUDIES: No results found.  PERFORMANCE STATUS (ECOG) : 1 - Symptomatic but completely ambulatory  Review of Systems Unless otherwise noted, a complete review of systems is negative.  Physical Exam General: NAD, frail appearing, thin Pulmonary: unlabored Extremities: no edema, no joint deformities Skin: no rashes Neurological: Weakness but otherwise nonfocal  IMPRESSION: I met with patient today following his appointment with Dr. Tasia Catchings.  Patient's daughter, Lattie Haw, participated in the conversation via phone.  Patient says he is decided to forego future treatment of the cancer and instead focus on comfort and quality of life.  He verbalized a clear understanding that he likely will die as result of the cancer and says he just wants to live for as long as he can and be with his family at home.  Patient reports doing reasonably well without any significant changes or decline since last seen.  He is functionally independent and still driving.  He has had some left shoulder pain over the past couple of months associated with movement.  He describes it as a severe ache.  Will obtain shoulder x-ray today.  I have sent a prescription for oxycodone but  he has yet to have it filled.  We discussed hospice involvement and patient would be interested when clinically appropriate.  He does not think that he would have much benefit from hospice at this point given how well he appears to be doing at home.  He is in agreement with palliative care following at home with a plan to transition to hospice when the need arises.  There is not a plan for medical oncology follow-up unless requested by patient.  I do plan to see him periodically via televisit.  Patient has ACP documents at home and has completed those but has not yet notarized them.  We did discuss CODE STATUS today and he would not be interested in resuscitative efforts or having his life prolonged artificially on machines.  He also says he is not interested in hospitalizations and would want to die at home.  He reiterated his goal of comfort and quality of life.   I completed a MOST form today. The patient and family outlined their wishes for the following treatment decisions:  Cardiopulmonary Resuscitation:  Do Not Attempt Resuscitation (DNR/No CPR)  Medical Interventions: Comfort Measures: Keep clean, warm, and dry. Use medication by any route, positioning, wound care, and other measures to relieve pain and suffering. Use oxygen, suction and manual treatment of airway obstruction as needed for comfort. Do not transfer to the hospital unless comfort needs cannot be met in current location.  Antibiotics: Determine use of limitation of antibiotics when infection occurs  IV Fluids: No IV fluids (provide other measures to ensure comfort)  Feeding Tube: No feeding tube    PLAN: -Best supportive care -Oxycodone 5 mg every 6 hours PRN for breakthrough pain -Prophylactic bowel regimen -MOST form completed (DNR/comfort measures) -Left shoulder x-ray ordered -Referral to community-based palliative care -Plan for follow-up televisit in 1 month   Patient expressed understanding and was in agreement  with this plan. He also understands that He can call the clinic at any time with any questions, concerns, or complaints.     Time Total: 45 minutes  Visit consisted of counseling and education dealing with the complex and emotionally intense issues of symptom management and palliative care in the setting of serious and potentially life-threatening illness.Greater than 50%  of this time was spent counseling and coordinating care related to the above assessment and plan.  Signed by: Altha Harm, PhD, NP-C [phone] (Work Cell)

## 2019-06-01 NOTE — Progress Notes (Signed)
Pt states that he was drinking soda and urine yellow, he stopped drinking sodas and only drink water 3 big jugs of it every day-urine color has not changed, he wakes up every am with fingers unable to move until he runs warm water over it and left should to elbow hurts

## 2019-06-01 NOTE — Telephone Encounter (Signed)
I tried calling patient but did not reach him. I called and spoke with patient's daughter, Lattie Haw. I updated her on the results of the XR. Could consider referral to ortho if patient desires. She will talk to patient and let us know.

## 2019-06-02 ENCOUNTER — Telehealth: Payer: Self-pay | Admitting: Student

## 2019-06-02 NOTE — Telephone Encounter (Signed)
Spoke with patient's daughter Lattie Haw and discussed Palliative services and she was in agreement with this.  I have scheduled a Telephone Palliative Consult for 06/03/19 @ 11 AM.

## 2019-06-03 ENCOUNTER — Other Ambulatory Visit: Payer: Self-pay

## 2019-06-03 ENCOUNTER — Other Ambulatory Visit: Payer: Medicare Other | Admitting: Student

## 2019-06-03 DIAGNOSIS — Z515 Encounter for palliative care: Secondary | ICD-10-CM

## 2019-06-03 NOTE — Progress Notes (Signed)
Joseph Barron Consult Note Telephone: [phone]  Fax: [phone]  PATIENT NAME: Joseph Barron DOB: 1951/06/16 MRN: [id number]  PRIMARY CARE PROVIDER:   Dion Body, MD  REFERRING PROVIDER:  Dion Body, MD Joseph Barron Joseph Barron,  Joseph Barron [phone]  RESPONSIBLE PARTY: Daughter, Joseph Barron  ASSESSMENT: Due to the COVID-19 crisis, this visit was done via telemedicine and it was initiated and consent by this patient and or family. Patient was unable to connect via zoom, therefore visit was conducted via telephone. Palliative NP explained role of Palliative Care in the community. We discussed goals of care; Joseph Barron has decided not to pursue any further aggressive treatment. He feels he is doing well now. We briefly discussed how Palliative care can monitor for changes and declines and be a transition to hospice and additional services when he begins to decline and needs additional support; he verbalizes understanding. We discussed symptom management. He states he will not pursue any further workup of his left shoulder at this time; he will continue to use pain medication to help manage pain. He is educated on possible constipation with opioid usage; he verbalizes understanding. Joseph Barron and daughter Joseph Barron are given the opportunity to ask questions.          RECOMMENDATIONS and PLAN:  1. Code status: DNR. Had MOST form completed on 06/01/2019. 2. Goals of therapy: Palliative Care will continue to provide support and monitor for changes and declines; will refer for hospice evaluation when patient has declined further and he is in agreement with transitioning to hospice services.  3. Symptom management: Pain-continue oxycodone every 6 hours PRN.   Palliative will follow up in 4 weeks or sooner, if needed.   I spent 20 minutes providing this consultation,  from 11:00am to 11:20am. More than 50% of the time  in this consultation was spent coordinating communication.   HISTORY OF PRESENT ILLNESS:  Joseph Barron is a 68 y.o. year old male with multiple medical problems including large cell neuroendocrine carcinoma of lung s/p surgical resection 01/2018 and XRT, hypertension, hyperlipidemia, GERD. Palliative Care was asked to help address goals of care. [name] states he is doing well. He reports left shoulder pain x the past 2.5 months, but states the pain medication is helping. He is just taking at night at the time. He denies worsening shortness of breath, states it is no worse since having his surgery last year. Denies nausea or constipation. He endorses a good appetite. He is sleeping well at night. He states he is able to complete all his adl's, still drives. He has paperwork filled out of HCPOA but needs to have notarized. He denies any needs at this time.   CODE STATUS: DNR  PPS: % HOSPICE ELIGIBILITY/DIAGNOSIS: TBD  PAST MEDICAL HISTORY:  Past Medical History:  Diagnosis Date  . 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 GERD (gastroesophageal reflux disease)   . Hyperlipemia   . Hypertension   . Non-small cell lung cancer ([place]) 12/2017   surgical resection 02/23/2018.  . Pulmonary nodules 01/13/2018  . Vaccine counseling 12/22/2017    SOCIAL HX:  Social History   Tobacco Use  . Smoking status: Former Smoker    Packs/day: 1.00    Years: 50.00    Pack years: 50.00    Types: Cigarettes    Quit date: 11/17/2017    Years since quitting: 1.5  . Smokeless tobacco: Former Systems developer    Types:  Sarina Ser    Quit date: 01/15/1989  Substance Use Topics  . Alcohol use: Yes    Comment: very seldom now    ALLERGIES: No Known Allergies   PERTINENT MEDICATIONS:  Outpatient Encounter Medications as of 06/03/2019  Medication Sig  . atorvastatin (LIPITOR) 10 MG tablet Take 10 mg by mouth at bedtime.   Marland Kitchen lisinopril (PRINIVIL,ZESTRIL) 20 MG tablet Take 20 mg by mouth daily.  Marland Kitchen omeprazole (PRILOSEC) 40  MG capsule Take by mouth daily.  Marland Kitchen oxyCODONE (OXY IR/ROXICODONE) 5 MG immediate release tablet Take 1 tablet (5 mg total) by mouth every 6 (six) hours as needed for severe pain.   No facility-administered encounter medications on file as of 06/03/2019.     PHYSICAL EXAM:   Physical exam deferred.    Ezekiel Slocumb, NP

## 2019-06-15 ENCOUNTER — Ambulatory Visit: Payer: Medicare Other | Admitting: Radiation Oncology

## 2019-06-27 ENCOUNTER — Ambulatory Visit: Payer: Medicare Other

## 2019-06-30 ENCOUNTER — Ambulatory Visit: Payer: Medicare Other | Admitting: Oncology

## 2019-06-30 ENCOUNTER — Encounter: Payer: Medicare Other | Admitting: Hospice and Palliative Medicine

## 2019-07-01 ENCOUNTER — Other Ambulatory Visit: Payer: Self-pay

## 2019-07-01 ENCOUNTER — Inpatient Hospital Stay: Payer: Medicare Other | Attending: Hospice and Palliative Medicine | Admitting: Hospice and Palliative Medicine

## 2019-07-01 DIAGNOSIS — C7A8 Other malignant neuroendocrine tumors: Secondary | ICD-10-CM

## 2019-07-01 DIAGNOSIS — Z79899 Other long term (current) drug therapy: Secondary | ICD-10-CM | POA: Insufficient documentation

## 2019-07-01 DIAGNOSIS — Z923 Personal history of irradiation: Secondary | ICD-10-CM | POA: Insufficient documentation

## 2019-07-01 DIAGNOSIS — R042 Hemoptysis: Secondary | ICD-10-CM | POA: Insufficient documentation

## 2019-07-01 DIAGNOSIS — I10 Essential (primary) hypertension: Secondary | ICD-10-CM | POA: Insufficient documentation

## 2019-07-01 DIAGNOSIS — Z515 Encounter for palliative care: Secondary | ICD-10-CM

## 2019-07-01 DIAGNOSIS — E785 Hyperlipidemia, unspecified: Secondary | ICD-10-CM | POA: Insufficient documentation

## 2019-07-01 DIAGNOSIS — K219 Gastro-esophageal reflux disease without esophagitis: Secondary | ICD-10-CM | POA: Insufficient documentation

## 2019-07-01 DIAGNOSIS — C7A1 Malignant poorly differentiated neuroendocrine tumors: Secondary | ICD-10-CM | POA: Insufficient documentation

## 2019-07-01 DIAGNOSIS — E860 Dehydration: Secondary | ICD-10-CM | POA: Insufficient documentation

## 2019-07-01 DIAGNOSIS — R5383 Other fatigue: Secondary | ICD-10-CM | POA: Insufficient documentation

## 2019-07-01 DIAGNOSIS — Z87891 Personal history of nicotine dependence: Secondary | ICD-10-CM | POA: Insufficient documentation

## 2019-07-01 MED ORDER — BENZONATATE 100 MG PO CAPS: 100.0000 mg | ORAL_CAPSULE | Freq: Three times a day (TID) | ORAL | 0 refills | Status: DC | PRN

## 2019-07-01 NOTE — Progress Notes (Signed)
Virtual Visit via Telephone Note  I connected with Joseph Barron on 07/01/19 at  9:30 AM EDT by telephone and verified that I am speaking with the correct person using two identifiers.   I discussed the limitations, risks, security and privacy concerns of performing an evaluation and management service by telephone and the availability of in person appointments. I also discussed with the patient that there may be a patient responsible charge related to this service. The patient expressed understanding and agreed to proceed.   History of Present Illness: Palliative Care consult requested for this 68 y.o. 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  PET scan on 03/22/2019 revealed a hypermetabolic right lower lobe nodularity consistent with metastatic disease and patchy hypermetabolism throughout the right upper lobe, which was unclear if it was related to radiation changes or lymphangitic tumor spread.  Systemic chemotherapy was recommended but patient declined.  He was referred to palliative care to help address goals and manage ongoing symptoms.   Observations/Objective: Patient reports that he is doing reasonably well without significant changes or concerns today.  He does have recent intermittently productive cough and upper airway mucus and feels like he has a cold.  He denies fever or chills.  Denies shortness of breath.  Has occasional wheezing.  Assessment and Plan: 1.  Lung cancer -patient has previously made the decision not to pursue treatment and confirmed that decision today.  Will need future hospice involvement when patient feels he is ready.  Home-based palliative care is following.  2.  Cough -patient reports that he has some recent pulmonary congestion and feels like he has a "cold".  He has some mucus that is having difficulty getting up.  Recommended OTC Mucinex.  He has an albuterol  inhaler in the home for use for occasional wheezing.  Will send prescription for benzonatate.  3.  Pain -stable on current regimen and as needed oxycodone.  Patient says that he is often does not regularly require the oxycodone.  Follow Up Instructions: We will plan follow-up telephone visit in 1 month   I discussed the assessment and treatment plan with the patient. The patient was provided an opportunity to ask questions and all were answered. The patient agreed with the plan and demonstrated an understanding of the instructions.   The patient was advised to call back or seek an in-person evaluation if the symptoms worsen or if the condition fails to improve as anticipated.  I provided 12 minutes of non-face-to-face time during this encounter.   Irean Hong, NP

## 2019-07-05 ENCOUNTER — Other Ambulatory Visit: Payer: Self-pay

## 2019-07-06 ENCOUNTER — Encounter: Payer: Self-pay | Admitting: Radiation Oncology

## 2019-07-06 ENCOUNTER — Ambulatory Visit
Admission: RE | Admit: 2019-07-06 | Discharge: 2019-07-06 | Disposition: A | Discharge status: Home / Self Care | Payer: Medicare Other | Source: Ambulatory Visit | Attending: Radiation Oncology | Admitting: Radiation Oncology

## 2019-07-06 ENCOUNTER — Other Ambulatory Visit: Payer: Self-pay

## 2019-07-06 ENCOUNTER — Other Ambulatory Visit: Payer: Self-pay | Admitting: *Deleted

## 2019-07-06 VITALS — BP 112/65 | HR 130 | Temp 98.2°F | Wt 146.9 lb

## 2019-07-06 DIAGNOSIS — R5383 Other fatigue: Secondary | ICD-10-CM | POA: Diagnosis not present

## 2019-07-06 DIAGNOSIS — Z923 Personal history of irradiation: Secondary | ICD-10-CM | POA: Insufficient documentation

## 2019-07-06 DIAGNOSIS — C3491 Malignant neoplasm of unspecified part of right bronchus or lung: Secondary | ICD-10-CM

## 2019-07-06 DIAGNOSIS — C3411 Malignant neoplasm of upper lobe, right bronchus or lung: Secondary | ICD-10-CM | POA: Diagnosis present

## 2019-07-06 DIAGNOSIS — Z87891 Personal history of nicotine dependence: Secondary | ICD-10-CM | POA: Insufficient documentation

## 2019-07-06 DIAGNOSIS — R918 Other nonspecific abnormal finding of lung field: Secondary | ICD-10-CM | POA: Diagnosis not present

## 2019-07-06 NOTE — Progress Notes (Signed)
Radiation Oncology Follow up Note  Name: Joseph Barron   Date:   07/06/2019 MRN:  [id number] DOB: 12-09-1950    This 68 y.o. 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  REFERRING PROVIDER: Dion Body, MD  HPI: Patient is a 68 year old male now about 6 months having completed adjuvant radiation therapy for local recurrence after wedge resection for large cell neuroendocrine tumor of his right lung.  He is seen today in routine follow-up is doing well feels quite fatigued a lot specifically denies cough hemoptysis or chest tightness..  He had an MRI of scan of his brain back in June which was negative for metastatic disease.  PET CT scan back in May showed hypermetabolic activity in the right lower lobe consistent with metastatic disease.  He had decreased in size and hypermetabolic [place] of the nodule along the right upper lobe surgical sutures.  There is also patchy hypermetabolic activity throughout the right upper lobe corresponding to areas of interstitial thickening and's consolidation which may be related to his radiation treatments.  He specifically denies dysphasia.  COMPLICATIONS OF TREATMENT: none  FOLLOW UP COMPLIANCE: keeps appointments   PHYSICAL EXAM:  BP 112/65   Pulse (!) 130   Temp 98.2 F (36.8 C)   Wt 146 lb 14.4 oz (66.6 kg)   BMI 21.69 kg/m  Well-developed well-nourished patient in NAD. HEENT reveals PERLA, EOMI, discs not visualized.  Oral cavity is clear. No oral mucosal lesions are identified. Neck is clear without evidence of cervical or supraclavicular adenopathy. Lungs are clear to A&P. Cardiac examination is essentially unremarkable with regular rate and rhythm without murmur rub or thrill. Abdomen is benign with no organomegaly or masses noted. Motor sensory and DTR levels are equal and symmetric in  the upper and lower extremities. Cranial nerves II through XII are grossly intact. Proprioception is intact. No peripheral adenopathy or edema is identified. No motor or sensory levels are noted. Crude visual fields are within normal range.  RADIOLOGY RESULTS: PET CT scan ordered new CT scan of his chest ordered in 1 month  PLAN: At this time patient is stable.  Have ordered a CT scan of his chest in 1 month and to reevaluate him shortly thereafter.  He is adamantly declined systemic chemotherapy or immunotherapy by medical oncology.  Should he have new findings on CT scan with progression of disease may refer back to medical oncology for reevaluation of that recommendation.  Patient comprehends my treatment plan well.  CT scan was ordered as well as follow-up appointment shortly thereafter.  Patient knows to call with any concerns.  I would like to take this opportunity to thank you for allowing me to participate in the care of your patient.Noreene Filbert, MD

## 2019-07-06 NOTE — Progress Notes (Signed)
CT

## 2019-07-12 ENCOUNTER — Telehealth: Payer: Self-pay | Admitting: *Deleted

## 2019-07-12 ENCOUNTER — Ambulatory Visit
Admission: RE | Admit: 2019-07-12 | Discharge: 2019-07-12 | Disposition: A | Discharge status: Home / Self Care | Payer: Medicare Other | Source: Ambulatory Visit | Attending: Radiation Oncology | Admitting: Radiation Oncology

## 2019-07-12 ENCOUNTER — Inpatient Hospital Stay: Payer: Medicare Other

## 2019-07-12 ENCOUNTER — Inpatient Hospital Stay (HOSPITAL_BASED_OUTPATIENT_CLINIC_OR_DEPARTMENT_OTHER): Payer: Medicare Other | Admitting: Hospice and Palliative Medicine

## 2019-07-12 ENCOUNTER — Other Ambulatory Visit: Payer: Self-pay

## 2019-07-12 VITALS — BP 90/60 | HR 123 | Temp 98.2°F

## 2019-07-12 DIAGNOSIS — R042 Hemoptysis: Secondary | ICD-10-CM | POA: Diagnosis not present

## 2019-07-12 DIAGNOSIS — Z79899 Other long term (current) drug therapy: Secondary | ICD-10-CM | POA: Diagnosis not present

## 2019-07-12 DIAGNOSIS — R5383 Other fatigue: Secondary | ICD-10-CM | POA: Diagnosis not present

## 2019-07-12 DIAGNOSIS — C7A8 Other malignant neuroendocrine tumors: Secondary | ICD-10-CM

## 2019-07-12 DIAGNOSIS — E860 Dehydration: Secondary | ICD-10-CM

## 2019-07-12 DIAGNOSIS — C3491 Malignant neoplasm of unspecified part of right bronchus or lung: Secondary | ICD-10-CM | POA: Insufficient documentation

## 2019-07-12 DIAGNOSIS — Z923 Personal history of irradiation: Secondary | ICD-10-CM | POA: Diagnosis not present

## 2019-07-12 DIAGNOSIS — Z7189 Other specified counseling: Secondary | ICD-10-CM | POA: Diagnosis not present

## 2019-07-12 DIAGNOSIS — C7A1 Malignant poorly differentiated neuroendocrine tumors: Secondary | ICD-10-CM | POA: Diagnosis not present

## 2019-07-12 DIAGNOSIS — Z87891 Personal history of nicotine dependence: Secondary | ICD-10-CM | POA: Diagnosis not present

## 2019-07-12 DIAGNOSIS — E785 Hyperlipidemia, unspecified: Secondary | ICD-10-CM | POA: Diagnosis not present

## 2019-07-12 DIAGNOSIS — K219 Gastro-esophageal reflux disease without esophagitis: Secondary | ICD-10-CM | POA: Diagnosis not present

## 2019-07-12 DIAGNOSIS — I10 Essential (primary) hypertension: Secondary | ICD-10-CM | POA: Diagnosis not present

## 2019-07-12 LAB — CBC WITH DIFFERENTIAL/PLATELET
Abs Immature Granulocytes: 0.03 10*3/uL (ref 0.00–0.07)
Basophils Absolute: 0.1 10*3/uL (ref 0.0–0.1)
Basophils Relative: 1 %
Eosinophils Absolute: 0.2 10*3/uL (ref 0.0–0.5)
Eosinophils Relative: 3 %
HCT: 36.6 % — ABNORMAL LOW (ref 39.0–52.0)
Hemoglobin: 12 g/dL — ABNORMAL LOW (ref 13.0–17.0)
Immature Granulocytes: 0 %
Lymphocytes Relative: 16 %
Lymphs Abs: 1.5 10*3/uL (ref 0.7–4.0)
MCH: 30.2 pg (ref 26.0–34.0)
MCHC: 32.8 g/dL (ref 30.0–36.0)
MCV: 92.2 fL (ref 80.0–100.0)
Monocytes Absolute: 1 10*3/uL (ref 0.1–1.0)
Monocytes Relative: 11 %
Neutro Abs: 6.3 10*3/uL (ref 1.7–7.7)
Neutrophils Relative %: 69 %
Platelets: 423 10*3/uL — ABNORMAL HIGH (ref 150–400)
RBC: 3.97 MIL/uL — ABNORMAL LOW (ref 4.22–5.81)
RDW: 12.2 % (ref 11.5–15.5)
WBC: 9.1 10*3/uL (ref 4.0–10.5)
nRBC: 0 % (ref 0.0–0.2)

## 2019-07-12 LAB — COMPREHENSIVE METABOLIC PANEL
ALT: 28 U/L (ref 0–44)
AST: 44 U/L — ABNORMAL HIGH (ref 15–41)
Albumin: 3.4 g/dL — ABNORMAL LOW (ref 3.5–5.0)
Alkaline Phosphatase: 108 U/L (ref 38–126)
Anion gap: 11 (ref 5–15)
BUN: 18 mg/dL (ref 8–23)
CO2: 26 mmol/L (ref 22–32)
Calcium: 9.4 mg/dL (ref 8.9–10.3)
Chloride: 98 mmol/L (ref 98–111)
Creatinine, Ser: 1.69 mg/dL — ABNORMAL HIGH (ref 0.61–1.24)
GFR calc Af Amer: 47 mL/min — ABNORMAL LOW (ref >60)
GFR calc non Af Amer: 40 mL/min — ABNORMAL LOW (ref >60)
Glucose, Bld: 151 mg/dL — ABNORMAL HIGH (ref 70–99)
Potassium: 4.3 mmol/L (ref 3.5–5.1)
Sodium: 135 mmol/L (ref 135–145)
Total Bilirubin: 0.6 mg/dL (ref 0.3–1.2)
Total Protein: 7.8 g/dL (ref 6.5–8.1)

## 2019-07-12 MED ORDER — OXYCODONE HCL 5 MG PO TABS: 5.0000 mg | ORAL_TABLET | Freq: Four times a day (QID) | ORAL | 0 refills | Status: AC | PRN

## 2019-07-12 MED ORDER — IOHEXOL 300 MG/ML  SOLN
60.0000 mL | Freq: Once | INTRAMUSCULAR | Status: AC | PRN
  Administered 2019-07-12: 60 mL via INTRAVENOUS

## 2019-07-12 MED ORDER — SODIUM CHLORIDE 0.9 % IV SOLN
Freq: Once | INTRAVENOUS | Status: AC
  Administered 2019-07-12: 13:00:00 via INTRAVENOUS
  Filled 2019-07-12: qty 250

## 2019-07-12 MED ORDER — BENZONATATE 100 MG PO CAPS: 100.0000 mg | ORAL_CAPSULE | Freq: Three times a day (TID) | ORAL | 0 refills | Status: DC | PRN

## 2019-07-12 NOTE — Telephone Encounter (Signed)
Daughter called reporting that patient has been "spitting up blood for 4 days from the time he gets up until about 10 at night" Asking what she needs to do. Please advise

## 2019-07-12 NOTE — Telephone Encounter (Signed)
We would be happy to see him in [facility]. Just let us know!

## 2019-07-12 NOTE — Telephone Encounter (Signed)
Patient scheduled to see J Borders NP and labs this morning

## 2019-07-12 NOTE — Progress Notes (Signed)
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  Chief complaint/ Reason for visit- hemoptysis  Interval history-patient presents to the clinic with 5 days of mild hemoptysis.  He says he is coughing up small amounts of pink-tinged phlegm that is progressed some in frequency over the past several days.  He says that the cough is improved with PRN use of benzonatate.  Oxycodone, which he primarily takes at night is also effective.  Patient tells me that he coughs a lot during the day but has no coughing at night.  He denies large amounts of hemoptysis and says that generally it is the size of nickel or quarter.  He has had some shortness of breath primarily with exertion such as climbing stairs.  He denies fever or chills.  Denies other sources of bleeding.   ECOG FS:1 - Symptomatic but completely ambulatory  Review of systems- Review of Systems  Constitutional: Positive for malaise/fatigue. Negative for chills, fever and weight loss.  HENT: Negative for congestion, nosebleeds, sinus pain and sore throat.   Respiratory: Positive for cough, hemoptysis, sputum production and shortness of breath. Negative for wheezing.   Cardiovascular: Negative for chest pain and leg swelling.  Gastrointestinal: Negative for abdominal pain, blood in stool, constipation, diarrhea, melena, nausea and vomiting.  Genitourinary: Negative for dysuria, frequency, hematuria and urgency.  Neurological: Positive for dizziness. Negative for focal weakness and headaches.  Endo/Heme/Allergies: Does not bruise/bleed easily.     Current treatment- supportive  care  No Known Allergies  Past Medical History:  Diagnosis Date  . 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 GERD (gastroesophageal reflux disease)   . Hyperlipemia   . Hypertension   . Non-small cell lung cancer ([place]) 12/2017   surgical resection 02/23/2018.  . Pulmonary nodules 01/13/2018  . Vaccine counseling 12/22/2017    Past Surgical History:  Procedure Laterality Date  . COLONOSCOPY WITH PROPOFOL N/A 02/22/2018   Procedure: COLONOSCOPY WITH PROPOFOL;  Surgeon: Manya Silvas, MD;  Location: [facility] ENDOSCOPY;  Service: Endoscopy;  Laterality: N/A;  . EYE SURGERY Left child  . THORACOTOMY/LOBECTOMY Right 02/23/2018   Procedure: THORACOTOMY/ WEDGE RESECTION;  Surgeon: Nestor Lewandowsky, MD;  Location: [facility] ORS;  Service: Thoracic;  Laterality: Right;  Marland Kitchen VIDEO BRONCHOSCOPY N/A 02/23/2018   Procedure: PREOP BRONCHOSCOPY;  Surgeon: Nestor Lewandowsky, MD;  Location: [facility] ORS;  Service: Thoracic;  Laterality: N/A;    Social History   Socioeconomic History  . Marital status: Married    Spouse name: Not on file  . Number of children: Not on file  . Years of education: Not on file  . Highest education level: Not on file  Occupational History  . Not on file  Social Needs  . Financial resource strain: Not on file  . Food insecurity    Worry: Not on file    Inability: Not on file  . Transportation needs    Medical: Not on file    Non-medical: Not on file  Tobacco Use  . Smoking status: Former Smoker    Packs/day: 1.00    Years: 50.00    Pack years: 50.00    Types: Cigarettes    Quit date:  11/17/2017    Years since quitting: 1.6  . Smokeless tobacco: Former Systems developer    Types: [place] date: 01/15/1989  Substance and Sexual Activity  . Alcohol use: Yes    Comment: very seldom now  . Drug use: Yes    Types: Marijuana    Comment: marijuana at night to help sleep at time  . Sexual activity: Yes  Lifestyle  . Physical activity    Days per week: Not on file    Minutes per  session: Not on file  . Stress: Not on file  Relationships  . Social Herbalist on phone: Not on file    Gets together: Not on file    Attends religious service: Not on file    Active member of club or organization: Not on file    Attends meetings of clubs or organizations: Not on file    Relationship status: Not on file  . Intimate partner violence    Fear of current or ex partner: Not on file    Emotionally abused: Not on file    Physically abused: Not on file    Forced sexual activity: Not on file  Other Topics Concern  . Not on file  Social History Narrative  . Not on file    Family History  Problem Relation Age of Onset  . Arthritis Mother   . COPD Father   . Coronary artery disease Father   . Transient ischemic attack Father   . Alcoholism Father   . Bowel Disease Father   . COPD Sister   . Prostatitis Brother   . Thyroid disease Sister   . Thyroid disease Sister   . Liver cancer Brother   . Diabetes Brother      Current Outpatient Medications:  .  atorvastatin (LIPITOR) 10 MG tablet, Take 10 mg by mouth at bedtime. , Disp: , Rfl:  .  benzonatate (TESSALON) 100 MG capsule, Take 1-2 capsules (100-200 mg total) by mouth 3 (three) times daily as needed for cough., Disp: 20 capsule, Rfl: 0 .  lisinopril (PRINIVIL,ZESTRIL) 20 MG tablet, Take 20 mg by mouth daily., Disp: , Rfl:  .  omeprazole (PRILOSEC) 40 MG capsule, Take by mouth daily., Disp: , Rfl: 11 .  oxyCODONE (OXY IR/ROXICODONE) 5 MG immediate release tablet, Take 1 tablet (5 mg total) by mouth every 6 (six) hours as needed for severe pain., Disp: 30 tablet, Rfl: 0 No current facility-administered medications for this visit.   Facility-Administered Medications Ordered in Other Visits:  .  0.9 %  sodium chloride infusion, , Intravenous, Once, Borders, Kirt Boys, NP  Physical exam:  Vitals:   07/12/19 1153  BP: 90/60  Pulse: (!) 123  Temp: 98.2 F (36.8 C)  TempSrc: Tympanic  SpO2: 95%    Physical Exam Constitutional:      Appearance: Normal appearance.  HENT:     Nose: Nose normal. No congestion or rhinorrhea.     Mouth/Throat:     Mouth: Mucous membranes are moist.     Pharynx: No oropharyngeal exudate or posterior oropharyngeal erythema.  Cardiovascular:     Rate and Rhythm: Normal rate and regular rhythm.     Pulses: Normal pulses.     Heart sounds: Normal heart sounds.  Pulmonary:     Effort: Pulmonary effort is normal. No respiratory distress.     Breath sounds: No wheezing or rhonchi.     Comments: Right side diminished Course lower lobes Chest:  Chest wall: No tenderness.  Abdominal:     General: Abdomen is flat.     Palpations: Abdomen is soft.  Skin:    General: Skin is warm and dry.  Neurological:     General: No focal deficit present.     Mental Status: He is alert and oriented to person, place, and time.      CMP Latest Ref Rng & Units 07/12/2019  Glucose 70 - 99 mg/dL 151(H)  BUN 8 - 23 mg/dL 18  Creatinine 0.61 - 1.24 mg/dL 1.69(H)  Sodium 135 - 145 mmol/L 135  Potassium 3.5 - 5.1 mmol/L 4.3  Chloride 98 - 111 mmol/L 98  CO2 22 - 32 mmol/L 26  Calcium 8.9 - 10.3 mg/dL 9.4  Total Protein 6.5 - 8.1 g/dL 7.8  Total Bilirubin 0.3 - 1.2 mg/dL 0.6  Alkaline Phos 38 - 126 U/L 108  AST 15 - 41 U/L 44(H)  ALT 0 - 44 U/L 28   CBC Latest Ref Rng & Units 07/12/2019  WBC 4.0 - 10.5 K/uL 9.1  Hemoglobin 13.0 - 17.0 g/dL 12.0(L)  Hematocrit 39.0 - 52.0 % 36.6(L)  Platelets 150 - 400 K/uL 423(H)    No images are attached to the encounter.  No results found.  Assessment and plan- Patient is a 68 y.o. 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  1.  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  He remains opposed to the idea of chemotherapy.   Patient saw Dr. Baruch Gouty last week and is pending CT scan later this month.  We will try to obtain CT today to better characterize the cancer.  2.  Hemoptysis -suspect disease progression.  Had a lengthy conversation with patient about options for work-up and treatment.  Patient's daughter participated by phone.  We discussed options of CTA, embolization, referral to pulmonary for consideration of bronchoscopy, or referral back to radiation oncology.  We also discussed supportive care and keeping patient comfortable.  Patient says that he is not interested in invasive procedures.  He would be accepting of additional RT but would otherwise just want to be kept comfortable with medications even if hemoptysis were to worsen.  Case discussed with Dr. Baruch Gouty. Will obtain STAT CT chest today and then have Dr. Baruch Gouty evaluate tomorrow.   -Refill oxycodone and benzonatate today.  PDMP reviewed.  3.  Dehydration -we will give 1 L normal saline in clinic  4.  Goals of care -patient feels like he is declining over time.  He seems to recognize that he could be nearing end-of-life.  We again discussed the option of hospice involvement and patient would like to go ahead and initiate hospice services.  He recognizes that there may be a delay in start of hospice if plan is to pursue RT.  Case and plan discussed with Dr. Tasia Catchings.   Visit Diagnosis 1. Large cell neuroendocrine carcinoma ([place])   2. Hemoptysis   3. Goals of care, counseling/discussion     Patient expressed understanding and was in agreement with this plan. He also understands that He can call clinic at any time with any questions, concerns, or complaints.   Thank you for allowing me to participate in the care of this very pleasant patient.   Time Total: 25 minutes  Visit consisted of counseling and  education dealing with the complex and emotionally intense issues of symptom management and palliative care in the setting of serious and potentially  life-threatening illness.Greater than 50%  of this time was spent counseling and coordinating care related to the above assessment and plan.  Signed by: Altha Harm, PhD, NP-C [phone] (Work Cell)

## 2019-07-13 ENCOUNTER — Other Ambulatory Visit: Payer: Self-pay

## 2019-07-13 ENCOUNTER — Encounter: Payer: Self-pay | Admitting: Radiation Oncology

## 2019-07-13 ENCOUNTER — Ambulatory Visit
Admission: RE | Admit: 2019-07-13 | Discharge: 2019-07-13 | Disposition: A | Discharge status: Home / Self Care | Payer: Medicare Other | Source: Ambulatory Visit | Attending: Radiation Oncology | Admitting: Radiation Oncology

## 2019-07-13 VITALS — BP 137/69 | HR 112 | Temp 97.8°F | Resp 22 | Wt 145.3 lb

## 2019-07-13 DIAGNOSIS — C3491 Malignant neoplasm of unspecified part of right bronchus or lung: Secondary | ICD-10-CM

## 2019-07-13 DIAGNOSIS — C771 Secondary and unspecified malignant neoplasm of intrathoracic lymph nodes: Secondary | ICD-10-CM | POA: Diagnosis not present

## 2019-07-13 DIAGNOSIS — Z923 Personal history of irradiation: Secondary | ICD-10-CM | POA: Insufficient documentation

## 2019-07-13 DIAGNOSIS — Z87891 Personal history of nicotine dependence: Secondary | ICD-10-CM | POA: Insufficient documentation

## 2019-07-13 DIAGNOSIS — R5383 Other fatigue: Secondary | ICD-10-CM | POA: Insufficient documentation

## 2019-07-13 DIAGNOSIS — C3411 Malignant neoplasm of upper lobe, right bronchus or lung: Secondary | ICD-10-CM | POA: Insufficient documentation

## 2019-07-13 NOTE — Progress Notes (Signed)
Radiation Oncology Follow up Note  Name: Joseph Barron   Date:   07/13/2019 MRN:  [id number] DOB: [birth year]    This 68 y.o. male presents to the clinic today for evaluation of hemoptysis with progressive CT findings of persistent and progressive disease in his right chest in patient with known large cell neuroendocrine tumor of the right lung.  REFERRING PROVIDER: Dion Body, MD  HPI: Patient is a 68 year old male who I saw her recently for follow-up.  He was status post wedge resection back in 2019 for a large cell neuroendocrine tumor of his right lung.  He was feeling quite fatigued although specifically denies hemoptysis or chest tightness..  He had a PET CT scan back in May which showed some minor hypermetabolic activity the right lower lobe consistent with recurrent disease.  I sent him for a CT scan which he had yesterday showing new bulky mediastinal subcarinal and right hilar adenopathy with increasing extensive consolidation and thickening in the right hemithorax consistent with progression of disease.  COMPLICATIONS OF TREATMENT: none  FOLLOW UP COMPLIANCE: keeps appointments   PHYSICAL EXAM:  BP 137/69 (BP Location: Left Arm, Patient Position: Sitting)   Pulse (!) 112   Temp 97.8 F (36.6 C) (Tympanic)   Resp (!) 22   Wt 145 lb 4.8 oz (65.9 kg)   BMI 21.46 kg/m  Well-developed well-nourished patient in NAD. HEENT reveals PERLA, EOMI, discs not visualized.  Oral cavity is clear. No oral mucosal lesions are identified. Neck is clear without evidence of cervical or supraclavicular adenopathy. Lungs are clear to A&P. Cardiac examination is essentially unremarkable with regular rate and rhythm without murmur rub or thrill. Abdomen is benign with no organomegaly or masses noted. Motor sensory and DTR levels are equal and symmetric in the upper and lower extremities. Cranial nerves II through XII are grossly intact. Proprioception is intact. No peripheral adenopathy or edema  is identified. No motor or sensory levels are noted. Crude visual fields are within normal range.  RADIOLOGY RESULTS: CT scan reviewed compatible with above-stated findings  PLAN: Present time elected ahead with a palliative course of 3000 cGy in 10 fractions to the area of progressive disease in his right chest.  This will overlie some areas of previous treatment although it should have excellent impact on preventing any further hemoptysis.  Risks and benefits of treatment including possible dysphasia fatigue skin reaction alteration of blood counts and cough all were described in detail to the patient.  I have personally set up and ordered CT simulation for tomorrow.  We will start our treatments as soon as possible.  Patient comprehends my treatment plan well.  I would like to take this opportunity to thank you for allowing me to participate in the care of your patient.Noreene Filbert, MD

## 2019-07-14 ENCOUNTER — Other Ambulatory Visit: Payer: Self-pay

## 2019-07-14 ENCOUNTER — Ambulatory Visit
Admission: RE | Admit: 2019-07-14 | Discharge: 2019-07-14 | Disposition: A | Discharge status: Home / Self Care | Payer: Medicare Other | Source: Ambulatory Visit | Attending: Radiation Oncology | Admitting: Radiation Oncology

## 2019-07-14 ENCOUNTER — Telehealth: Payer: Self-pay | Admitting: *Deleted

## 2019-07-14 DIAGNOSIS — C771 Secondary and unspecified malignant neoplasm of intrathoracic lymph nodes: Secondary | ICD-10-CM | POA: Diagnosis not present

## 2019-07-14 NOTE — Telephone Encounter (Signed)
I called and spoke with daughter by phone. We reviewed the results of the CT scan. She recognizes that there is clear evidence of disease progression. She has also seen a pattern of decline. Plan is for patient to have 10 fractions of RT in attempt to improve his hemoptysis. Afterwards, patient and family would like to start hospice at home. Daughter says that patient does not want to be hospitalized and confirms that he is a DNR and has those signed orders on the refrigerator. All questions answered.

## 2019-07-14 NOTE — Telephone Encounter (Signed)
Daughter Lattie Haw called asking to be called back regarding the mass found on patients recent CT scan [phone]  IMPRESSION: 1. New bulky mediastinal/subcarinal and right hilar adenopathy with increasing extensive heterogeneous consolidation and septal thickening in the right hemithorax, findings consistent with disease progression. 2. Thickening and narrowing of the left lower lobe bronchus, worrisome for metastatic disease. 3. New hepatic metastatic disease. 4. Aortic atherosclerosis (ICD10-170.0). Coronary artery calcification. Associated marked narrowing of the right common carotid artery. 5.  Emphysema (ICD10-J43.9).   Electronically Signed   By: Lorin Picket M.D.   On: 07/13/2019 08:19

## 2019-07-15 DIAGNOSIS — C771 Secondary and unspecified malignant neoplasm of intrathoracic lymph nodes: Secondary | ICD-10-CM | POA: Diagnosis not present

## 2019-07-19 ENCOUNTER — Ambulatory Visit
Admission: RE | Admit: 2019-07-19 | Discharge: 2019-07-19 | Disposition: A | Discharge status: Home / Self Care | Payer: Medicare Other | Source: Ambulatory Visit | Attending: Radiation Oncology | Admitting: Radiation Oncology

## 2019-07-19 ENCOUNTER — Other Ambulatory Visit: Payer: Self-pay

## 2019-07-19 ENCOUNTER — Ambulatory Visit: Payer: Medicare Other

## 2019-07-19 DIAGNOSIS — C771 Secondary and unspecified malignant neoplasm of intrathoracic lymph nodes: Secondary | ICD-10-CM | POA: Diagnosis not present

## 2019-07-20 ENCOUNTER — Ambulatory Visit
Admission: RE | Admit: 2019-07-20 | Discharge: 2019-07-20 | Disposition: A | Discharge status: Home / Self Care | Payer: Medicare Other | Source: Ambulatory Visit | Attending: Radiation Oncology | Admitting: Radiation Oncology

## 2019-07-20 ENCOUNTER — Other Ambulatory Visit: Payer: Self-pay

## 2019-07-20 DIAGNOSIS — C771 Secondary and unspecified malignant neoplasm of intrathoracic lymph nodes: Secondary | ICD-10-CM | POA: Diagnosis not present

## 2019-07-21 ENCOUNTER — Ambulatory Visit: Payer: Medicare Other

## 2019-07-21 ENCOUNTER — Other Ambulatory Visit: Payer: Self-pay

## 2019-07-21 ENCOUNTER — Ambulatory Visit
Admission: RE | Admit: 2019-07-21 | Discharge: 2019-07-21 | Disposition: A | Discharge status: Home / Self Care | Payer: Medicare Other | Source: Ambulatory Visit | Attending: Radiation Oncology | Admitting: Radiation Oncology

## 2019-07-21 DIAGNOSIS — C771 Secondary and unspecified malignant neoplasm of intrathoracic lymph nodes: Secondary | ICD-10-CM | POA: Diagnosis not present

## 2019-07-22 ENCOUNTER — Other Ambulatory Visit: Payer: Self-pay

## 2019-07-22 ENCOUNTER — Ambulatory Visit
Admission: RE | Admit: 2019-07-22 | Discharge: 2019-07-22 | Disposition: A | Discharge status: Home / Self Care | Payer: Medicare Other | Source: Ambulatory Visit | Attending: Radiation Oncology | Admitting: Radiation Oncology

## 2019-07-22 ENCOUNTER — Inpatient Hospital Stay (HOSPITAL_BASED_OUTPATIENT_CLINIC_OR_DEPARTMENT_OTHER): Payer: Medicare Other | Admitting: Hospice and Palliative Medicine

## 2019-07-22 DIAGNOSIS — Z7189 Other specified counseling: Secondary | ICD-10-CM | POA: Diagnosis not present

## 2019-07-22 DIAGNOSIS — C7A8 Other malignant neuroendocrine tumors: Secondary | ICD-10-CM | POA: Diagnosis not present

## 2019-07-22 DIAGNOSIS — Z515 Encounter for palliative care: Secondary | ICD-10-CM | POA: Diagnosis not present

## 2019-07-22 DIAGNOSIS — C771 Secondary and unspecified malignant neoplasm of intrathoracic lymph nodes: Secondary | ICD-10-CM | POA: Diagnosis not present

## 2019-07-22 NOTE — Progress Notes (Signed)
Virtual Visit via Telephone Note  I connected with Joseph Barron on 07/22/19 at 11:30 AM EDT by telephone and verified that I am speaking with the correct person using two identifiers.   I discussed the limitations, risks, security and privacy concerns of performing an evaluation and management service by telephone and the availability of in person appointments. I also discussed with the patient that there may be a patient responsible charge related to this service. The patient expressed understanding and agreed to proceed.   History of Present Illness: Palliative Care consult requested for this 68 y.o. 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  PET scan on 03/22/2019 revealed a hypermetabolic right lower lobe nodularity consistent with metastatic disease and patchy hypermetabolism throughout the right upper lobe, which was unclear if it was related to radiation changes or lymphangitic tumor spread.  Systemic chemotherapy was recommended but patient declined.  He was referred to palliative care to help address goals and manage ongoing symptoms.   Observations/Objective: Patient says he is doing reasonably well.  He continues to have intermittent hemoptysis but says that it is improving with radiation.  He also finds that his cough is better with 3 times daily dosing of benzonatate.  He denies other distressing symptoms or changes.  Assessment and Plan: 1.  Lung cancer -patient has previously made the decision not to pursue treatment and confirmed that decision today.  Will need future hospice involvement following XRT.  Home-based palliative care is following.  2.  Cough -patient reports that his cough is improved with 3 times daily dosing of benzonatate.  We will continue  3.  Hemoptysis -improving with XRT.  XRT treatments were continued through 10/1.  4.  Pain -stable on current regimen of as  needed oxycodone.   Follow Up Instructions: We will plan follow-up telephone visit in 2 weeks   I discussed the assessment and treatment plan with the patient. The patient was provided an opportunity to ask questions and all were answered. The patient agreed with the plan and demonstrated an understanding of the instructions.   The patient was advised to call back or seek an in-person evaluation if the symptoms worsen or if the condition fails to improve as anticipated.  I provided 5 minutes of non-face-to-face time during this encounter.   Irean Hong, NP

## 2019-07-25 ENCOUNTER — Other Ambulatory Visit: Payer: Self-pay

## 2019-07-25 ENCOUNTER — Ambulatory Visit
Admission: RE | Admit: 2019-07-25 | Discharge: 2019-07-25 | Disposition: A | Discharge status: Home / Self Care | Payer: Medicare Other | Source: Ambulatory Visit | Attending: Radiation Oncology | Admitting: Radiation Oncology

## 2019-07-25 DIAGNOSIS — C771 Secondary and unspecified malignant neoplasm of intrathoracic lymph nodes: Secondary | ICD-10-CM | POA: Diagnosis not present

## 2019-07-26 ENCOUNTER — Ambulatory Visit
Admission: RE | Admit: 2019-07-26 | Discharge: 2019-07-26 | Disposition: A | Discharge status: Home / Self Care | Payer: Medicare Other | Source: Ambulatory Visit | Attending: Radiation Oncology | Admitting: Radiation Oncology

## 2019-07-26 ENCOUNTER — Other Ambulatory Visit: Payer: Self-pay

## 2019-07-26 DIAGNOSIS — C771 Secondary and unspecified malignant neoplasm of intrathoracic lymph nodes: Secondary | ICD-10-CM | POA: Diagnosis not present

## 2019-07-26 DIAGNOSIS — C7A8 Other malignant neuroendocrine tumors: Secondary | ICD-10-CM

## 2019-07-27 ENCOUNTER — Ambulatory Visit: Payer: Medicare Other

## 2019-07-27 ENCOUNTER — Inpatient Hospital Stay: Payer: Medicare Other

## 2019-07-27 ENCOUNTER — Ambulatory Visit
Admission: RE | Admit: 2019-07-27 | Discharge: 2019-07-27 | Disposition: A | Discharge status: Home / Self Care | Payer: Medicare Other | Source: Ambulatory Visit | Attending: Radiation Oncology | Admitting: Radiation Oncology

## 2019-07-27 ENCOUNTER — Other Ambulatory Visit: Payer: Self-pay

## 2019-07-27 DIAGNOSIS — C7A8 Other malignant neuroendocrine tumors: Secondary | ICD-10-CM

## 2019-07-27 DIAGNOSIS — C771 Secondary and unspecified malignant neoplasm of intrathoracic lymph nodes: Secondary | ICD-10-CM | POA: Diagnosis not present

## 2019-07-27 DIAGNOSIS — C7A1 Malignant poorly differentiated neuroendocrine tumors: Secondary | ICD-10-CM | POA: Diagnosis not present

## 2019-07-27 LAB — CBC WITH DIFFERENTIAL/PLATELET
Abs Immature Granulocytes: 0.05 10*3/uL (ref 0.00–0.07)
Basophils Absolute: 0.1 10*3/uL (ref 0.0–0.1)
Basophils Relative: 1 %
Eosinophils Absolute: 0.3 10*3/uL (ref 0.0–0.5)
Eosinophils Relative: 4 %
HCT: 36.2 % — ABNORMAL LOW (ref 39.0–52.0)
Hemoglobin: 11.9 g/dL — ABNORMAL LOW (ref 13.0–17.0)
Immature Granulocytes: 1 %
Lymphocytes Relative: 13 %
Lymphs Abs: 1.1 10*3/uL (ref 0.7–4.0)
MCH: 30.3 pg (ref 26.0–34.0)
MCHC: 32.9 g/dL (ref 30.0–36.0)
MCV: 92.1 fL (ref 80.0–100.0)
Monocytes Absolute: 1.1 10*3/uL — ABNORMAL HIGH (ref 0.1–1.0)
Monocytes Relative: 13 %
Neutro Abs: 6 10*3/uL (ref 1.7–7.7)
Neutrophils Relative %: 68 %
Platelets: 468 10*3/uL — ABNORMAL HIGH (ref 150–400)
RBC: 3.93 MIL/uL — ABNORMAL LOW (ref 4.22–5.81)
RDW: 12.6 % (ref 11.5–15.5)
WBC: 8.7 10*3/uL (ref 4.0–10.5)
nRBC: 0 % (ref 0.0–0.2)

## 2019-07-28 ENCOUNTER — Ambulatory Visit
Admission: RE | Admit: 2019-07-28 | Discharge: 2019-07-28 | Disposition: A | Discharge status: Home / Self Care | Payer: Medicare Other | Source: Ambulatory Visit | Attending: Radiation Oncology | Admitting: Radiation Oncology

## 2019-07-28 ENCOUNTER — Other Ambulatory Visit: Payer: Self-pay

## 2019-07-28 DIAGNOSIS — C3411 Malignant neoplasm of upper lobe, right bronchus or lung: Secondary | ICD-10-CM | POA: Insufficient documentation

## 2019-07-28 DIAGNOSIS — C771 Secondary and unspecified malignant neoplasm of intrathoracic lymph nodes: Secondary | ICD-10-CM | POA: Insufficient documentation

## 2019-07-28 DIAGNOSIS — Z923 Personal history of irradiation: Secondary | ICD-10-CM | POA: Insufficient documentation

## 2019-07-28 DIAGNOSIS — Z87891 Personal history of nicotine dependence: Secondary | ICD-10-CM | POA: Diagnosis not present

## 2019-07-28 DIAGNOSIS — R5383 Other fatigue: Secondary | ICD-10-CM | POA: Diagnosis not present

## 2019-07-29 ENCOUNTER — Ambulatory Visit
Admission: RE | Admit: 2019-07-29 | Discharge: 2019-07-29 | Disposition: A | Discharge status: Home / Self Care | Payer: Medicare Other | Source: Ambulatory Visit | Attending: Radiation Oncology | Admitting: Radiation Oncology

## 2019-07-29 ENCOUNTER — Other Ambulatory Visit: Payer: Self-pay

## 2019-07-29 DIAGNOSIS — C771 Secondary and unspecified malignant neoplasm of intrathoracic lymph nodes: Secondary | ICD-10-CM | POA: Diagnosis not present

## 2019-08-01 ENCOUNTER — Other Ambulatory Visit: Payer: Self-pay

## 2019-08-01 ENCOUNTER — Ambulatory Visit: Payer: Medicare Other

## 2019-08-01 ENCOUNTER — Ambulatory Visit
Admission: RE | Admit: 2019-08-01 | Discharge: 2019-08-01 | Disposition: A | Discharge status: Home / Self Care | Payer: Medicare Other | Source: Ambulatory Visit | Attending: Radiation Oncology | Admitting: Radiation Oncology

## 2019-08-01 DIAGNOSIS — C771 Secondary and unspecified malignant neoplasm of intrathoracic lymph nodes: Secondary | ICD-10-CM | POA: Diagnosis not present

## 2019-08-02 ENCOUNTER — Ambulatory Visit: Payer: Medicare Other | Admitting: Radiation Oncology

## 2019-08-02 ENCOUNTER — Ambulatory Visit: Payer: Medicare Other

## 2019-08-03 ENCOUNTER — Ambulatory Visit: Payer: Medicare Other

## 2019-08-04 ENCOUNTER — Ambulatory Visit: Payer: Medicare Other

## 2019-08-05 ENCOUNTER — Inpatient Hospital Stay: Payer: Medicare Other | Attending: Hospice and Palliative Medicine | Admitting: Hospice and Palliative Medicine

## 2019-08-05 ENCOUNTER — Ambulatory Visit: Payer: Medicare Other

## 2019-08-05 DIAGNOSIS — C7A8 Other malignant neuroendocrine tumors: Secondary | ICD-10-CM | POA: Diagnosis not present

## 2019-08-05 DIAGNOSIS — Z515 Encounter for palliative care: Secondary | ICD-10-CM | POA: Diagnosis not present

## 2019-08-05 MED ORDER — BENZONATATE 100 MG PO CAPS: 100.0000 mg | ORAL_CAPSULE | Freq: Three times a day (TID) | ORAL | 0 refills | Status: AC | PRN

## 2019-08-05 NOTE — Progress Notes (Signed)
Virtual Visit via Telephone Note  I connected with Joseph Barron on 08/05/19 at  2:30 PM EDT by telephone and verified that I am speaking with the correct person using two identifiers.   I discussed the limitations, risks, security and privacy concerns of performing an evaluation and management service by telephone and the availability of in person appointments. I also discussed with the patient that there may be a patient responsible charge related to this service. The patient expressed understanding and agreed to proceed.   History of Present Illness: Palliative Care consult requested for this 68 y.o. 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  PET scan on 03/22/2019 revealed a hypermetabolic right lower lobe nodularity consistent with metastatic disease and patchy hypermetabolism throughout the right upper lobe, which was unclear if it was related to radiation changes or lymphangitic tumor spread.  Systemic chemotherapy was recommended but patient declined.  He was referred to palliative care to help address goals and manage ongoing symptoms.   Observations/Objective: Patient says he is doing reasonably well.  He finished XRT earlier this week. Patient denies significant changes or concerns. His hemoptysis improved through XRT. He continues to have a persistent cough. Pain is reportedly stable.   Assessment and Plan: 1.  Lung cancer -patient has previously made the decision not to pursue treatment and confirmed that decision today. He would like to pursue hospice at home. Will refer to hospice.  2.  Cough -patient says the benzonatate was helping but cough has worsened after he ran out of meds. Will refill.  3.  Hemoptysis -improved with XRT. He finished treatments this week.  4.  Pain -stable on current regimen of as needed oxycodone.   Follow Up Instructions: As needed   I discussed the  assessment and treatment plan with the patient. The patient was provided an opportunity to ask questions and all were answered. The patient agreed with the plan and demonstrated an understanding of the instructions.   The patient was advised to call back or seek an in-person evaluation if the symptoms worsen or if the condition fails to improve as anticipated.  I provided 10 minutes of non-face-to-face time during this encounter.   Irean Hong, NP

## 2019-08-08 ENCOUNTER — Telehealth: Payer: Self-pay | Admitting: *Deleted

## 2019-08-08 NOTE — Telephone Encounter (Signed)
Mitzi with Hospice called reporting that patient has a "very swollen tonsil causing difficulty swallowing and is painful". States no fever. Requesting antibiotics to be called in for possible infection. Please advise

## 2019-08-09 ENCOUNTER — Telehealth: Payer: Self-pay | Admitting: *Deleted

## 2019-08-09 NOTE — Telephone Encounter (Signed)
Okay with O2 and nebs per hospice protocol.

## 2019-08-09 NOTE — Telephone Encounter (Signed)
itzi called stating she wants to order O2 and Neb machine for this patient, but she does not have signed protocol yet so she needs a VERBAL ORDER to order these for him. Please call order to [phone]

## 2019-08-09 NOTE — Telephone Encounter (Signed)
I spoke with Joseph Barron. Orders given for amoxicillin (250mg /50mL) 500mg  BID x 10 days.

## 2019-08-09 NOTE — Telephone Encounter (Signed)
Informed Joseph Barron that the signed protocol orders were faxed today.

## 2019-08-10 ENCOUNTER — Telehealth: Payer: Self-pay | Admitting: *Deleted

## 2019-08-10 NOTE — Telephone Encounter (Signed)
Mitzi with hospice called asking if patient Lisinopril can be reduced from 20 mg to 10 mg per day current b/p 102/62. Also requesting a low dose steroid for him. Please advise

## 2019-08-10 NOTE — Telephone Encounter (Signed)
I called and spoke with Mitzi. Orders given. Okay with lisinopril 10mg  daily. Monitor BP daily. May try dexamethasone 2mg  daily for shortness of breath, appetite, and fatigue.

## 2019-09-02 ENCOUNTER — Ambulatory Visit: Payer: Medicare Other | Attending: Radiation Oncology | Admitting: Radiation Oncology

## 2019-09-27 DEATH — deceased

## 2019-09-29 ENCOUNTER — Telehealth: Payer: Self-pay

## 2019-09-29 NOTE — Telephone Encounter (Signed)
Received death certificate from [place] and [employer] services. Certificate completed and mailed to [place] as requested, in envelope provided.
